# Patient Record
Sex: Female | Born: 1935
Health system: Southern US, Community
[De-identification: ages and names within clinical notes are randomized; demographics above are authoritative.]

## PROBLEM LIST (undated history)

## (undated) DIAGNOSIS — G2581 Restless legs syndrome: Secondary | ICD-10-CM

## (undated) DIAGNOSIS — H9190 Unspecified hearing loss, unspecified ear: Secondary | ICD-10-CM

## (undated) DIAGNOSIS — Z889 Allergy status to unspecified drugs, medicaments and biological substances status: Secondary | ICD-10-CM

## (undated) DIAGNOSIS — R413 Other amnesia: Secondary | ICD-10-CM

## (undated) DIAGNOSIS — K589 Irritable bowel syndrome without diarrhea: Secondary | ICD-10-CM

## (undated) DIAGNOSIS — M1990 Unspecified osteoarthritis, unspecified site: Secondary | ICD-10-CM

## (undated) DIAGNOSIS — R1310 Dysphagia, unspecified: Secondary | ICD-10-CM

## (undated) DIAGNOSIS — H029 Unspecified disorder of eyelid: Secondary | ICD-10-CM

## (undated) DIAGNOSIS — G5600 Carpal tunnel syndrome, unspecified upper limb: Secondary | ICD-10-CM

## (undated) DIAGNOSIS — I10 Essential (primary) hypertension: Secondary | ICD-10-CM

## (undated) DIAGNOSIS — K219 Gastro-esophageal reflux disease without esophagitis: Secondary | ICD-10-CM

## (undated) DIAGNOSIS — G63 Polyneuropathy in diseases classified elsewhere: Secondary | ICD-10-CM

## (undated) DIAGNOSIS — C801 Malignant (primary) neoplasm, unspecified: Secondary | ICD-10-CM

## (undated) DIAGNOSIS — I959 Hypotension, unspecified: Secondary | ICD-10-CM

## (undated) DIAGNOSIS — I4891 Unspecified atrial fibrillation: Secondary | ICD-10-CM

## (undated) DIAGNOSIS — R269 Unspecified abnormalities of gait and mobility: Secondary | ICD-10-CM

## (undated) HISTORY — DX: Irritable bowel syndrome, unspecified: K58.9

## (undated) HISTORY — PX: BLADDER ASPIRATION: SHX472

## (undated) HISTORY — PX: SQUAMOUS CELL CARCINOMA EXCISION: SHX2433

## (undated) HISTORY — PX: EYE SURGERY: SHX253

## (undated) HISTORY — DX: Restless legs syndrome: G25.81

## (undated) HISTORY — DX: Carpal tunnel syndrome, unspecified upper limb: G56.00

## (undated) HISTORY — DX: Polyneuropathy in diseases classified elsewhere: G63

## (undated) HISTORY — DX: Unspecified abnormalities of gait and mobility: R26.9

## (undated) HISTORY — DX: Hypotension, unspecified: I95.9

## (undated) HISTORY — DX: Other amnesia: R41.3

## (undated) HISTORY — PX: CARPAL TUNNEL RELEASE: SHX101

## (undated) HISTORY — DX: Dysphagia, unspecified: R13.10

## (undated) HISTORY — PX: ABDOMINAL HYSTERECTOMY: SHX81

---

## 1989-10-28 HISTORY — PX: OTHER SURGICAL HISTORY: SHX169

## 1998-05-23 ENCOUNTER — Ambulatory Visit (HOSPITAL_COMMUNITY): Admission: RE | Admit: 1998-05-23 | Discharge: 1998-05-23 | Payer: Self-pay | Admitting: Gastroenterology

## 1998-11-22 ENCOUNTER — Encounter: Payer: Self-pay | Admitting: *Deleted

## 1998-11-22 ENCOUNTER — Ambulatory Visit (HOSPITAL_COMMUNITY): Admission: RE | Admit: 1998-11-22 | Discharge: 1998-11-22 | Payer: Self-pay | Admitting: *Deleted

## 1998-11-29 ENCOUNTER — Ambulatory Visit (HOSPITAL_BASED_OUTPATIENT_CLINIC_OR_DEPARTMENT_OTHER): Admission: RE | Admit: 1998-11-29 | Discharge: 1998-11-29 | Payer: Self-pay | Admitting: Orthopedic Surgery

## 1998-12-12 ENCOUNTER — Encounter: Admission: RE | Admit: 1998-12-12 | Discharge: 1999-01-25 | Payer: Self-pay | Admitting: Orthopedic Surgery

## 1999-07-09 ENCOUNTER — Ambulatory Visit (HOSPITAL_COMMUNITY): Admission: RE | Admit: 1999-07-09 | Discharge: 1999-07-09 | Payer: Self-pay

## 2000-06-16 ENCOUNTER — Encounter: Payer: Self-pay | Admitting: Internal Medicine

## 2000-06-16 ENCOUNTER — Encounter: Admission: RE | Admit: 2000-06-16 | Discharge: 2000-06-16 | Payer: Self-pay | Admitting: Internal Medicine

## 2001-01-13 ENCOUNTER — Other Ambulatory Visit: Admission: RE | Admit: 2001-01-13 | Discharge: 2001-01-13 | Payer: Self-pay | Admitting: Internal Medicine

## 2001-06-18 ENCOUNTER — Encounter: Admission: RE | Admit: 2001-06-18 | Discharge: 2001-06-18 | Payer: Self-pay | Admitting: Internal Medicine

## 2001-06-18 ENCOUNTER — Encounter: Payer: Self-pay | Admitting: Internal Medicine

## 2001-09-02 ENCOUNTER — Encounter: Admission: RE | Admit: 2001-09-02 | Discharge: 2001-09-02 | Payer: Self-pay | Admitting: Urology

## 2001-09-02 ENCOUNTER — Encounter: Payer: Self-pay | Admitting: Urology

## 2001-09-03 ENCOUNTER — Ambulatory Visit (HOSPITAL_BASED_OUTPATIENT_CLINIC_OR_DEPARTMENT_OTHER): Admission: RE | Admit: 2001-09-03 | Discharge: 2001-09-03 | Payer: Self-pay | Admitting: Urology

## 2002-06-21 ENCOUNTER — Encounter: Payer: Self-pay | Admitting: Internal Medicine

## 2002-06-21 ENCOUNTER — Encounter: Admission: RE | Admit: 2002-06-21 | Discharge: 2002-06-21 | Payer: Self-pay | Admitting: Internal Medicine

## 2002-12-15 ENCOUNTER — Encounter: Admission: RE | Admit: 2002-12-15 | Discharge: 2002-12-15 | Payer: Self-pay | Admitting: Internal Medicine

## 2002-12-15 ENCOUNTER — Encounter: Payer: Self-pay | Admitting: Internal Medicine

## 2004-01-11 ENCOUNTER — Ambulatory Visit (HOSPITAL_BASED_OUTPATIENT_CLINIC_OR_DEPARTMENT_OTHER): Admission: RE | Admit: 2004-01-11 | Discharge: 2004-01-11 | Payer: Self-pay | Admitting: Internal Medicine

## 2004-08-16 ENCOUNTER — Encounter (INDEPENDENT_AMBULATORY_CARE_PROVIDER_SITE_OTHER): Payer: Self-pay | Admitting: *Deleted

## 2004-08-16 ENCOUNTER — Ambulatory Visit (HOSPITAL_COMMUNITY): Admission: RE | Admit: 2004-08-16 | Discharge: 2004-08-16 | Payer: Self-pay | Admitting: Gastroenterology

## 2004-10-01 ENCOUNTER — Encounter: Admission: RE | Admit: 2004-10-01 | Discharge: 2004-10-01 | Payer: Self-pay | Admitting: Gastroenterology

## 2004-10-09 ENCOUNTER — Ambulatory Visit (HOSPITAL_COMMUNITY): Admission: RE | Admit: 2004-10-09 | Discharge: 2004-10-09 | Payer: Self-pay | Admitting: Gastroenterology

## 2006-07-02 ENCOUNTER — Encounter: Admission: RE | Admit: 2006-07-02 | Discharge: 2006-07-31 | Payer: Self-pay | Admitting: Orthopedic Surgery

## 2006-08-01 ENCOUNTER — Encounter: Admission: RE | Admit: 2006-08-01 | Discharge: 2006-09-02 | Payer: Self-pay | Admitting: Orthopedic Surgery

## 2006-09-03 ENCOUNTER — Encounter: Admission: RE | Admit: 2006-09-03 | Discharge: 2006-12-02 | Payer: Self-pay | Admitting: Orthopedic Surgery

## 2006-11-07 ENCOUNTER — Encounter: Admission: RE | Admit: 2006-11-07 | Discharge: 2006-11-07 | Payer: Self-pay | Admitting: Internal Medicine

## 2007-05-05 ENCOUNTER — Encounter: Admission: RE | Admit: 2007-05-05 | Discharge: 2007-08-03 | Payer: Self-pay | Admitting: Orthopedic Surgery

## 2008-07-25 ENCOUNTER — Encounter: Admission: RE | Admit: 2008-07-25 | Discharge: 2008-07-25 | Payer: Self-pay | Admitting: Orthopedic Surgery

## 2008-07-26 ENCOUNTER — Ambulatory Visit (HOSPITAL_BASED_OUTPATIENT_CLINIC_OR_DEPARTMENT_OTHER): Admission: RE | Admit: 2008-07-26 | Discharge: 2008-07-26 | Payer: Self-pay | Admitting: Orthopedic Surgery

## 2008-09-21 ENCOUNTER — Ambulatory Visit (HOSPITAL_COMMUNITY): Admission: RE | Admit: 2008-09-21 | Discharge: 2008-09-21 | Payer: Self-pay | Admitting: Gastroenterology

## 2009-08-17 ENCOUNTER — Ambulatory Visit (HOSPITAL_COMMUNITY): Admission: RE | Admit: 2009-08-17 | Discharge: 2009-08-17 | Payer: Self-pay | Admitting: Gastroenterology

## 2010-05-14 ENCOUNTER — Encounter: Admission: RE | Admit: 2010-05-14 | Discharge: 2010-05-14 | Payer: Self-pay | Admitting: Gastroenterology

## 2010-05-18 ENCOUNTER — Emergency Department (HOSPITAL_COMMUNITY): Admission: EM | Admit: 2010-05-18 | Discharge: 2010-05-18 | Payer: Self-pay | Admitting: Emergency Medicine

## 2010-06-21 ENCOUNTER — Ambulatory Visit (HOSPITAL_COMMUNITY): Admission: RE | Admit: 2010-06-21 | Discharge: 2010-06-21 | Payer: Self-pay | Admitting: Gastroenterology

## 2011-03-12 NOTE — Op Note (Signed)
NAMESELESTE, TALLMAN                   ACCOUNT NO.:  [id number]   MEDICAL RECORD NO.:  [id number]          PATIENT TYPE:  AMB   LOCATION:  DSC                          FACILITY:  [facility]   PHYSICIAN:  Katy Fitch. Sypher, M.D. DATE OF BIRTH:  09-23-1925   DATE OF PROCEDURE:  07/26/2008  DATE OF DISCHARGE:                               OPERATIVE REPORT   PREOPERATIVE DIAGNOSES:  1. Entrapment neuropathy, median nerve, right carpal tunnel.  2. Entrapment neuropathy, median nerve, left carpal tunnel.   POSTOPERATIVE DIAGNOSES:  1. Entrapment neuropathy, median nerve, right carpal tunnel.  2. Entrapment neuropathy, median nerve, left carpal tunnel.   OPERATION:  1. Release of right transcarpal ligament.  2. Injection of left ulnar bursa for palliation of carpal tunnel      syndrome.   OPERATIONS:  Katy Fitch. Sypher, MD   ASSISTANT:  Marveen Reeks Dasnoit, PA-C   ANESTHESIA:  General by LMA.   SUPERVISING ANESTHESIOLOGIST:  Quita Skye. Krista Blue, MD   INDICATIONS:  Johan Antonacci is an 77 year old woman referred through the  courtesy of Dr. Avie Echevaria for evaluation and management of hand  numbness and confirmed carpal tunnel syndrome.   She had been evaluated at [facility] and had  electrodiagnostic studies confirming severe right carpal tunnel syndrome  and moderately severe left carpal tunnel syndrome.   After informed consent, she is brought to the operating room at this  time.   PROCEDURE:  Vincenzina Jagoda was brought to the operating room and placed in  the supine position upon the operating table.   Preoperatively during interviewed in the holding area, she requested  that we proceed with injection of her left ulnar bursa due to  significant left-sided symptoms.  After informed consent, she was  brought to room #2 and placed in the supine position upon the operating  table and under Dr. Robina Ade direct supervision, general anesthesia by  LMA technique was induced.   The  right arm was prepped with Betadine soap and solution and sterilely  draped.  A pneumatic tourniquet was applied to the proximal right  brachium.  Following exsanguination of right arm with an Esmarch  bandage, the arterial tourniquet was inflated to 250 mmHg.   Procedure commenced with a short incision in the line of the ring finger  in the palm.  The subcutaneous tissues were carefully divided via the  palmar fascia.  This was split longitudinally to reveal a common sensory  branch of the median nerve.  These were followed back to the transcarpal  ligament, which was gently isolated with median nerve using a Child psychotherapist.   Once the transverse carpal ligament was isolated, it was released with  scissors subcutaneously extending into the distal forearm.   This widely opened carpal canal.   No mass or other pigments were noted.   Bleeding points along the margin of the released ligament were  electrocauterized with bipolar current followed by repair of the skin  with intradermal 3-0 Prolene suture.   A compressive dressing was applied.  Attention was then directed to  the  left wrist.   With the fingers in flexion, the ulnar bursa was palpated and a needle  was placed at a 40-degree angle in the line of the ring finger 1.5 cm  above the distal wrist flexion crease.  Needle was aspirated followed by  injection of a mixture of Depo-Medrol 1 mL 40 mg/mL and 2% lidocaine 1  mL.  This was injected without complication in the ulnar bursa.  The  wound was then dressed with a Band-Aid.   The right wrist was immobilized with a volar plaster splint maintaining  the wrist in 5 degrees of dorsiflexion.   There were no apparent complications.   Ms. Carlson was transferred to the recovery room with stable signs.   We looked forward to seeing her back in our office in 1 week for  dressing change and initiation of an excise program.      Katy Fitch. Sypher, M.D.  Electronically  Signed     RVS/MEDQ  D:  07/26/2008  T:  07/27/2008  Job:  [id number]   cc:   Evie Lacks, MD  Georgann Housekeeper, MD

## 2011-03-12 NOTE — Op Note (Signed)
NAMEMERCEDEES, CONVERY NO.:  [id number]   MEDICAL RECORD NO.:  [id number]          PATIENT TYPE:  AMB   LOCATION:  ENDO                         FACILITY:  [facility]   PHYSICIAN:  Danise Edge, M.D.   DATE OF BIRTH:  [birth year]   DATE OF PROCEDURE:  09/21/2008  DATE OF DISCHARGE:                               OPERATIVE REPORT   REFERRING PHYSICIAN:  Georgann Housekeeper, MD   PROCEDURE:  Esophagogastroduodenoscopy with Savary esophageal dilation.   PROCEDURE INDICATIONS:  Stacey Jefferson is an 77 year old female born on  11/11/1924.  Ms. Stacey Jefferson has chronic gastroesophageal reflux  complicated by a benign peptic stricture at the esophagogastric junction  which required esophageal dilation on October 09, 2004.  She has  redeveloped dysphagia and is scheduled to undergo a repeat  esophagogastroduodenoscopy with Savary esophageal dilation.   MEDICATION ALLERGIES:  SULFA, MACRODANTIN, BIAXIN, and CELEBREX.   PAST MEDICAL HISTORY:  1. Hypertension.  2. Anxiety.  3. Chronic insomnia.  4. Restless leg syndrome.  5. Gastroesophageal reflux disease complicated by a benign distal      esophageal stricture.  6. Urinary incontinence.  7. Osteoarthritis.  8. Sensory neuropathy of the legs.  9. Right rotator cuff surgery.  10.Hysterectomy.  11.Foot surgery.  12.Eye surgery.  13.Carpal tunnel syndrome.  14.Irritable bowel syndrome.   HABITS:  Stacey Jefferson does not smoke cigarettes or consume alcohol.   ENDOSCOPIST:  Danise Edge, MD   PREMEDICATION:  Fentanyl 50 mcg and Versed 6 mg.   PROCEDURE IN DETAIL:  After obtaining informed consent, [name] was  placed in the left lateral decubitus position.  I administered  intravenous fentanyl and intravenous Versed to achieve conscious  sedation for the procedure.  The patient's blood pressure, oxygen  saturation, and cardiac rhythm were monitored throughout the procedure  and documented in the medical record.   The Pentax  gastroscope was passed through the posterior hypopharynx and  advanced to the esophagogastric junction without difficulty.  The  hypopharynx, larynx, and vocal cords appeared normal.   Esophagoscopy:  The proximal and mid segments of the esophagus appeared  normal.  There is a tight benign peptic stricture at the esophagogastric  junction preventing passage of the Pentax gastroscope into the stomach.  Under fluoroscopic guidance, the Savary dilator wire was passed through  the gastroscope and the tip of the guidewire advanced to the distal  gastric antrum.  Under fluoroscopic guidance, the 10 mm, 11 mm, and 12.8  mm Savary dilators were passed with minimal resistance.  The guidewire  was removed.  The Pentax gastroscope was passed through the posterior  hypopharynx into the proximal esophagus.  The proximal mid and lower  segments of the esophagus appeared normal.  There was blood at the  dilated distal esophageal stricture.  I was able to traverse the  stricture with the Pentax gastroscope and enter the stomach.  There was  no endoscopic evidence for the presence of erosive esophagitis, Barrett  esophagus, or esophageal cancer.   Gastroscopy:  Retroflex view of the gastric cardia and fundus was  normal.  The diaphragmatic hiatus was somewhat patulous.  The gastric  body, antrum, and pylorus appeared normal.   Duodenoscopy:  The duodenal bulb and descending duodenum appeared  normal.   ASSESSMENT:  Chronic gastroesophageal reflux disease complicated by a  benign peptic stricture at the esophagogastric junction dilated with the  10 mm, 11 mm, and 12.8 mm Savary dilators.           ______________________________  Danise Edge, M.D.     MJ/MEDQ  D:  09/21/2008  T:  09/21/2008  Job:  [id number]   cc:   Georgann Housekeeper, MD

## 2011-03-15 NOTE — Op Note (Signed)
NAMESTEFANNIE, Stacey Jefferson NO.:  [id number]   MEDICAL RECORD NO.:  [id number]          PATIENT TYPE:  AMB   LOCATION:  ENDO                         FACILITY:  [facility]   PHYSICIAN:  Danise Edge, M.D.   DATE OF BIRTH:  [birth year]   DATE OF PROCEDURE:  08/16/2004  DATE OF DISCHARGE:                                 OPERATIVE REPORT   PROCEDURE:  Colonoscopy with polypectomy.   INDICATIONS FOR PROCEDURE:  Ms. Stacey Jefferson is a 77 year old female born  04-28-1925.  She is scheduled to undergo a colonoscopy with  polypectomy.   SURGEON:  Danise Edge, M.D.   PREMEDICATION:  Versed 4.5 mg, Demerol 50 mg.   DESCRIPTION OF PROCEDURE:  After obtaining informed consent, Stacey Jefferson was  placed in the left lateral decubitus position.  I administered intravenous  Demerol and intravenous Versed to achieve conscious sedation for the  procedure.  The patient's blood pressure, oxygen saturation, and cardiac  rhythm were monitored throughout the procedure and documented in the medical  record.   Anal inspection and digital rectal exam were normal.  The Olympus adjustable  pediatric colonoscope was introduced into the rectum and advanced to the  cecum.  The colonic preparation for the exam today was satisfactory.  To get  adequate visualization of the mucosa, vigorous water irrigation of the  mucosa was performed.   Rectum:  Normal.   Sigmoid colon and descending colon:  At 45 cm from the anal verge, a 3-mm  sessile polyp was removed with the electrocautery snare and submitted for  pathological interpretation.   Splenic flexure:  Normal.   Transverse colon:  Normal.   Hepatic flexure:  Normal.   Ascending colon:  Normal.   Cecum and ileocecal valve:  Normal.   ASSESSMENT:  A small polyp was removed from the sigmoid colon at 45 cm from  the anal verge; otherwise, normal proctocolonoscopy to the cecum.      MJ/MEDQ  D:  08/16/2004  T:  08/16/2004  Job:  [id number]   cc:   Georgann Housekeeper, MD  [address]  [place]  [place] [address]  Fax: [phone]   Genene Churn. Love, M.D.  [address]  [place]  [place] [address]  Fax: [phone]

## 2011-03-15 NOTE — Op Note (Signed)
[facility]  Patient:    Stacey Jefferson, Stacey Jefferson Visit Number: [id number] MRN: [id number]          Service Type: NES Location: NESC Attending Physician:  Monica Becton Dictated by:   Claudette Laws, M.D. Proc. Date: 09/03/01 Admit Date:  09/03/2001                             Operative Report  PREOPERATIVE DIAGNOSIS:  Urinary incontinence.  POSTOPERATIVE DIAGNOSIS:  Urinary incontinence.  OPERATION/PROCEDURE:  Cystoscopy and transurethral injection bladder neck with Collagen (2 syringes).  SURGEON:  Claudette Laws, M.D.  DESCRIPTION OF PROCEDURE:  The patient was prepped and draped in the dorsolithotomy position under LMA anesthesia.  Cystoscopy was performed showing a normal appearing bladder.  No tumors, normal ureteral orifices, no suspicious areas.  She had a rather wide-open bladder neck.  Then using both the Olympus transurethral needle and the Bard collagens cannula stabilizing needle through a 25 French Olympus cystoscope, transurethral incision of the bladder neck with collagen was performed in a circumferential manner using 2 syringefuls of the collagen.  This seemed to coapt the bladder neck rather nicely.  We then emptied the bladder with the 14 French Robinson catheter and she was taken back to the recovery room in satisfactory condition. Dictated by:   Claudette Laws, M.D. Attending Physician:  Monica Becton DD:  09/03/01 TD:  09/04/01 Job: [id number]

## 2011-03-15 NOTE — Op Note (Signed)
NAMEFRANCESCA, Jefferson NO.:  [id number]   MEDICAL RECORD NO.:  [id number]          PATIENT TYPE:  AMB   LOCATION:  ENDO                         FACILITY:  [facility]   PHYSICIAN:  Danise Edge, M.D.   DATE OF BIRTH:  12-Nov-1924   DATE OF PROCEDURE:  10/09/2004  DATE OF DISCHARGE:                                 OPERATIVE REPORT   PROCEDURE:  Esophageal dilation.   INDICATIONS FOR PROCEDURE:  Stacey Jefferson is a 77 year old female born  11-10-1924.  Stacey Jefferson denies heartburn.  When she developed solid  food dysphagia, she underwent a barium tablet swallow which revealed a  distal esophageal stricture estimated to be 12 mm in diameter.  She last  underwent esophageal dilation for a benign peptic stricture at the  esophagogastric junction in 1999.   ENDOSCOPIST:  Danise Edge, M.D.   PREMEDICATION:  Versed 5 mg, Demerol 40 mg.   DESCRIPTION OF PROCEDURE:  After obtaining informed consent, Stacey Jefferson was  placed in the left lateral decubitus position.  I administered intravenous  Demerol and intravenous Versed to achieve conscious sedation for the  procedure.  The patient's blood pressure, oxygen saturation, and cardiac  rhythm were monitored throughout the procedure and documented in the medical  record.   The Olympus gastroscope was passed through the posterior hypopharynx into  the proximal esophagus without difficulty.  The hypopharynx, larynx, and  vocal cords appeared normal.   Esophagoscopy:  The proximal and midsegments of the esophagus appeared  normal.  There was a benign peptic stricture at the esophagogastric  junction.  There was no endoscopic evidence for the presence of erosive  esophagitis, Barrett's esophagus, or esophageal cancer.   Gastroscopy:  Stacey Jefferson had a small hiatal hernia.  Retroflex view of the  gastric cardia and fundus was normal.  The gastric body, antrum, and pylorus  appeared normal.   Duodenoscopy:  The duodenal bulb and  descending duodenum appeared normal.   Savary esophageal dilation:  The Savary dilator wire was passed through the  endoscope, and the tip of the guidewire was advanced to the distal gastric  antrum as confirmed endoscopically.  Fluoroscopy was not required for  esophageal dilation.  The 10-mm, 11-mm, and 12-mm Savary dilators were  passed over the guidewire without resistance.  After passage of the 12-mm  Savary dilator, I repeated esophagogastroscopy which confirmed dilation of  the benign peptic stricture at the esophagogastric junction and no gastric  trauma due to the guidewire.  It appeared safe to continue on with two more  Savary dilators.  The guidewire was replaced, and the 14-mm and 15-mm Savary  dilators were passed without resistance.  Repeat esophagogastroscopy  confirmed satisfactory dilation of the benign peptic stricture at the  esophagogastric junction and no gastric trauma due to the guidewire.   ASSESSMENT:  Benign peptic stricture at the esophagogastric junction  associated with a hiatal hernia, dilated with a 10-mm, 11-mm, 12-mm, 14-mm,  and 15-mm Savary dilators.      MJ/MEDQ  D:  10/09/2004  T:  10/09/2004  Job:  [id number]   cc:   Georgann Housekeeper, MD  [address]  [place]  [place] [address]  Fax: [phone]

## 2011-04-05 ENCOUNTER — Ambulatory Visit: Payer: Medicare Other | Attending: Ophthalmology | Admitting: Occupational Therapy

## 2011-04-05 DIAGNOSIS — R5381 Other malaise: Secondary | ICD-10-CM | POA: Insufficient documentation

## 2011-04-05 DIAGNOSIS — R6889 Other general symptoms and signs: Secondary | ICD-10-CM | POA: Insufficient documentation

## 2011-04-05 DIAGNOSIS — H4420 Degenerative myopia, unspecified eye: Secondary | ICD-10-CM | POA: Insufficient documentation

## 2011-04-05 DIAGNOSIS — IMO0001 Reserved for inherently not codable concepts without codable children: Secondary | ICD-10-CM | POA: Insufficient documentation

## 2011-07-25 ENCOUNTER — Ambulatory Visit (HOSPITAL_COMMUNITY)
Admission: RE | Admit: 2011-07-25 | Discharge: 2011-07-25 | Disposition: A | Discharge status: Home / Self Care | Payer: Medicare Other | Source: Ambulatory Visit | Attending: Gastroenterology | Admitting: Gastroenterology

## 2011-07-25 ENCOUNTER — Other Ambulatory Visit: Payer: Self-pay | Admitting: Gastroenterology

## 2011-07-25 DIAGNOSIS — K222 Esophageal obstruction: Secondary | ICD-10-CM | POA: Insufficient documentation

## 2011-07-25 DIAGNOSIS — K219 Gastro-esophageal reflux disease without esophagitis: Secondary | ICD-10-CM | POA: Insufficient documentation

## 2011-07-25 DIAGNOSIS — Z860100 Personal history of colon polyps, unspecified: Secondary | ICD-10-CM | POA: Insufficient documentation

## 2011-07-25 DIAGNOSIS — I10 Essential (primary) hypertension: Secondary | ICD-10-CM | POA: Insufficient documentation

## 2011-07-25 DIAGNOSIS — K589 Irritable bowel syndrome without diarrhea: Secondary | ICD-10-CM | POA: Insufficient documentation

## 2011-07-25 DIAGNOSIS — G589 Mononeuropathy, unspecified: Secondary | ICD-10-CM | POA: Insufficient documentation

## 2011-07-25 DIAGNOSIS — F0390 Unspecified dementia without behavioral disturbance: Secondary | ICD-10-CM | POA: Insufficient documentation

## 2011-07-25 DIAGNOSIS — R1310 Dysphagia, unspecified: Secondary | ICD-10-CM | POA: Insufficient documentation

## 2011-07-25 DIAGNOSIS — K2960 Other gastritis without bleeding: Secondary | ICD-10-CM | POA: Insufficient documentation

## 2011-07-25 DIAGNOSIS — M1990 Unspecified osteoarthritis, unspecified site: Secondary | ICD-10-CM | POA: Insufficient documentation

## 2011-07-25 DIAGNOSIS — J309 Allergic rhinitis, unspecified: Secondary | ICD-10-CM | POA: Insufficient documentation

## 2011-07-25 DIAGNOSIS — G2581 Restless legs syndrome: Secondary | ICD-10-CM | POA: Insufficient documentation

## 2011-07-28 NOTE — Op Note (Signed)
  NAMEMARISELDA, BADALAMENTI NO.:  [id number]  MEDICAL RECORD NO.:  [id number]  LOCATION:  WLEN                         FACILITY:  [facility]  PHYSICIAN:  Danise Edge, M.D.   DATE OF BIRTH:  [birth year]  DATE OF PROCEDURE:  07/25/2011 DATE OF DISCHARGE:                              OPERATIVE REPORT   REFERRING PHYSICIAN:  Georgann Housekeeper, MD  PROCEDURE:  Esophagogastroduodenoscopy with balloon dilation of a distal esophageal stricture and distal esophageal biopsy.  HISTORY:  Ms. Stacey Jefferson is an 77 year old female born on [birth year].  The patient has a benign peptic stricture at the esophagogastric junction which has required multiple esophageal dilations.  ENDOSCOPIST:  Danise Edge, MD  PREMEDICATION:  Versed 3 mg, fentanyl 25 mcg.  PROCEDURE IN DETAIL:  The patient was placed in the left lateral decubitus position.  The Pentax gastroscope was passed through the posterior hypopharynx into the proximal esophagus without difficulty. The hypopharynx, larynx, and vocal cords appeared normal.  Esophagoscopy:  The proximal mid segments of the esophagus appeared normal.  The squamocolumnar junction was noted at 40 cm from the incisor teeth.  The benign peptic stricture extends from approximately 37 cm to 40 cm from the incisor teeth.  Using the CRE balloon dilator, the benign- appearing distal esophageal stricture was dilated from 13 mm to 15 mm. Postesophageal balloon dilation biopsies were taken from the esophageal stricture.  Gastroscopy:  Retroflex view of the gastric cardia and fundus was normal.  The gastric body appeared normal.  There were scattered circular erosions in the gastric antrum.  The pylorus appeared normal.  Duodenoscopy:  The duodenal bulb and descending duodenum appeared normal.  ASSESSMENT:  Benign distal esophageal stricture dilated with the CRE balloon dilator from 13 mm to 15 mm.  Esophageal stricture biopsies  were performed.          ______________________________ Danise Edge, M.D.    MJ/MEDQ  D:  07/25/2011  T:  07/25/2011  Job:  [id number]  Electronically Signed by Danise Edge M.D. on 07/28/2011 01:44:41 PM

## 2011-07-29 LAB — BASIC METABOLIC PANEL
BUN: 15
Chloride: 101
Potassium: 4.6

## 2011-07-29 LAB — POCT HEMOGLOBIN-HEMACUE: Hemoglobin: 13.1

## 2011-11-26 DIAGNOSIS — M1710 Unilateral primary osteoarthritis, unspecified knee: Secondary | ICD-10-CM | POA: Diagnosis not present

## 2011-12-05 DIAGNOSIS — M75120 Complete rotator cuff tear or rupture of unspecified shoulder, not specified as traumatic: Secondary | ICD-10-CM | POA: Diagnosis not present

## 2011-12-05 DIAGNOSIS — M1710 Unilateral primary osteoarthritis, unspecified knee: Secondary | ICD-10-CM | POA: Diagnosis not present

## 2011-12-05 DIAGNOSIS — S43429A Sprain of unspecified rotator cuff capsule, initial encounter: Secondary | ICD-10-CM | POA: Diagnosis not present

## 2011-12-13 DIAGNOSIS — M1710 Unilateral primary osteoarthritis, unspecified knee: Secondary | ICD-10-CM | POA: Diagnosis not present

## 2011-12-17 DIAGNOSIS — M1990 Unspecified osteoarthritis, unspecified site: Secondary | ICD-10-CM | POA: Diagnosis not present

## 2011-12-17 DIAGNOSIS — G609 Hereditary and idiopathic neuropathy, unspecified: Secondary | ICD-10-CM | POA: Diagnosis not present

## 2011-12-17 DIAGNOSIS — G2589 Other specified extrapyramidal and movement disorders: Secondary | ICD-10-CM | POA: Diagnosis not present

## 2011-12-17 DIAGNOSIS — I10 Essential (primary) hypertension: Secondary | ICD-10-CM | POA: Diagnosis not present

## 2011-12-17 DIAGNOSIS — M899 Disorder of bone, unspecified: Secondary | ICD-10-CM | POA: Diagnosis not present

## 2011-12-17 DIAGNOSIS — Z1331 Encounter for screening for depression: Secondary | ICD-10-CM | POA: Diagnosis not present

## 2011-12-17 DIAGNOSIS — K219 Gastro-esophageal reflux disease without esophagitis: Secondary | ICD-10-CM | POA: Diagnosis not present

## 2011-12-17 DIAGNOSIS — F0390 Unspecified dementia without behavioral disturbance: Secondary | ICD-10-CM | POA: Diagnosis not present

## 2011-12-24 DIAGNOSIS — G63 Polyneuropathy in diseases classified elsewhere: Secondary | ICD-10-CM | POA: Diagnosis not present

## 2012-03-11 DIAGNOSIS — K222 Esophageal obstruction: Secondary | ICD-10-CM | POA: Diagnosis not present

## 2012-03-11 DIAGNOSIS — R1310 Dysphagia, unspecified: Secondary | ICD-10-CM | POA: Diagnosis not present

## 2012-03-31 DIAGNOSIS — M1710 Unilateral primary osteoarthritis, unspecified knee: Secondary | ICD-10-CM | POA: Diagnosis not present

## 2012-04-14 DIAGNOSIS — G2589 Other specified extrapyramidal and movement disorders: Secondary | ICD-10-CM | POA: Diagnosis not present

## 2012-04-14 DIAGNOSIS — M899 Disorder of bone, unspecified: Secondary | ICD-10-CM | POA: Diagnosis not present

## 2012-04-14 DIAGNOSIS — F0390 Unspecified dementia without behavioral disturbance: Secondary | ICD-10-CM | POA: Diagnosis not present

## 2012-04-14 DIAGNOSIS — K219 Gastro-esophageal reflux disease without esophagitis: Secondary | ICD-10-CM | POA: Diagnosis not present

## 2012-04-14 DIAGNOSIS — I10 Essential (primary) hypertension: Secondary | ICD-10-CM | POA: Diagnosis not present

## 2012-04-14 DIAGNOSIS — R0602 Shortness of breath: Secondary | ICD-10-CM | POA: Diagnosis not present

## 2012-04-14 DIAGNOSIS — M949 Disorder of cartilage, unspecified: Secondary | ICD-10-CM | POA: Diagnosis not present

## 2012-05-06 DIAGNOSIS — I10 Essential (primary) hypertension: Secondary | ICD-10-CM | POA: Diagnosis not present

## 2012-05-06 DIAGNOSIS — R609 Edema, unspecified: Secondary | ICD-10-CM | POA: Diagnosis not present

## 2012-05-06 DIAGNOSIS — R0989 Other specified symptoms and signs involving the circulatory and respiratory systems: Secondary | ICD-10-CM | POA: Diagnosis not present

## 2012-05-06 DIAGNOSIS — R0609 Other forms of dyspnea: Secondary | ICD-10-CM | POA: Diagnosis not present

## 2012-06-03 DIAGNOSIS — L821 Other seborrheic keratosis: Secondary | ICD-10-CM | POA: Diagnosis not present

## 2012-06-03 DIAGNOSIS — L0889 Other specified local infections of the skin and subcutaneous tissue: Secondary | ICD-10-CM | POA: Diagnosis not present

## 2012-06-03 DIAGNOSIS — D485 Neoplasm of uncertain behavior of skin: Secondary | ICD-10-CM | POA: Diagnosis not present

## 2012-06-03 DIAGNOSIS — C44721 Squamous cell carcinoma of skin of unspecified lower limb, including hip: Secondary | ICD-10-CM | POA: Diagnosis not present

## 2012-06-03 DIAGNOSIS — I872 Venous insufficiency (chronic) (peripheral): Secondary | ICD-10-CM | POA: Diagnosis not present

## 2012-06-16 DIAGNOSIS — M1710 Unilateral primary osteoarthritis, unspecified knee: Secondary | ICD-10-CM | POA: Diagnosis not present

## 2012-06-22 DIAGNOSIS — C44721 Squamous cell carcinoma of skin of unspecified lower limb, including hip: Secondary | ICD-10-CM | POA: Diagnosis not present

## 2012-06-22 DIAGNOSIS — L905 Scar conditions and fibrosis of skin: Secondary | ICD-10-CM | POA: Diagnosis not present

## 2012-06-23 DIAGNOSIS — G2581 Restless legs syndrome: Secondary | ICD-10-CM | POA: Diagnosis not present

## 2012-06-23 DIAGNOSIS — G63 Polyneuropathy in diseases classified elsewhere: Secondary | ICD-10-CM | POA: Diagnosis not present

## 2012-06-23 DIAGNOSIS — R413 Other amnesia: Secondary | ICD-10-CM | POA: Diagnosis not present

## 2012-06-25 DIAGNOSIS — M1710 Unilateral primary osteoarthritis, unspecified knee: Secondary | ICD-10-CM | POA: Diagnosis not present

## 2012-07-02 DIAGNOSIS — M1710 Unilateral primary osteoarthritis, unspecified knee: Secondary | ICD-10-CM | POA: Diagnosis not present

## 2012-07-17 DIAGNOSIS — J189 Pneumonia, unspecified organism: Secondary | ICD-10-CM | POA: Diagnosis not present

## 2012-07-21 DIAGNOSIS — J209 Acute bronchitis, unspecified: Secondary | ICD-10-CM | POA: Diagnosis not present

## 2012-07-28 DIAGNOSIS — L608 Other nail disorders: Secondary | ICD-10-CM | POA: Diagnosis not present

## 2012-07-28 DIAGNOSIS — R05 Cough: Secondary | ICD-10-CM | POA: Diagnosis not present

## 2012-07-28 DIAGNOSIS — G609 Hereditary and idiopathic neuropathy, unspecified: Secondary | ICD-10-CM | POA: Diagnosis not present

## 2012-07-28 DIAGNOSIS — L84 Corns and callosities: Secondary | ICD-10-CM | POA: Diagnosis not present

## 2012-08-11 DIAGNOSIS — Z23 Encounter for immunization: Secondary | ICD-10-CM | POA: Diagnosis not present

## 2012-08-11 DIAGNOSIS — G609 Hereditary and idiopathic neuropathy, unspecified: Secondary | ICD-10-CM | POA: Diagnosis not present

## 2012-08-11 DIAGNOSIS — K219 Gastro-esophageal reflux disease without esophagitis: Secondary | ICD-10-CM | POA: Diagnosis not present

## 2012-08-11 DIAGNOSIS — I10 Essential (primary) hypertension: Secondary | ICD-10-CM | POA: Diagnosis not present

## 2012-08-11 DIAGNOSIS — G2589 Other specified extrapyramidal and movement disorders: Secondary | ICD-10-CM | POA: Diagnosis not present

## 2012-08-11 DIAGNOSIS — F0390 Unspecified dementia without behavioral disturbance: Secondary | ICD-10-CM | POA: Diagnosis not present

## 2012-08-26 DIAGNOSIS — K589 Irritable bowel syndrome without diarrhea: Secondary | ICD-10-CM | POA: Diagnosis not present

## 2012-08-26 DIAGNOSIS — R197 Diarrhea, unspecified: Secondary | ICD-10-CM | POA: Diagnosis not present

## 2012-09-02 DIAGNOSIS — K222 Esophageal obstruction: Secondary | ICD-10-CM | POA: Diagnosis not present

## 2012-09-08 DIAGNOSIS — L820 Inflamed seborrheic keratosis: Secondary | ICD-10-CM | POA: Diagnosis not present

## 2012-09-08 DIAGNOSIS — L570 Actinic keratosis: Secondary | ICD-10-CM | POA: Diagnosis not present

## 2012-09-08 DIAGNOSIS — D485 Neoplasm of uncertain behavior of skin: Secondary | ICD-10-CM | POA: Diagnosis not present

## 2012-09-08 DIAGNOSIS — Z85828 Personal history of other malignant neoplasm of skin: Secondary | ICD-10-CM | POA: Diagnosis not present

## 2012-09-08 DIAGNOSIS — D0470 Carcinoma in situ of skin of unspecified lower limb, including hip: Secondary | ICD-10-CM | POA: Diagnosis not present

## 2012-09-22 DIAGNOSIS — D0470 Carcinoma in situ of skin of unspecified lower limb, including hip: Secondary | ICD-10-CM | POA: Diagnosis not present

## 2012-09-22 DIAGNOSIS — L89309 Pressure ulcer of unspecified buttock, unspecified stage: Secondary | ICD-10-CM | POA: Diagnosis not present

## 2012-09-30 DIAGNOSIS — I10 Essential (primary) hypertension: Secondary | ICD-10-CM | POA: Diagnosis not present

## 2012-09-30 DIAGNOSIS — K219 Gastro-esophageal reflux disease without esophagitis: Secondary | ICD-10-CM | POA: Diagnosis not present

## 2012-09-30 DIAGNOSIS — R32 Unspecified urinary incontinence: Secondary | ICD-10-CM | POA: Diagnosis not present

## 2012-09-30 DIAGNOSIS — M1990 Unspecified osteoarthritis, unspecified site: Secondary | ICD-10-CM | POA: Diagnosis not present

## 2012-09-30 DIAGNOSIS — R1310 Dysphagia, unspecified: Secondary | ICD-10-CM | POA: Diagnosis not present

## 2012-09-30 DIAGNOSIS — R269 Unspecified abnormalities of gait and mobility: Secondary | ICD-10-CM | POA: Diagnosis not present

## 2012-10-01 DIAGNOSIS — R269 Unspecified abnormalities of gait and mobility: Secondary | ICD-10-CM | POA: Diagnosis not present

## 2012-10-01 DIAGNOSIS — R1314 Dysphagia, pharyngoesophageal phase: Secondary | ICD-10-CM | POA: Diagnosis not present

## 2012-10-01 DIAGNOSIS — I10 Essential (primary) hypertension: Secondary | ICD-10-CM | POA: Diagnosis not present

## 2012-10-01 DIAGNOSIS — M1990 Unspecified osteoarthritis, unspecified site: Secondary | ICD-10-CM | POA: Diagnosis not present

## 2012-10-01 DIAGNOSIS — R32 Unspecified urinary incontinence: Secondary | ICD-10-CM | POA: Diagnosis not present

## 2012-10-01 DIAGNOSIS — R1310 Dysphagia, unspecified: Secondary | ICD-10-CM | POA: Diagnosis not present

## 2012-10-01 DIAGNOSIS — K219 Gastro-esophageal reflux disease without esophagitis: Secondary | ICD-10-CM | POA: Diagnosis not present

## 2012-10-02 DIAGNOSIS — R32 Unspecified urinary incontinence: Secondary | ICD-10-CM | POA: Diagnosis not present

## 2012-10-02 DIAGNOSIS — R1310 Dysphagia, unspecified: Secondary | ICD-10-CM | POA: Diagnosis not present

## 2012-10-02 DIAGNOSIS — M1990 Unspecified osteoarthritis, unspecified site: Secondary | ICD-10-CM | POA: Diagnosis not present

## 2012-10-02 DIAGNOSIS — K219 Gastro-esophageal reflux disease without esophagitis: Secondary | ICD-10-CM | POA: Diagnosis not present

## 2012-10-02 DIAGNOSIS — R269 Unspecified abnormalities of gait and mobility: Secondary | ICD-10-CM | POA: Diagnosis not present

## 2012-10-02 DIAGNOSIS — I10 Essential (primary) hypertension: Secondary | ICD-10-CM | POA: Diagnosis not present

## 2012-10-05 DIAGNOSIS — R1310 Dysphagia, unspecified: Secondary | ICD-10-CM | POA: Diagnosis not present

## 2012-10-05 DIAGNOSIS — K219 Gastro-esophageal reflux disease without esophagitis: Secondary | ICD-10-CM | POA: Diagnosis not present

## 2012-10-05 DIAGNOSIS — I10 Essential (primary) hypertension: Secondary | ICD-10-CM | POA: Diagnosis not present

## 2012-10-05 DIAGNOSIS — R269 Unspecified abnormalities of gait and mobility: Secondary | ICD-10-CM | POA: Diagnosis not present

## 2012-10-05 DIAGNOSIS — R32 Unspecified urinary incontinence: Secondary | ICD-10-CM | POA: Diagnosis not present

## 2012-10-05 DIAGNOSIS — M1990 Unspecified osteoarthritis, unspecified site: Secondary | ICD-10-CM | POA: Diagnosis not present

## 2012-10-07 DIAGNOSIS — M1990 Unspecified osteoarthritis, unspecified site: Secondary | ICD-10-CM | POA: Diagnosis not present

## 2012-10-07 DIAGNOSIS — R32 Unspecified urinary incontinence: Secondary | ICD-10-CM | POA: Diagnosis not present

## 2012-10-07 DIAGNOSIS — R1310 Dysphagia, unspecified: Secondary | ICD-10-CM | POA: Diagnosis not present

## 2012-10-07 DIAGNOSIS — R269 Unspecified abnormalities of gait and mobility: Secondary | ICD-10-CM | POA: Diagnosis not present

## 2012-10-07 DIAGNOSIS — I10 Essential (primary) hypertension: Secondary | ICD-10-CM | POA: Diagnosis not present

## 2012-10-07 DIAGNOSIS — K219 Gastro-esophageal reflux disease without esophagitis: Secondary | ICD-10-CM | POA: Diagnosis not present

## 2012-10-08 DIAGNOSIS — M1990 Unspecified osteoarthritis, unspecified site: Secondary | ICD-10-CM | POA: Diagnosis not present

## 2012-10-08 DIAGNOSIS — R32 Unspecified urinary incontinence: Secondary | ICD-10-CM | POA: Diagnosis not present

## 2012-10-08 DIAGNOSIS — R269 Unspecified abnormalities of gait and mobility: Secondary | ICD-10-CM | POA: Diagnosis not present

## 2012-10-08 DIAGNOSIS — R1310 Dysphagia, unspecified: Secondary | ICD-10-CM | POA: Diagnosis not present

## 2012-10-08 DIAGNOSIS — I10 Essential (primary) hypertension: Secondary | ICD-10-CM | POA: Diagnosis not present

## 2012-10-08 DIAGNOSIS — K219 Gastro-esophageal reflux disease without esophagitis: Secondary | ICD-10-CM | POA: Diagnosis not present

## 2012-10-09 DIAGNOSIS — M1990 Unspecified osteoarthritis, unspecified site: Secondary | ICD-10-CM | POA: Diagnosis not present

## 2012-10-09 DIAGNOSIS — R269 Unspecified abnormalities of gait and mobility: Secondary | ICD-10-CM | POA: Diagnosis not present

## 2012-10-09 DIAGNOSIS — K219 Gastro-esophageal reflux disease without esophagitis: Secondary | ICD-10-CM | POA: Diagnosis not present

## 2012-10-09 DIAGNOSIS — R32 Unspecified urinary incontinence: Secondary | ICD-10-CM | POA: Diagnosis not present

## 2012-10-09 DIAGNOSIS — R1310 Dysphagia, unspecified: Secondary | ICD-10-CM | POA: Diagnosis not present

## 2012-10-09 DIAGNOSIS — I10 Essential (primary) hypertension: Secondary | ICD-10-CM | POA: Diagnosis not present

## 2012-10-12 DIAGNOSIS — M1990 Unspecified osteoarthritis, unspecified site: Secondary | ICD-10-CM | POA: Diagnosis not present

## 2012-10-12 DIAGNOSIS — R269 Unspecified abnormalities of gait and mobility: Secondary | ICD-10-CM | POA: Diagnosis not present

## 2012-10-12 DIAGNOSIS — I10 Essential (primary) hypertension: Secondary | ICD-10-CM | POA: Diagnosis not present

## 2012-10-12 DIAGNOSIS — K219 Gastro-esophageal reflux disease without esophagitis: Secondary | ICD-10-CM | POA: Diagnosis not present

## 2012-10-12 DIAGNOSIS — R1310 Dysphagia, unspecified: Secondary | ICD-10-CM | POA: Diagnosis not present

## 2012-10-12 DIAGNOSIS — R32 Unspecified urinary incontinence: Secondary | ICD-10-CM | POA: Diagnosis not present

## 2012-10-14 DIAGNOSIS — K219 Gastro-esophageal reflux disease without esophagitis: Secondary | ICD-10-CM | POA: Diagnosis not present

## 2012-10-14 DIAGNOSIS — R32 Unspecified urinary incontinence: Secondary | ICD-10-CM | POA: Diagnosis not present

## 2012-10-14 DIAGNOSIS — R269 Unspecified abnormalities of gait and mobility: Secondary | ICD-10-CM | POA: Diagnosis not present

## 2012-10-14 DIAGNOSIS — M1990 Unspecified osteoarthritis, unspecified site: Secondary | ICD-10-CM | POA: Diagnosis not present

## 2012-10-14 DIAGNOSIS — I10 Essential (primary) hypertension: Secondary | ICD-10-CM | POA: Diagnosis not present

## 2012-10-14 DIAGNOSIS — R1310 Dysphagia, unspecified: Secondary | ICD-10-CM | POA: Diagnosis not present

## 2012-10-16 DIAGNOSIS — K219 Gastro-esophageal reflux disease without esophagitis: Secondary | ICD-10-CM | POA: Diagnosis not present

## 2012-10-16 DIAGNOSIS — R269 Unspecified abnormalities of gait and mobility: Secondary | ICD-10-CM | POA: Diagnosis not present

## 2012-10-16 DIAGNOSIS — M1990 Unspecified osteoarthritis, unspecified site: Secondary | ICD-10-CM | POA: Diagnosis not present

## 2012-10-16 DIAGNOSIS — I10 Essential (primary) hypertension: Secondary | ICD-10-CM | POA: Diagnosis not present

## 2012-10-16 DIAGNOSIS — R1310 Dysphagia, unspecified: Secondary | ICD-10-CM | POA: Diagnosis not present

## 2012-10-16 DIAGNOSIS — R32 Unspecified urinary incontinence: Secondary | ICD-10-CM | POA: Diagnosis not present

## 2012-10-19 DIAGNOSIS — R32 Unspecified urinary incontinence: Secondary | ICD-10-CM | POA: Diagnosis not present

## 2012-10-19 DIAGNOSIS — M1990 Unspecified osteoarthritis, unspecified site: Secondary | ICD-10-CM | POA: Diagnosis not present

## 2012-10-19 DIAGNOSIS — K219 Gastro-esophageal reflux disease without esophagitis: Secondary | ICD-10-CM | POA: Diagnosis not present

## 2012-10-19 DIAGNOSIS — R1310 Dysphagia, unspecified: Secondary | ICD-10-CM | POA: Diagnosis not present

## 2012-10-19 DIAGNOSIS — R269 Unspecified abnormalities of gait and mobility: Secondary | ICD-10-CM | POA: Diagnosis not present

## 2012-10-19 DIAGNOSIS — I10 Essential (primary) hypertension: Secondary | ICD-10-CM | POA: Diagnosis not present

## 2012-10-22 DIAGNOSIS — G63 Polyneuropathy in diseases classified elsewhere: Secondary | ICD-10-CM | POA: Diagnosis not present

## 2012-10-22 DIAGNOSIS — R1310 Dysphagia, unspecified: Secondary | ICD-10-CM | POA: Diagnosis not present

## 2012-10-22 DIAGNOSIS — R1110 Vomiting, unspecified: Secondary | ICD-10-CM | POA: Diagnosis not present

## 2012-10-22 DIAGNOSIS — R634 Abnormal weight loss: Secondary | ICD-10-CM | POA: Diagnosis not present

## 2012-10-23 DIAGNOSIS — M1990 Unspecified osteoarthritis, unspecified site: Secondary | ICD-10-CM | POA: Diagnosis not present

## 2012-10-23 DIAGNOSIS — K219 Gastro-esophageal reflux disease without esophagitis: Secondary | ICD-10-CM | POA: Diagnosis not present

## 2012-10-23 DIAGNOSIS — R269 Unspecified abnormalities of gait and mobility: Secondary | ICD-10-CM | POA: Diagnosis not present

## 2012-10-23 DIAGNOSIS — I10 Essential (primary) hypertension: Secondary | ICD-10-CM | POA: Diagnosis not present

## 2012-10-23 DIAGNOSIS — R1310 Dysphagia, unspecified: Secondary | ICD-10-CM | POA: Diagnosis not present

## 2012-10-23 DIAGNOSIS — R32 Unspecified urinary incontinence: Secondary | ICD-10-CM | POA: Diagnosis not present

## 2012-10-26 DIAGNOSIS — M1990 Unspecified osteoarthritis, unspecified site: Secondary | ICD-10-CM | POA: Diagnosis not present

## 2012-10-26 DIAGNOSIS — I10 Essential (primary) hypertension: Secondary | ICD-10-CM | POA: Diagnosis not present

## 2012-10-26 DIAGNOSIS — R269 Unspecified abnormalities of gait and mobility: Secondary | ICD-10-CM | POA: Diagnosis not present

## 2012-10-26 DIAGNOSIS — R32 Unspecified urinary incontinence: Secondary | ICD-10-CM | POA: Diagnosis not present

## 2012-10-26 DIAGNOSIS — K219 Gastro-esophageal reflux disease without esophagitis: Secondary | ICD-10-CM | POA: Diagnosis not present

## 2012-10-26 DIAGNOSIS — R1310 Dysphagia, unspecified: Secondary | ICD-10-CM | POA: Diagnosis not present

## 2012-10-27 DIAGNOSIS — I10 Essential (primary) hypertension: Secondary | ICD-10-CM | POA: Diagnosis not present

## 2012-10-27 DIAGNOSIS — R269 Unspecified abnormalities of gait and mobility: Secondary | ICD-10-CM | POA: Diagnosis not present

## 2012-10-27 DIAGNOSIS — M1990 Unspecified osteoarthritis, unspecified site: Secondary | ICD-10-CM | POA: Diagnosis not present

## 2012-10-27 DIAGNOSIS — R1310 Dysphagia, unspecified: Secondary | ICD-10-CM | POA: Diagnosis not present

## 2012-10-27 DIAGNOSIS — R32 Unspecified urinary incontinence: Secondary | ICD-10-CM | POA: Diagnosis not present

## 2012-10-27 DIAGNOSIS — K219 Gastro-esophageal reflux disease without esophagitis: Secondary | ICD-10-CM | POA: Diagnosis not present

## 2012-10-30 DIAGNOSIS — R269 Unspecified abnormalities of gait and mobility: Secondary | ICD-10-CM | POA: Diagnosis not present

## 2012-10-30 DIAGNOSIS — R32 Unspecified urinary incontinence: Secondary | ICD-10-CM | POA: Diagnosis not present

## 2012-10-30 DIAGNOSIS — R1310 Dysphagia, unspecified: Secondary | ICD-10-CM | POA: Diagnosis not present

## 2012-10-30 DIAGNOSIS — K219 Gastro-esophageal reflux disease without esophagitis: Secondary | ICD-10-CM | POA: Diagnosis not present

## 2012-10-30 DIAGNOSIS — M1990 Unspecified osteoarthritis, unspecified site: Secondary | ICD-10-CM | POA: Diagnosis not present

## 2012-10-30 DIAGNOSIS — I10 Essential (primary) hypertension: Secondary | ICD-10-CM | POA: Diagnosis not present

## 2012-11-03 DIAGNOSIS — R269 Unspecified abnormalities of gait and mobility: Secondary | ICD-10-CM | POA: Diagnosis not present

## 2012-11-03 DIAGNOSIS — M1990 Unspecified osteoarthritis, unspecified site: Secondary | ICD-10-CM | POA: Diagnosis not present

## 2012-11-03 DIAGNOSIS — R32 Unspecified urinary incontinence: Secondary | ICD-10-CM | POA: Diagnosis not present

## 2012-11-03 DIAGNOSIS — R1310 Dysphagia, unspecified: Secondary | ICD-10-CM | POA: Diagnosis not present

## 2012-11-03 DIAGNOSIS — I10 Essential (primary) hypertension: Secondary | ICD-10-CM | POA: Diagnosis not present

## 2012-11-03 DIAGNOSIS — K219 Gastro-esophageal reflux disease without esophagitis: Secondary | ICD-10-CM | POA: Diagnosis not present

## 2012-11-04 DIAGNOSIS — K219 Gastro-esophageal reflux disease without esophagitis: Secondary | ICD-10-CM | POA: Diagnosis not present

## 2012-11-04 DIAGNOSIS — R1310 Dysphagia, unspecified: Secondary | ICD-10-CM | POA: Diagnosis not present

## 2012-11-04 DIAGNOSIS — R269 Unspecified abnormalities of gait and mobility: Secondary | ICD-10-CM | POA: Diagnosis not present

## 2012-11-04 DIAGNOSIS — I10 Essential (primary) hypertension: Secondary | ICD-10-CM | POA: Diagnosis not present

## 2012-11-04 DIAGNOSIS — R32 Unspecified urinary incontinence: Secondary | ICD-10-CM | POA: Diagnosis not present

## 2012-11-04 DIAGNOSIS — M1990 Unspecified osteoarthritis, unspecified site: Secondary | ICD-10-CM | POA: Diagnosis not present

## 2012-11-06 DIAGNOSIS — R1310 Dysphagia, unspecified: Secondary | ICD-10-CM | POA: Diagnosis not present

## 2012-11-06 DIAGNOSIS — I10 Essential (primary) hypertension: Secondary | ICD-10-CM | POA: Diagnosis not present

## 2012-11-06 DIAGNOSIS — K219 Gastro-esophageal reflux disease without esophagitis: Secondary | ICD-10-CM | POA: Diagnosis not present

## 2012-11-06 DIAGNOSIS — R269 Unspecified abnormalities of gait and mobility: Secondary | ICD-10-CM | POA: Diagnosis not present

## 2012-11-06 DIAGNOSIS — R32 Unspecified urinary incontinence: Secondary | ICD-10-CM | POA: Diagnosis not present

## 2012-11-06 DIAGNOSIS — M1990 Unspecified osteoarthritis, unspecified site: Secondary | ICD-10-CM | POA: Diagnosis not present

## 2012-11-11 DIAGNOSIS — R32 Unspecified urinary incontinence: Secondary | ICD-10-CM | POA: Diagnosis not present

## 2012-11-11 DIAGNOSIS — K219 Gastro-esophageal reflux disease without esophagitis: Secondary | ICD-10-CM | POA: Diagnosis not present

## 2012-11-11 DIAGNOSIS — R1310 Dysphagia, unspecified: Secondary | ICD-10-CM | POA: Diagnosis not present

## 2012-11-11 DIAGNOSIS — M1990 Unspecified osteoarthritis, unspecified site: Secondary | ICD-10-CM | POA: Diagnosis not present

## 2012-11-11 DIAGNOSIS — R269 Unspecified abnormalities of gait and mobility: Secondary | ICD-10-CM | POA: Diagnosis not present

## 2012-11-11 DIAGNOSIS — I10 Essential (primary) hypertension: Secondary | ICD-10-CM | POA: Diagnosis not present

## 2012-11-12 DIAGNOSIS — R1310 Dysphagia, unspecified: Secondary | ICD-10-CM | POA: Diagnosis not present

## 2012-11-12 DIAGNOSIS — M1990 Unspecified osteoarthritis, unspecified site: Secondary | ICD-10-CM | POA: Diagnosis not present

## 2012-11-12 DIAGNOSIS — K219 Gastro-esophageal reflux disease without esophagitis: Secondary | ICD-10-CM | POA: Diagnosis not present

## 2012-11-12 DIAGNOSIS — I10 Essential (primary) hypertension: Secondary | ICD-10-CM | POA: Diagnosis not present

## 2012-11-12 DIAGNOSIS — R269 Unspecified abnormalities of gait and mobility: Secondary | ICD-10-CM | POA: Diagnosis not present

## 2012-11-12 DIAGNOSIS — R32 Unspecified urinary incontinence: Secondary | ICD-10-CM | POA: Diagnosis not present

## 2012-11-16 DIAGNOSIS — M1990 Unspecified osteoarthritis, unspecified site: Secondary | ICD-10-CM | POA: Diagnosis not present

## 2012-11-16 DIAGNOSIS — R32 Unspecified urinary incontinence: Secondary | ICD-10-CM | POA: Diagnosis not present

## 2012-11-16 DIAGNOSIS — I10 Essential (primary) hypertension: Secondary | ICD-10-CM | POA: Diagnosis not present

## 2012-11-16 DIAGNOSIS — R1310 Dysphagia, unspecified: Secondary | ICD-10-CM | POA: Diagnosis not present

## 2012-11-16 DIAGNOSIS — K219 Gastro-esophageal reflux disease without esophagitis: Secondary | ICD-10-CM | POA: Diagnosis not present

## 2012-11-16 DIAGNOSIS — R269 Unspecified abnormalities of gait and mobility: Secondary | ICD-10-CM | POA: Diagnosis not present

## 2012-11-18 DIAGNOSIS — K219 Gastro-esophageal reflux disease without esophagitis: Secondary | ICD-10-CM | POA: Diagnosis not present

## 2012-11-18 DIAGNOSIS — R32 Unspecified urinary incontinence: Secondary | ICD-10-CM | POA: Diagnosis not present

## 2012-11-18 DIAGNOSIS — R1310 Dysphagia, unspecified: Secondary | ICD-10-CM | POA: Diagnosis not present

## 2012-11-18 DIAGNOSIS — M1990 Unspecified osteoarthritis, unspecified site: Secondary | ICD-10-CM | POA: Diagnosis not present

## 2012-11-18 DIAGNOSIS — I10 Essential (primary) hypertension: Secondary | ICD-10-CM | POA: Diagnosis not present

## 2012-11-18 DIAGNOSIS — R269 Unspecified abnormalities of gait and mobility: Secondary | ICD-10-CM | POA: Diagnosis not present

## 2012-11-19 DIAGNOSIS — I10 Essential (primary) hypertension: Secondary | ICD-10-CM | POA: Diagnosis not present

## 2012-11-19 DIAGNOSIS — R269 Unspecified abnormalities of gait and mobility: Secondary | ICD-10-CM | POA: Diagnosis not present

## 2012-11-19 DIAGNOSIS — M1990 Unspecified osteoarthritis, unspecified site: Secondary | ICD-10-CM | POA: Diagnosis not present

## 2012-11-19 DIAGNOSIS — R32 Unspecified urinary incontinence: Secondary | ICD-10-CM | POA: Diagnosis not present

## 2012-11-19 DIAGNOSIS — R1310 Dysphagia, unspecified: Secondary | ICD-10-CM | POA: Diagnosis not present

## 2012-11-19 DIAGNOSIS — K219 Gastro-esophageal reflux disease without esophagitis: Secondary | ICD-10-CM | POA: Diagnosis not present

## 2012-11-24 DIAGNOSIS — R1310 Dysphagia, unspecified: Secondary | ICD-10-CM | POA: Diagnosis not present

## 2012-11-24 DIAGNOSIS — R269 Unspecified abnormalities of gait and mobility: Secondary | ICD-10-CM | POA: Diagnosis not present

## 2012-11-24 DIAGNOSIS — I10 Essential (primary) hypertension: Secondary | ICD-10-CM | POA: Diagnosis not present

## 2012-11-24 DIAGNOSIS — R32 Unspecified urinary incontinence: Secondary | ICD-10-CM | POA: Diagnosis not present

## 2012-11-24 DIAGNOSIS — M1990 Unspecified osteoarthritis, unspecified site: Secondary | ICD-10-CM | POA: Diagnosis not present

## 2012-11-24 DIAGNOSIS — K219 Gastro-esophageal reflux disease without esophagitis: Secondary | ICD-10-CM | POA: Diagnosis not present

## 2012-11-25 DIAGNOSIS — R1310 Dysphagia, unspecified: Secondary | ICD-10-CM | POA: Diagnosis not present

## 2012-11-25 DIAGNOSIS — M1990 Unspecified osteoarthritis, unspecified site: Secondary | ICD-10-CM | POA: Diagnosis not present

## 2012-11-25 DIAGNOSIS — I10 Essential (primary) hypertension: Secondary | ICD-10-CM | POA: Diagnosis not present

## 2012-11-25 DIAGNOSIS — R32 Unspecified urinary incontinence: Secondary | ICD-10-CM | POA: Diagnosis not present

## 2012-11-25 DIAGNOSIS — R269 Unspecified abnormalities of gait and mobility: Secondary | ICD-10-CM | POA: Diagnosis not present

## 2012-11-25 DIAGNOSIS — K219 Gastro-esophageal reflux disease without esophagitis: Secondary | ICD-10-CM | POA: Diagnosis not present

## 2012-12-15 DIAGNOSIS — G609 Hereditary and idiopathic neuropathy, unspecified: Secondary | ICD-10-CM | POA: Diagnosis not present

## 2012-12-15 DIAGNOSIS — K219 Gastro-esophageal reflux disease without esophagitis: Secondary | ICD-10-CM | POA: Diagnosis not present

## 2012-12-15 DIAGNOSIS — F0390 Unspecified dementia without behavioral disturbance: Secondary | ICD-10-CM | POA: Diagnosis not present

## 2012-12-15 DIAGNOSIS — G2589 Other specified extrapyramidal and movement disorders: Secondary | ICD-10-CM | POA: Diagnosis not present

## 2012-12-15 DIAGNOSIS — Z0000 Encounter for general adult medical examination without abnormal findings: Secondary | ICD-10-CM | POA: Diagnosis not present

## 2012-12-15 DIAGNOSIS — Z1331 Encounter for screening for depression: Secondary | ICD-10-CM | POA: Diagnosis not present

## 2012-12-15 DIAGNOSIS — I10 Essential (primary) hypertension: Secondary | ICD-10-CM | POA: Diagnosis not present

## 2012-12-16 DIAGNOSIS — H903 Sensorineural hearing loss, bilateral: Secondary | ICD-10-CM | POA: Diagnosis not present

## 2012-12-21 DIAGNOSIS — H4900 Third [oculomotor] nerve palsy, unspecified eye: Secondary | ICD-10-CM | POA: Diagnosis not present

## 2012-12-21 DIAGNOSIS — Z961 Presence of intraocular lens: Secondary | ICD-10-CM | POA: Diagnosis not present

## 2012-12-24 DIAGNOSIS — R1310 Dysphagia, unspecified: Secondary | ICD-10-CM | POA: Diagnosis not present

## 2013-01-05 DIAGNOSIS — L89309 Pressure ulcer of unspecified buttock, unspecified stage: Secondary | ICD-10-CM | POA: Diagnosis not present

## 2013-01-05 DIAGNOSIS — Z85828 Personal history of other malignant neoplasm of skin: Secondary | ICD-10-CM | POA: Diagnosis not present

## 2013-01-06 DIAGNOSIS — H18519 Endothelial corneal dystrophy, unspecified eye: Secondary | ICD-10-CM | POA: Diagnosis not present

## 2013-01-06 DIAGNOSIS — H4900 Third [oculomotor] nerve palsy, unspecified eye: Secondary | ICD-10-CM | POA: Diagnosis not present

## 2013-01-06 DIAGNOSIS — D131 Benign neoplasm of stomach: Secondary | ICD-10-CM | POA: Diagnosis not present

## 2013-01-06 DIAGNOSIS — K222 Esophageal obstruction: Secondary | ICD-10-CM | POA: Diagnosis not present

## 2013-01-06 DIAGNOSIS — R1310 Dysphagia, unspecified: Secondary | ICD-10-CM | POA: Diagnosis not present

## 2013-01-07 DIAGNOSIS — R1310 Dysphagia, unspecified: Secondary | ICD-10-CM | POA: Diagnosis not present

## 2013-01-07 DIAGNOSIS — K222 Esophageal obstruction: Secondary | ICD-10-CM | POA: Diagnosis not present

## 2013-01-07 DIAGNOSIS — Z961 Presence of intraocular lens: Secondary | ICD-10-CM | POA: Diagnosis not present

## 2013-01-07 DIAGNOSIS — H18519 Endothelial corneal dystrophy, unspecified eye: Secondary | ICD-10-CM | POA: Diagnosis not present

## 2013-01-07 DIAGNOSIS — H47099 Other disorders of optic nerve, not elsewhere classified, unspecified eye: Secondary | ICD-10-CM | POA: Diagnosis not present

## 2013-01-08 DIAGNOSIS — M1710 Unilateral primary osteoarthritis, unspecified knee: Secondary | ICD-10-CM | POA: Diagnosis not present

## 2013-01-12 ENCOUNTER — Telehealth: Payer: Self-pay

## 2013-01-18 DIAGNOSIS — M1710 Unilateral primary osteoarthritis, unspecified knee: Secondary | ICD-10-CM | POA: Diagnosis not present

## 2013-01-22 DIAGNOSIS — H4900 Third [oculomotor] nerve palsy, unspecified eye: Secondary | ICD-10-CM | POA: Diagnosis not present

## 2013-01-22 DIAGNOSIS — Z961 Presence of intraocular lens: Secondary | ICD-10-CM | POA: Diagnosis not present

## 2013-01-22 DIAGNOSIS — H04129 Dry eye syndrome of unspecified lacrimal gland: Secondary | ICD-10-CM | POA: Diagnosis not present

## 2013-01-25 DIAGNOSIS — M1710 Unilateral primary osteoarthritis, unspecified knee: Secondary | ICD-10-CM | POA: Diagnosis not present

## 2013-01-26 DIAGNOSIS — Z881 Allergy status to other antibiotic agents status: Secondary | ICD-10-CM | POA: Diagnosis not present

## 2013-01-26 DIAGNOSIS — I10 Essential (primary) hypertension: Secondary | ICD-10-CM | POA: Diagnosis not present

## 2013-01-26 DIAGNOSIS — R1310 Dysphagia, unspecified: Secondary | ICD-10-CM | POA: Diagnosis not present

## 2013-01-26 DIAGNOSIS — H18519 Endothelial corneal dystrophy, unspecified eye: Secondary | ICD-10-CM | POA: Diagnosis not present

## 2013-01-26 DIAGNOSIS — Z87891 Personal history of nicotine dependence: Secondary | ICD-10-CM | POA: Diagnosis not present

## 2013-01-26 DIAGNOSIS — K222 Esophageal obstruction: Secondary | ICD-10-CM | POA: Diagnosis not present

## 2013-01-26 DIAGNOSIS — K200 Eosinophilic esophagitis: Secondary | ICD-10-CM | POA: Diagnosis not present

## 2013-02-03 NOTE — Telephone Encounter (Signed)
error 

## 2013-02-05 DIAGNOSIS — R1310 Dysphagia, unspecified: Secondary | ICD-10-CM | POA: Diagnosis not present

## 2013-02-09 ENCOUNTER — Other Ambulatory Visit: Payer: Self-pay

## 2013-02-09 DIAGNOSIS — R1310 Dysphagia, unspecified: Secondary | ICD-10-CM | POA: Diagnosis not present

## 2013-02-09 DIAGNOSIS — K222 Esophageal obstruction: Secondary | ICD-10-CM | POA: Diagnosis not present

## 2013-02-09 MED ORDER — CLONAZEPAM 0.5 MG PO TABS: 0.5000 mg | ORAL_TABLET | Freq: Every evening | ORAL | Status: DC | PRN

## 2013-02-09 MED ORDER — PRAMIPEXOLE DIHYDROCHLORIDE 0.5 MG PO TABS: 0.5000 mg | ORAL_TABLET | Freq: Three times a day (TID) | ORAL | Status: DC

## 2013-02-09 NOTE — Telephone Encounter (Signed)
Former [facility] Patient calling to request refills be sent to [facility].  She has been assigned to Dr Terrace Arabia.

## 2013-02-23 DIAGNOSIS — T7800XA Anaphylactic reaction due to unspecified food, initial encounter: Secondary | ICD-10-CM | POA: Diagnosis not present

## 2013-03-31 DIAGNOSIS — H4900 Third [oculomotor] nerve palsy, unspecified eye: Secondary | ICD-10-CM | POA: Diagnosis not present

## 2013-03-31 DIAGNOSIS — H00019 Hordeolum externum unspecified eye, unspecified eyelid: Secondary | ICD-10-CM | POA: Diagnosis not present

## 2013-03-31 DIAGNOSIS — Z961 Presence of intraocular lens: Secondary | ICD-10-CM | POA: Diagnosis not present

## 2013-03-31 DIAGNOSIS — H04129 Dry eye syndrome of unspecified lacrimal gland: Secondary | ICD-10-CM | POA: Diagnosis not present

## 2013-04-01 DIAGNOSIS — K200 Eosinophilic esophagitis: Secondary | ICD-10-CM | POA: Diagnosis not present

## 2013-04-01 DIAGNOSIS — R634 Abnormal weight loss: Secondary | ICD-10-CM | POA: Diagnosis not present

## 2013-04-13 DIAGNOSIS — F0390 Unspecified dementia without behavioral disturbance: Secondary | ICD-10-CM | POA: Diagnosis not present

## 2013-04-13 DIAGNOSIS — G2589 Other specified extrapyramidal and movement disorders: Secondary | ICD-10-CM | POA: Diagnosis not present

## 2013-04-13 DIAGNOSIS — G609 Hereditary and idiopathic neuropathy, unspecified: Secondary | ICD-10-CM | POA: Diagnosis not present

## 2013-04-13 DIAGNOSIS — I10 Essential (primary) hypertension: Secondary | ICD-10-CM | POA: Diagnosis not present

## 2013-04-19 ENCOUNTER — Telehealth: Payer: Self-pay

## 2013-04-19 NOTE — Telephone Encounter (Signed)
Each pharmacy dispenses different manufacturers.  Unfortunately, because of this, we do not know what manufacturer they use.  I called the patient back.  I recommended she contact the pharmacy so they could ensure she has the correct medication.  Explained they should go over the info with her.  She will follow up with them and call us back if needed.

## 2013-04-19 NOTE — Telephone Encounter (Signed)
Message copied by Malachy Moan on Mon Apr 19, 2013  5:34 PM ------      Message from: Salome Spotted      Created: Mon Apr 19, 2013  4:46 PM      Contact: pt       Please help this lady       ----- Message -----         From: Doreene Eland         Sent: 04/19/2013  11:32 AM           To: [facility]            Pt is calling and has questions regarding her medication she got by mail order.  She states it is a different color and shape.  The dosage is the same but she is concerned because it looks different.  Please call and advise.       ------

## 2013-04-23 ENCOUNTER — Encounter: Payer: Self-pay | Admitting: Neurology

## 2013-04-23 ENCOUNTER — Ambulatory Visit (INDEPENDENT_AMBULATORY_CARE_PROVIDER_SITE_OTHER): Payer: Medicare Other | Admitting: Neurology

## 2013-04-23 VITALS — BP 122/58 | HR 66 | Ht 61.0 in | Wt 161.0 lb

## 2013-04-23 DIAGNOSIS — G5600 Carpal tunnel syndrome, unspecified upper limb: Secondary | ICD-10-CM | POA: Diagnosis not present

## 2013-04-23 DIAGNOSIS — R269 Unspecified abnormalities of gait and mobility: Secondary | ICD-10-CM | POA: Diagnosis not present

## 2013-04-23 DIAGNOSIS — R634 Abnormal weight loss: Secondary | ICD-10-CM

## 2013-04-23 DIAGNOSIS — G3184 Mild cognitive impairment, so stated: Secondary | ICD-10-CM

## 2013-04-23 DIAGNOSIS — M79609 Pain in unspecified limb: Secondary | ICD-10-CM | POA: Insufficient documentation

## 2013-04-23 DIAGNOSIS — R519 Headache, unspecified: Secondary | ICD-10-CM

## 2013-04-23 DIAGNOSIS — R1310 Dysphagia, unspecified: Secondary | ICD-10-CM | POA: Diagnosis not present

## 2013-04-23 DIAGNOSIS — K589 Irritable bowel syndrome without diarrhea: Secondary | ICD-10-CM

## 2013-04-23 DIAGNOSIS — R1110 Vomiting, unspecified: Secondary | ICD-10-CM

## 2013-04-23 NOTE — Patient Instructions (Signed)
She may try to taper down mirapex 0.5mg  to one tab twice a day xone week, then one every night.  Mirapex may cause drowsiness. If her restless leg symptoms come back, she can increase Mirapex dose to 0.5mg  three times a day again

## 2013-04-23 NOTE — Progress Notes (Signed)
History of Present Illness:    79 year old right-handed white widowed female accompanied by her daughter for follow up of memory loss, restless leg syndrome carpal tunnel syndrome.  She was a patient of Dr. Sandria Manly, she has PMhx of gait disorder, peripheral neuropathy, restless leg syndrome, mild cognitive impairment, still lives alone in a condominium, no longer driving.  EMG/NCV was normal in 05/26/00. Workup  including  hemoglobin A1c,Sedimentation rate, T4, B12, TSH, fasting glucose, 2 hour pc glucose, and serum protein electrophoresis have been normal. .   She has pain in her left shoulder followed by Dr. Sedonia Small for rotator cuff disease. She had a shoulder MRI 02/13/2010.She also has discomfort in her legs and is being followed by him for the possibility of lumbar spinal stenosis and had a lumbar MRI.  She complains of left hip pain and bilateral shoulder pain. She has tried Lyrica without benefit.  She has a history of bilateral carpal tunnel syndrome and underwent right carpal tunnel release in 07/26/2008.   She has 80% loss of hearing in her right ear and 70% loss of hearing in her left ear followed by Dr. Suszanne Conners.   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  She was seen by Dr. Sandria Manly since 11/03/1998 for restless leg syndrome and tried her on Requip initially and then began Mirapex 02/13/2000. Her last ferritin level was 69 01/13/2009. She has held off on surgery for bilateral knee pain because of her age. She has urinary incontinence. She complains of daytime sleepiness, now taking mirapex 0.5mg  tid.  She had a repeat study in 03/09/2010 showing severe insomnia, sleep fragmentation with very frequent RLS symptoms and  PLM  with rhythmic movements in non-REM sleep causing her arousals. It was felt that her PLMs were not sufficiently treated.  She is on Mirapex 0.5mg  tid  and  clonazepam 0.5 mg once at night. She falls asleep as many as 5  times during the day. She awakens 5 times at night related to restless leg syndrome and pain in her legs.    She walks with a walker. She has not fallen. She has bowel and bladder incontinence.  She has an esophageal stricture with dysphagia and vomiting without nausea. She is seen by physician at [facility] for esophageal constriction.  She is independent in activities of living but does not cook. At times her daughter's help her get dressed.   She has "bee stings in my legs"extending from the ankles to the knees". These are sporadic.They can occur sitting or standing. They are 3/10 in pain. There is nothing which makes them better or worse. Her knees also hurt when she stands.  She is on Aricept 10 mg q.a.m. by Dr.Husain  because of memory loss and is concerned because of a  family history of memory loss.   Review of Systems  Out of a complete 14 system review, the patient complains of only the following symptoms, and all other reviewed systems are negative.  Hearing loss, ringing ears, trouble swallowing, itching, loss of vision, eye pain, incontinence, diarrhea, constipation, easy bruising, joints pain, joint swelling, cramps, confusion, numbness, weakness, difficulty swallowing, tremor, insomnia, sleepiness, restless leg, anxiety, not enough sleep, decreased energy     Physical Exam  General:  Well-developed obese white female.  Neurologic Exam  Mental Status:  Alert and oriented, Mini-mental status 29/30, she missed one on serial 7s. Cranial Nerves:  Left ptosis. Left exotropia. Decreased vision OS, able to  read large print. Visual fields full OD. Decreased response to light left pupil. left third nerve palsy.  Hearing decreased bilaterally. Mild hard of hearing. Tongue midline, uvula midline and gags present Motor: . 4+/5. Bilateral iliopsoas weakness, slight bilateral ankle dorsiflexion weakness 4+/5 Sensory:  Decreased pinprick, touch, vibration,  and  joint position in the lower  extremities. Coordination:  Normal finger to nose, limitation due to right shoulder pain, normal bilateral heel to shin Gait and Station:  Wide-based gait.   Cautious, unsteady, depend on walker, bilateral valgus. Reflexes: 1/4 reflexes in the upper extremities. Absent bilateral lower extremity reflexes.  Plantar responses were downgoing  Assessment and plan: 79 years old right-handed Caucasian female, with past medical history of peripheral neuropathy, restless leg syndrome, dysphagia due to esophageal constriction, mild cognitive impairment overall she is doing very well  1 continue moderate exercise at home 2. Mirapex 0.5 mg 3 times a day, which has been very helpful for restless leg symptoms, but she complains of excessive daytime sleepiness, I have suggested her to taper it off to one tablet every night, if she has recurrent restless leg symptoms at daytime, she may go back on 0.5mg  3 times a day, 3. Continue Aricept 10 mg every day

## 2013-05-11 DIAGNOSIS — M1710 Unilateral primary osteoarthritis, unspecified knee: Secondary | ICD-10-CM | POA: Diagnosis not present

## 2013-05-11 DIAGNOSIS — IMO0002 Reserved for concepts with insufficient information to code with codable children: Secondary | ICD-10-CM | POA: Diagnosis not present

## 2013-05-12 DIAGNOSIS — R634 Abnormal weight loss: Secondary | ICD-10-CM | POA: Diagnosis not present

## 2013-06-09 DIAGNOSIS — R634 Abnormal weight loss: Secondary | ICD-10-CM | POA: Diagnosis not present

## 2013-06-09 DIAGNOSIS — Z9849 Cataract extraction status, unspecified eye: Secondary | ICD-10-CM | POA: Diagnosis not present

## 2013-06-09 DIAGNOSIS — R1310 Dysphagia, unspecified: Secondary | ICD-10-CM | POA: Diagnosis not present

## 2013-06-09 DIAGNOSIS — Z90710 Acquired absence of both cervix and uterus: Secondary | ICD-10-CM | POA: Diagnosis not present

## 2013-06-09 DIAGNOSIS — B3781 Candidal esophagitis: Secondary | ICD-10-CM | POA: Diagnosis not present

## 2013-06-15 DIAGNOSIS — L89309 Pressure ulcer of unspecified buttock, unspecified stage: Secondary | ICD-10-CM | POA: Diagnosis not present

## 2013-06-17 DIAGNOSIS — Z4800 Encounter for change or removal of nonsurgical wound dressing: Secondary | ICD-10-CM | POA: Diagnosis not present

## 2013-06-17 DIAGNOSIS — I10 Essential (primary) hypertension: Secondary | ICD-10-CM | POA: Diagnosis not present

## 2013-06-17 DIAGNOSIS — R32 Unspecified urinary incontinence: Secondary | ICD-10-CM | POA: Diagnosis not present

## 2013-06-17 DIAGNOSIS — M1990 Unspecified osteoarthritis, unspecified site: Secondary | ICD-10-CM | POA: Diagnosis not present

## 2013-06-17 DIAGNOSIS — G609 Hereditary and idiopathic neuropathy, unspecified: Secondary | ICD-10-CM | POA: Diagnosis not present

## 2013-06-17 DIAGNOSIS — L8995 Pressure ulcer of unspecified site, unstageable: Secondary | ICD-10-CM | POA: Diagnosis not present

## 2013-06-17 DIAGNOSIS — L89109 Pressure ulcer of unspecified part of back, unspecified stage: Secondary | ICD-10-CM | POA: Diagnosis not present

## 2013-06-21 DIAGNOSIS — L89109 Pressure ulcer of unspecified part of back, unspecified stage: Secondary | ICD-10-CM | POA: Diagnosis not present

## 2013-06-21 DIAGNOSIS — G609 Hereditary and idiopathic neuropathy, unspecified: Secondary | ICD-10-CM | POA: Diagnosis not present

## 2013-06-21 DIAGNOSIS — M1990 Unspecified osteoarthritis, unspecified site: Secondary | ICD-10-CM | POA: Diagnosis not present

## 2013-06-21 DIAGNOSIS — L8995 Pressure ulcer of unspecified site, unstageable: Secondary | ICD-10-CM | POA: Diagnosis not present

## 2013-06-21 DIAGNOSIS — I10 Essential (primary) hypertension: Secondary | ICD-10-CM | POA: Diagnosis not present

## 2013-06-23 ENCOUNTER — Telehealth: Payer: Self-pay | Admitting: Neurology

## 2013-06-23 DIAGNOSIS — G609 Hereditary and idiopathic neuropathy, unspecified: Secondary | ICD-10-CM | POA: Diagnosis not present

## 2013-06-23 DIAGNOSIS — L89109 Pressure ulcer of unspecified part of back, unspecified stage: Secondary | ICD-10-CM | POA: Diagnosis not present

## 2013-06-23 DIAGNOSIS — I10 Essential (primary) hypertension: Secondary | ICD-10-CM | POA: Diagnosis not present

## 2013-06-23 DIAGNOSIS — L8995 Pressure ulcer of unspecified site, unstageable: Secondary | ICD-10-CM | POA: Diagnosis not present

## 2013-06-23 DIAGNOSIS — M1990 Unspecified osteoarthritis, unspecified site: Secondary | ICD-10-CM | POA: Diagnosis not present

## 2013-06-24 DIAGNOSIS — L89109 Pressure ulcer of unspecified part of back, unspecified stage: Secondary | ICD-10-CM | POA: Diagnosis not present

## 2013-06-24 DIAGNOSIS — L8995 Pressure ulcer of unspecified site, unstageable: Secondary | ICD-10-CM | POA: Diagnosis not present

## 2013-06-24 DIAGNOSIS — G609 Hereditary and idiopathic neuropathy, unspecified: Secondary | ICD-10-CM | POA: Diagnosis not present

## 2013-06-24 DIAGNOSIS — I10 Essential (primary) hypertension: Secondary | ICD-10-CM | POA: Diagnosis not present

## 2013-06-24 DIAGNOSIS — M1990 Unspecified osteoarthritis, unspecified site: Secondary | ICD-10-CM | POA: Diagnosis not present

## 2013-06-25 DIAGNOSIS — L8995 Pressure ulcer of unspecified site, unstageable: Secondary | ICD-10-CM | POA: Diagnosis not present

## 2013-06-25 DIAGNOSIS — L89109 Pressure ulcer of unspecified part of back, unspecified stage: Secondary | ICD-10-CM | POA: Diagnosis not present

## 2013-06-25 DIAGNOSIS — G609 Hereditary and idiopathic neuropathy, unspecified: Secondary | ICD-10-CM | POA: Diagnosis not present

## 2013-06-25 DIAGNOSIS — M1990 Unspecified osteoarthritis, unspecified site: Secondary | ICD-10-CM | POA: Diagnosis not present

## 2013-06-25 DIAGNOSIS — I10 Essential (primary) hypertension: Secondary | ICD-10-CM | POA: Diagnosis not present

## 2013-06-29 DIAGNOSIS — L89109 Pressure ulcer of unspecified part of back, unspecified stage: Secondary | ICD-10-CM | POA: Diagnosis not present

## 2013-06-29 DIAGNOSIS — M1990 Unspecified osteoarthritis, unspecified site: Secondary | ICD-10-CM | POA: Diagnosis not present

## 2013-06-29 DIAGNOSIS — G609 Hereditary and idiopathic neuropathy, unspecified: Secondary | ICD-10-CM | POA: Diagnosis not present

## 2013-06-29 DIAGNOSIS — I10 Essential (primary) hypertension: Secondary | ICD-10-CM | POA: Diagnosis not present

## 2013-06-29 DIAGNOSIS — L8995 Pressure ulcer of unspecified site, unstageable: Secondary | ICD-10-CM | POA: Diagnosis not present

## 2013-06-29 NOTE — Telephone Encounter (Signed)
I called pt and she is taking one mirapex 0.5mg  tab and still has RLS sx, and the daytime sleepiness.  Do you want to try something else?

## 2013-07-01 DIAGNOSIS — G609 Hereditary and idiopathic neuropathy, unspecified: Secondary | ICD-10-CM | POA: Diagnosis not present

## 2013-07-01 DIAGNOSIS — L8995 Pressure ulcer of unspecified site, unstageable: Secondary | ICD-10-CM | POA: Diagnosis not present

## 2013-07-01 DIAGNOSIS — M1990 Unspecified osteoarthritis, unspecified site: Secondary | ICD-10-CM | POA: Diagnosis not present

## 2013-07-01 DIAGNOSIS — L89109 Pressure ulcer of unspecified part of back, unspecified stage: Secondary | ICD-10-CM | POA: Diagnosis not present

## 2013-07-01 DIAGNOSIS — I10 Essential (primary) hypertension: Secondary | ICD-10-CM | POA: Diagnosis not present

## 2013-07-06 DIAGNOSIS — L89109 Pressure ulcer of unspecified part of back, unspecified stage: Secondary | ICD-10-CM | POA: Diagnosis not present

## 2013-07-06 DIAGNOSIS — I10 Essential (primary) hypertension: Secondary | ICD-10-CM | POA: Diagnosis not present

## 2013-07-06 DIAGNOSIS — M1990 Unspecified osteoarthritis, unspecified site: Secondary | ICD-10-CM | POA: Diagnosis not present

## 2013-07-06 DIAGNOSIS — L8995 Pressure ulcer of unspecified site, unstageable: Secondary | ICD-10-CM | POA: Diagnosis not present

## 2013-07-06 DIAGNOSIS — G609 Hereditary and idiopathic neuropathy, unspecified: Secondary | ICD-10-CM | POA: Diagnosis not present

## 2013-07-07 ENCOUNTER — Telehealth: Payer: Self-pay | Admitting: Neurology

## 2013-07-07 MED ORDER — CARBIDOPA-LEVODOPA 25-100 MG PO TABS: 1.0000 | ORAL_TABLET | Freq: Every day | ORAL | Status: DC

## 2013-07-07 NOTE — Telephone Encounter (Signed)
I have called her, she has tried requip and mirapex without helping her restless leg syndrome. I have suggested her to stop mirapex and requip, start sinemet 25/100 qhs for her symptoms.

## 2013-07-08 DIAGNOSIS — I10 Essential (primary) hypertension: Secondary | ICD-10-CM | POA: Diagnosis not present

## 2013-07-08 DIAGNOSIS — M1990 Unspecified osteoarthritis, unspecified site: Secondary | ICD-10-CM | POA: Diagnosis not present

## 2013-07-08 DIAGNOSIS — G609 Hereditary and idiopathic neuropathy, unspecified: Secondary | ICD-10-CM | POA: Diagnosis not present

## 2013-07-08 DIAGNOSIS — L89109 Pressure ulcer of unspecified part of back, unspecified stage: Secondary | ICD-10-CM | POA: Diagnosis not present

## 2013-07-08 DIAGNOSIS — L8995 Pressure ulcer of unspecified site, unstageable: Secondary | ICD-10-CM | POA: Diagnosis not present

## 2013-07-09 DIAGNOSIS — H18519 Endothelial corneal dystrophy, unspecified eye: Secondary | ICD-10-CM | POA: Diagnosis not present

## 2013-07-09 DIAGNOSIS — Z881 Allergy status to other antibiotic agents status: Secondary | ICD-10-CM | POA: Diagnosis not present

## 2013-07-09 DIAGNOSIS — K219 Gastro-esophageal reflux disease without esophagitis: Secondary | ICD-10-CM | POA: Diagnosis not present

## 2013-07-09 DIAGNOSIS — I10 Essential (primary) hypertension: Secondary | ICD-10-CM | POA: Diagnosis not present

## 2013-07-09 DIAGNOSIS — H4900 Third [oculomotor] nerve palsy, unspecified eye: Secondary | ICD-10-CM | POA: Diagnosis not present

## 2013-07-09 DIAGNOSIS — G2581 Restless legs syndrome: Secondary | ICD-10-CM | POA: Diagnosis not present

## 2013-07-09 DIAGNOSIS — Z87891 Personal history of nicotine dependence: Secondary | ICD-10-CM | POA: Diagnosis not present

## 2013-07-09 DIAGNOSIS — Z961 Presence of intraocular lens: Secondary | ICD-10-CM | POA: Diagnosis not present

## 2013-07-09 DIAGNOSIS — K200 Eosinophilic esophagitis: Secondary | ICD-10-CM | POA: Diagnosis not present

## 2013-07-09 DIAGNOSIS — H04129 Dry eye syndrome of unspecified lacrimal gland: Secondary | ICD-10-CM | POA: Diagnosis not present

## 2013-07-13 DIAGNOSIS — I10 Essential (primary) hypertension: Secondary | ICD-10-CM | POA: Diagnosis not present

## 2013-07-13 DIAGNOSIS — M1990 Unspecified osteoarthritis, unspecified site: Secondary | ICD-10-CM | POA: Diagnosis not present

## 2013-07-13 DIAGNOSIS — L89109 Pressure ulcer of unspecified part of back, unspecified stage: Secondary | ICD-10-CM | POA: Diagnosis not present

## 2013-07-13 DIAGNOSIS — L8995 Pressure ulcer of unspecified site, unstageable: Secondary | ICD-10-CM | POA: Diagnosis not present

## 2013-07-13 DIAGNOSIS — G609 Hereditary and idiopathic neuropathy, unspecified: Secondary | ICD-10-CM | POA: Diagnosis not present

## 2013-07-14 DIAGNOSIS — L8995 Pressure ulcer of unspecified site, unstageable: Secondary | ICD-10-CM | POA: Diagnosis not present

## 2013-07-14 DIAGNOSIS — I10 Essential (primary) hypertension: Secondary | ICD-10-CM | POA: Diagnosis not present

## 2013-07-14 DIAGNOSIS — M1990 Unspecified osteoarthritis, unspecified site: Secondary | ICD-10-CM | POA: Diagnosis not present

## 2013-07-14 DIAGNOSIS — G609 Hereditary and idiopathic neuropathy, unspecified: Secondary | ICD-10-CM | POA: Diagnosis not present

## 2013-07-14 DIAGNOSIS — L89109 Pressure ulcer of unspecified part of back, unspecified stage: Secondary | ICD-10-CM | POA: Diagnosis not present

## 2013-07-15 DIAGNOSIS — L8995 Pressure ulcer of unspecified site, unstageable: Secondary | ICD-10-CM | POA: Diagnosis not present

## 2013-07-15 DIAGNOSIS — I10 Essential (primary) hypertension: Secondary | ICD-10-CM | POA: Diagnosis not present

## 2013-07-15 DIAGNOSIS — M1990 Unspecified osteoarthritis, unspecified site: Secondary | ICD-10-CM | POA: Diagnosis not present

## 2013-07-15 DIAGNOSIS — G609 Hereditary and idiopathic neuropathy, unspecified: Secondary | ICD-10-CM | POA: Diagnosis not present

## 2013-07-15 DIAGNOSIS — L89109 Pressure ulcer of unspecified part of back, unspecified stage: Secondary | ICD-10-CM | POA: Diagnosis not present

## 2013-07-16 DIAGNOSIS — I10 Essential (primary) hypertension: Secondary | ICD-10-CM | POA: Diagnosis not present

## 2013-07-16 DIAGNOSIS — L8995 Pressure ulcer of unspecified site, unstageable: Secondary | ICD-10-CM | POA: Diagnosis not present

## 2013-07-16 DIAGNOSIS — M1990 Unspecified osteoarthritis, unspecified site: Secondary | ICD-10-CM | POA: Diagnosis not present

## 2013-07-16 DIAGNOSIS — L89109 Pressure ulcer of unspecified part of back, unspecified stage: Secondary | ICD-10-CM | POA: Diagnosis not present

## 2013-07-16 DIAGNOSIS — G609 Hereditary and idiopathic neuropathy, unspecified: Secondary | ICD-10-CM | POA: Diagnosis not present

## 2013-07-19 DIAGNOSIS — G609 Hereditary and idiopathic neuropathy, unspecified: Secondary | ICD-10-CM | POA: Diagnosis not present

## 2013-07-19 DIAGNOSIS — M1990 Unspecified osteoarthritis, unspecified site: Secondary | ICD-10-CM | POA: Diagnosis not present

## 2013-07-19 DIAGNOSIS — L89109 Pressure ulcer of unspecified part of back, unspecified stage: Secondary | ICD-10-CM | POA: Diagnosis not present

## 2013-07-19 DIAGNOSIS — L8995 Pressure ulcer of unspecified site, unstageable: Secondary | ICD-10-CM | POA: Diagnosis not present

## 2013-07-19 DIAGNOSIS — I10 Essential (primary) hypertension: Secondary | ICD-10-CM | POA: Diagnosis not present

## 2013-07-20 ENCOUNTER — Ambulatory Visit (INDEPENDENT_AMBULATORY_CARE_PROVIDER_SITE_OTHER): Payer: Medicare Other | Admitting: Neurology

## 2013-07-20 ENCOUNTER — Encounter: Payer: Self-pay | Admitting: Neurology

## 2013-07-20 VITALS — BP 110/72 | HR 64 | Temp 97.6°F | Resp 16 | Wt 156.0 lb

## 2013-07-20 DIAGNOSIS — D239 Other benign neoplasm of skin, unspecified: Secondary | ICD-10-CM | POA: Diagnosis not present

## 2013-07-20 DIAGNOSIS — G609 Hereditary and idiopathic neuropathy, unspecified: Secondary | ICD-10-CM

## 2013-07-20 DIAGNOSIS — Z85828 Personal history of other malignant neoplasm of skin: Secondary | ICD-10-CM | POA: Diagnosis not present

## 2013-07-20 DIAGNOSIS — Z79899 Other long term (current) drug therapy: Secondary | ICD-10-CM

## 2013-07-20 DIAGNOSIS — G2581 Restless legs syndrome: Secondary | ICD-10-CM | POA: Diagnosis not present

## 2013-07-20 DIAGNOSIS — D1801 Hemangioma of skin and subcutaneous tissue: Secondary | ICD-10-CM | POA: Diagnosis not present

## 2013-07-20 DIAGNOSIS — L821 Other seborrheic keratosis: Secondary | ICD-10-CM | POA: Diagnosis not present

## 2013-07-20 DIAGNOSIS — L819 Disorder of pigmentation, unspecified: Secondary | ICD-10-CM | POA: Diagnosis not present

## 2013-07-20 DIAGNOSIS — L89309 Pressure ulcer of unspecified buttock, unspecified stage: Secondary | ICD-10-CM | POA: Diagnosis not present

## 2013-07-20 LAB — CERULOPLASMIN: Ceruloplasmin: 37 mg/dL (ref 20–60)

## 2013-07-20 LAB — HEMOGLOBIN A1C: Hgb A1c MFr Bld: 5.3 % (ref 4.6–6.5)

## 2013-07-20 NOTE — Patient Instructions (Addendum)
Follow up in one month.

## 2013-07-20 NOTE — Progress Notes (Signed)
[facility] Neu[facility] Clinic Note - Initial Visit   Date: 07/20/2013    Stacey Jefferson MRN: [id number] DOB: 1925/03/30   Dear Dr Donette Larry:  Thank you for your kind referral of Stacey Jefferson for consultation of restless leg syndrome. Although her history is well known to you, please allow [place] to reiterate it for the purpose of our medical record. The patient was accompanied to the clinic by daughter.   History of Present Illness: Stacey Jefferson is a 79 y.o. year-old right-handed Caucasian female with history of  peripheral neuropathy, RLS, CN III palsy, and esphageal stricture, and GERD presenting for evaluation of RLS.  She was previously followed by Dr. Sandria Manly who had been managing her RLS for the past 10 years and recently saw Dr. Terrace Arabia at [facility] for the same symptoms.  Per Dr. Zannie Cove clinic note, patient has had a normal EMG in 2001.  She has was on Requip and then started Mirapex in 2001.  Currently, she takes mirapex 0.5mg  TID, sinemet 25/100 qhs, and clonazepam 0.5 mg once at night without significant change in her symptoms. No recent ferritin level.  She complains of involuntary movements of the legs when at rest.  For instance, she is at church her legs will start jumping.  She also complains of numbness and tingling of her hands and feet.  Denies any alleviating or exacerbating factors.  She was on neurontin several years ago, but does not recall if it helped.  She walks with a rollator and has not had any falls.    She has been taking Aricept 10 mg for memory loss for several years.  She currently lives alone and has an aide that comes once per week.  Her daughter also helps to take care of her needs.   Past Medical History  Diagnosis Date  . Weight loss   . Vomiting   . Dysphagia, unspecified(787.20)   . Memory loss   . Hypotension, unspecified   . Irritable bowel syndrome   . Restless legs syndrome (RLS)   . Carpal tunnel syndrome   . Abnormality of gait   . Headache   .  Pain in limb   . Polyneuropathy in other diseases classified elsewhere     Past Surgical History  Procedure Laterality Date  . Bladder aspiration    . Carpal tunnel release Right   . Squamous cell carcinoma excision       Medications:  Current Outpatient Prescriptions on File Prior to Visit  Medication Sig Dispense Refill  . acetaminophen (TYLENOL 8 HOUR) 650 MG CR tablet Take 650 mg by mouth 2 (two) times daily.      . bisoprolol-hydrochlorothiazide (ZIAC) 5-6.25 MG per tablet       . carbidopa-levodopa (SINEMET) 25-100 MG per tablet Take 1 tablet by mouth at bedtime.  30 tablet  12  . clonazePAM (KLONOPIN) 0.5 MG tablet Take 1 tablet (0.5 mg total) by mouth at bedtime as needed.  90 tablet  1  . Cyanocobalamin (VITAMIN B 12 PO) Take by mouth as directed.      . diphenoxylate-atropine (LOMOTIL) 2.5-0.025 MG per tablet       . donepezil (ARICEPT) 10 MG tablet Take 5 mg by mouth daily.       . enalapril (VASOTEC) 10 MG tablet       . FLOVENT HFA 220 MCG/ACT inhaler       . omeprazole (PRILOSEC) 20 MG capsule       . PREMARIN  0.625 MG tablet       . Salicylic Acid (SALACYN) 6 % LOTN Apply topically as directed.      . pramipexole (MIRAPEX) 0.5 MG tablet Take 1 tablet (0.5 mg total) by mouth 3 (three) times daily.  270 tablet  2   No current facility-administered medications on file prior to visit.    Allergies:  Allergies  Allergen Reactions  . Sulfa Antibiotics     Family History: Family History  Problem Relation Age of Onset  . Dementia Mother   . Stroke Father     Social History: History   Social History  . Marital Status: Widowed    Spouse Name: N/A    Number of Children: 6  . Years of Education: 12   Occupational History  .      retired   Social History Main Topics  . Smoking status: Former Smoker    Types: Cigarettes  . Smokeless tobacco: Never Used     Comment: Quit 60 years ago.  . Alcohol Use: Yes     Comment: Rare glass of wine  . Drug Use: No   . Sexual Activity: Not on file   Other Topics Concern  . Not on file   Social History Narrative   Patient is retired widowed and has six children. High school education.  Right handed.   Caffeine- two cups daily.  Retired Futures trader.             Review of Systems:  CONSTITUTIONAL: No fevers, chills, night sweats, or weight loss.   EYES: No visual changes or eye pain ENT: No hearing changes.  No history of nose bleeds.   RESPIRATORY: No cough, wheezing and shortness of breath.   CARDIOVASCULAR: Negative for chest pain, and palpitations.   GI: Negative for abdominal discomfort, blood in stools or black stools.  No recent change in bowel habits.   GU:  +history of incontinence.   MUSCLOSKELETAL: No history of joint pain or swelling.  No myalgias.   SKIN: Negative for lesions, rash, and itching.   HEMATOLOGY/ONCOLOGY: Negative for prolonged bleeding, bruising easily, and swollen nodes.  ENDOCRINE: Negative for cold or heat intolerance, polydipsia or goiter.   PSYCH:  No depression or anxiety symptoms.   NEURO: As Above.   Vital Signs:  BP 110/72  Pulse 64  Temp(Src) 97.6 F (36.4 C)  Resp 16  Wt 156 lb (70.761 kg)  BMI 29.49 kg/m2    Neurological Exam: MENTAL STATUS including orientation to time, place, person, recent and remote memory, attention span and concentration, language, and fund of knowledge is normal.  Speech is not dysarthric.  CRANIAL NERVES: II:  No visual field defects. Unremarkable fundi.   III-IV-VI: Pupils equal round and reactive to light. CN III palsy on left side with limited medial gaze, downward gaze, and upgaze (old).  There is moderatel left eye ptosis.  Extraocular muscles of the right eye is intact. V:  Normal facial sensation.  Jaw jerk is absent.   VII:  Normal facial symmetry and movements. Snout, Myerson's sign, and palmomental reflex present bilaterally.   VIII:  Normal hearing and vestibular function.   IX-X:  Normal palatal movement.    XI:  Normal shoulder shrug and head rotation.   XII:  Normal tongue strength and range of motion, no deviation or fasciculation.  MOTOR:  Mild generalized atrophy.  No fasciculations or abnormal movements.  No pronator drift.  Tone is normal.    Right Upper Extremity:  Left Upper Extremity:    Deltoid  4+   Deltoid  4+/5   Biceps  4+/5   Biceps  4+/5   Triceps  4+/5   Triceps  4+/5   Wrist extensors  5/5   Wrist extensors  5/5   Wrist flexors  5/5   Wrist flexors  5/5   Finger extensors  5/5   Finger extensors  5/5   Finger flexors  5/5   Finger flexors  5/5   Dorsal interossei  5/5   Dorsal interossei  5/5   Abductor pollicis  5/5   Abductor pollicis  5/5   Tone (Ashworth scale)  0  Tone (Ashworth scale)  0   Right Lower Extremity:    Left Lower Extremity:    Hip flexors  4+/5   Hip flexors  4+/5   Hip extensors  4+/5   Hip extensors  4+/5   Knee flexors  4+/5   Knee flexors  4+/5   Knee extensors  4+/5   Knee extensors  4+/5  Dorsiflexors  4+/5   Dorsiflexors  4+/5   Plantarflexors  4+/5   Plantarflexors  4+/5   Toe extensors  4+/5  Toe extensors  4+/5   Toe flexors  4+/5  Toe flexors  4+/5  Tone (Ashworth scale)  0  Tone (Ashworth scale)  0   MSRs:  Right                                                                 Left brachioradialis 1+  brachioradialis 1+  biceps 1+  biceps 1+  triceps 1+  triceps 1+  patellar 0  patellar 0  ankle jerk 0  ankle jerk 0  Hoffman no  Hoffman no  plantar response mute  plantar response mute   SENSORY:  Reduced vibration at the knees with reduced pin prick distal to mid-thighs and hands bilaterally.  Impaired proprioception.    COORDINATION/GAIT:  Mild dysmetria with finger-to- nose-finger.  Movements are slowed throughout. Unable to rise from a chair without using arms.  Gait is wide-based, but stable and tested with walker for stability.     IMPRESSION: Ms. Walsworth is a 79 year-old female with history of peripheral neuropathy,  RLS, and GERD who presents for evaluation of neuropathy and RLS.  Her neurological exam shows length-dependent pattern of neuropathy.  Her prior EMG was normal.  I would like to check for treatable causes of neuropathy and get a ferritin level since her RLS is so bothersome.  I will keep her medications as they are for now, until the labs result.    PLAN/RECOMMENDATIONS:  1.  Check the following labs  . Copper, serum  . Ceruloplasmin  . T3, free  . T4, free  . Methylmalonic acid, serum  . Vitamin B12  . Immunofixation electrophoresis  . Protein electrophoresis, serum  . Hemoglobin A1c  . Ferritin  . TSH  2.  Return to clinic in 5-month    The duration of this appointment visit was 45 minutes of face-to-face time with the patient.  Greater than 50% of this time was spent in counseling, explanation of diagnosis, planning of further management, and coordination of care.   Thank you for allowing me to participate in patient's care.  If I can answer any additional questions, I would be pleased to do so.    Sincerely,    Bernardino Dowell K. Posey Pronto, DO

## 2013-07-22 LAB — PROTEIN ELECTROPHORESIS, SERUM
Alpha-1-Globulin: 5 % — ABNORMAL HIGH (ref 2.9–4.9)
Alpha-2-Globulin: 11 % (ref 7.1–11.8)

## 2013-07-22 LAB — COPPER, SERUM: Copper: 145 ug/dL (ref 70–175)

## 2013-07-22 LAB — IMMUNOFIXATION ELECTROPHORESIS: Total Protein, Serum Electrophoresis: 7.2 g/dL (ref 6.0–8.3)

## 2013-07-23 DIAGNOSIS — I10 Essential (primary) hypertension: Secondary | ICD-10-CM | POA: Diagnosis not present

## 2013-07-23 DIAGNOSIS — L8995 Pressure ulcer of unspecified site, unstageable: Secondary | ICD-10-CM | POA: Diagnosis not present

## 2013-07-23 DIAGNOSIS — L89109 Pressure ulcer of unspecified part of back, unspecified stage: Secondary | ICD-10-CM | POA: Diagnosis not present

## 2013-07-23 DIAGNOSIS — M1990 Unspecified osteoarthritis, unspecified site: Secondary | ICD-10-CM | POA: Diagnosis not present

## 2013-07-23 DIAGNOSIS — G609 Hereditary and idiopathic neuropathy, unspecified: Secondary | ICD-10-CM | POA: Diagnosis not present

## 2013-07-26 DIAGNOSIS — G609 Hereditary and idiopathic neuropathy, unspecified: Secondary | ICD-10-CM | POA: Diagnosis not present

## 2013-07-26 DIAGNOSIS — L8995 Pressure ulcer of unspecified site, unstageable: Secondary | ICD-10-CM | POA: Diagnosis not present

## 2013-07-26 DIAGNOSIS — M1990 Unspecified osteoarthritis, unspecified site: Secondary | ICD-10-CM | POA: Diagnosis not present

## 2013-07-26 DIAGNOSIS — I10 Essential (primary) hypertension: Secondary | ICD-10-CM | POA: Diagnosis not present

## 2013-07-26 DIAGNOSIS — L89109 Pressure ulcer of unspecified part of back, unspecified stage: Secondary | ICD-10-CM | POA: Diagnosis not present

## 2013-07-27 ENCOUNTER — Other Ambulatory Visit: Payer: Self-pay | Admitting: Neurology

## 2013-07-27 ENCOUNTER — Telehealth: Payer: Self-pay | Admitting: Neurology

## 2013-07-27 DIAGNOSIS — M1710 Unilateral primary osteoarthritis, unspecified knee: Secondary | ICD-10-CM | POA: Diagnosis not present

## 2013-07-27 MED ORDER — CARBIDOPA-LEVODOPA 25-100 MG PO TABS: 1.0000 | ORAL_TABLET | Freq: Every day | ORAL | Status: DC

## 2013-07-27 NOTE — Telephone Encounter (Signed)
Pt requesting refill of her sinemet.  Has f/u scheduled 10/28

## 2013-07-29 DIAGNOSIS — L8995 Pressure ulcer of unspecified site, unstageable: Secondary | ICD-10-CM | POA: Diagnosis not present

## 2013-07-29 DIAGNOSIS — G609 Hereditary and idiopathic neuropathy, unspecified: Secondary | ICD-10-CM | POA: Diagnosis not present

## 2013-07-29 DIAGNOSIS — I10 Essential (primary) hypertension: Secondary | ICD-10-CM | POA: Diagnosis not present

## 2013-07-29 DIAGNOSIS — L89109 Pressure ulcer of unspecified part of back, unspecified stage: Secondary | ICD-10-CM | POA: Diagnosis not present

## 2013-07-29 DIAGNOSIS — M1990 Unspecified osteoarthritis, unspecified site: Secondary | ICD-10-CM | POA: Diagnosis not present

## 2013-08-02 DIAGNOSIS — I10 Essential (primary) hypertension: Secondary | ICD-10-CM | POA: Diagnosis not present

## 2013-08-02 DIAGNOSIS — L8995 Pressure ulcer of unspecified site, unstageable: Secondary | ICD-10-CM | POA: Diagnosis not present

## 2013-08-02 DIAGNOSIS — M1990 Unspecified osteoarthritis, unspecified site: Secondary | ICD-10-CM | POA: Diagnosis not present

## 2013-08-02 DIAGNOSIS — G609 Hereditary and idiopathic neuropathy, unspecified: Secondary | ICD-10-CM | POA: Diagnosis not present

## 2013-08-02 DIAGNOSIS — L89109 Pressure ulcer of unspecified part of back, unspecified stage: Secondary | ICD-10-CM | POA: Diagnosis not present

## 2013-08-03 DIAGNOSIS — M1710 Unilateral primary osteoarthritis, unspecified knee: Secondary | ICD-10-CM | POA: Diagnosis not present

## 2013-08-06 DIAGNOSIS — L89109 Pressure ulcer of unspecified part of back, unspecified stage: Secondary | ICD-10-CM | POA: Diagnosis not present

## 2013-08-06 DIAGNOSIS — M1990 Unspecified osteoarthritis, unspecified site: Secondary | ICD-10-CM | POA: Diagnosis not present

## 2013-08-06 DIAGNOSIS — I10 Essential (primary) hypertension: Secondary | ICD-10-CM | POA: Diagnosis not present

## 2013-08-06 DIAGNOSIS — L8995 Pressure ulcer of unspecified site, unstageable: Secondary | ICD-10-CM | POA: Diagnosis not present

## 2013-08-06 DIAGNOSIS — G609 Hereditary and idiopathic neuropathy, unspecified: Secondary | ICD-10-CM | POA: Diagnosis not present

## 2013-08-09 DIAGNOSIS — M1990 Unspecified osteoarthritis, unspecified site: Secondary | ICD-10-CM | POA: Diagnosis not present

## 2013-08-09 DIAGNOSIS — I10 Essential (primary) hypertension: Secondary | ICD-10-CM | POA: Diagnosis not present

## 2013-08-09 DIAGNOSIS — L89109 Pressure ulcer of unspecified part of back, unspecified stage: Secondary | ICD-10-CM | POA: Diagnosis not present

## 2013-08-09 DIAGNOSIS — L8995 Pressure ulcer of unspecified site, unstageable: Secondary | ICD-10-CM | POA: Diagnosis not present

## 2013-08-09 DIAGNOSIS — G609 Hereditary and idiopathic neuropathy, unspecified: Secondary | ICD-10-CM | POA: Diagnosis not present

## 2013-08-10 ENCOUNTER — Encounter: Payer: Self-pay | Admitting: Neurology

## 2013-08-10 DIAGNOSIS — K219 Gastro-esophageal reflux disease without esophagitis: Secondary | ICD-10-CM | POA: Diagnosis not present

## 2013-08-10 DIAGNOSIS — I10 Essential (primary) hypertension: Secondary | ICD-10-CM | POA: Diagnosis not present

## 2013-08-10 DIAGNOSIS — M1710 Unilateral primary osteoarthritis, unspecified knee: Secondary | ICD-10-CM | POA: Diagnosis not present

## 2013-08-10 DIAGNOSIS — R1310 Dysphagia, unspecified: Secondary | ICD-10-CM | POA: Diagnosis not present

## 2013-08-10 DIAGNOSIS — G2589 Other specified extrapyramidal and movement disorders: Secondary | ICD-10-CM | POA: Diagnosis not present

## 2013-08-10 DIAGNOSIS — F0390 Unspecified dementia without behavioral disturbance: Secondary | ICD-10-CM | POA: Diagnosis not present

## 2013-08-11 DIAGNOSIS — I10 Essential (primary) hypertension: Secondary | ICD-10-CM | POA: Diagnosis not present

## 2013-08-11 DIAGNOSIS — L89109 Pressure ulcer of unspecified part of back, unspecified stage: Secondary | ICD-10-CM | POA: Diagnosis not present

## 2013-08-11 DIAGNOSIS — M1990 Unspecified osteoarthritis, unspecified site: Secondary | ICD-10-CM | POA: Diagnosis not present

## 2013-08-11 DIAGNOSIS — L8995 Pressure ulcer of unspecified site, unstageable: Secondary | ICD-10-CM | POA: Diagnosis not present

## 2013-08-11 DIAGNOSIS — G609 Hereditary and idiopathic neuropathy, unspecified: Secondary | ICD-10-CM | POA: Diagnosis not present

## 2013-08-20 ENCOUNTER — Other Ambulatory Visit: Payer: Self-pay | Admitting: Neurology

## 2013-08-23 MED ORDER — CLONAZEPAM 0.5 MG PO TABS: 0.5000 mg | ORAL_TABLET | Freq: Every evening | ORAL | Status: DC | PRN

## 2013-08-24 ENCOUNTER — Encounter: Payer: Self-pay | Admitting: Neurology

## 2013-08-24 ENCOUNTER — Ambulatory Visit (INDEPENDENT_AMBULATORY_CARE_PROVIDER_SITE_OTHER): Payer: Medicare Other | Admitting: Neurology

## 2013-08-24 VITALS — BP 110/60 | HR 58 | Temp 98.1°F | Resp 12 | Ht 62.0 in | Wt 151.2 lb

## 2013-08-24 DIAGNOSIS — G2581 Restless legs syndrome: Secondary | ICD-10-CM

## 2013-08-24 DIAGNOSIS — N390 Urinary tract infection, site not specified: Secondary | ICD-10-CM | POA: Diagnosis not present

## 2013-08-24 MED ORDER — CARBIDOPA-LEVODOPA 25-100 MG PO TABS: 1.0000 | ORAL_TABLET | Freq: Every day | ORAL | Status: DC

## 2013-08-24 MED ORDER — CLONAZEPAM 0.5 MG PO TABS: 0.5000 mg | ORAL_TABLET | Freq: Every evening | ORAL | Status: DC | PRN

## 2013-08-24 NOTE — Patient Instructions (Addendum)
1.  Take Aricept 10mg  in the morning to see if nightmares improve 2.  Continue sinemet 25/100 1 tablet at bedtime and clonazepam 0.5mg  at bedtime 3.  Return to clinic in 44-months

## 2013-08-24 NOTE — Progress Notes (Signed)
[facility] Neurology Division  Follow-up Visit   Date: 08/24/2013    Stacey Jefferson MRN: [id number] DOB: September 16, 1925   Interim History: Stacey Jefferson is a 79 y.o. year-old right-handed Caucasian female with history of peripheral neuropathy, RLS, CN III palsy, and esphageal stricture, and GERD here for follow-up of RLS. She was last seen in the 07/20/2013.  She is accompanied by her two daughters.  She stopped mirapex in August which helped with her daytime sleepiness.  She has sinemet 25/100 at bedtime and clonazepam 0.5mg  which seems to control her RLS symptoms.  No falls or hospitalizations since her last visit.   She continues to have generalized joint pain of her hands and feet due to arthritis.  No new complaints today.  She is requesting refills on her RLS medications.  She currently lives alone and has an aide that comes once per week. Her daughter also helps to take care of her needs.   History of present illness: She was previously followed by Dr. Sandria Manly who had been managing her RLS for the past 10 years and recently saw Dr. Terrace Arabia at [facility] in June 2014 for the same symptoms. Per Dr. Zannie Cove clinic note, patient has had a normal EMG in 2001. She has was on Requip and then started Mirapex in 2001. In June 2014, she was taking mirapex 0.5mg  TID, sinemet 25/100 qhs, and clonazepam 0.5 mg once at night without significant change in her symptoms.  She complains of involuntary movements of the legs when at rest. For instance, she is at church her legs will start jumping. Mirapex was stopped in the summer because she was complaining of excessive day-time sleepiness.  Medications:  Current Outpatient Prescriptions on File Prior to Visit  Medication Sig Dispense Refill  . acetaminophen (TYLENOL 8 HOUR) 650 MG CR tablet Take 650 mg by mouth 2 (two) times daily.      . bisoprolol-hydrochlorothiazide (ZIAC) 5-6.25 MG per tablet       . carbidopa-levodopa (SINEMET) 25-100 MG per tablet Take 1  tablet by mouth at bedtime.  30 tablet  12  . clonazePAM (KLONOPIN) 0.5 MG tablet Take 1 tablet (0.5 mg total) by mouth at bedtime as needed.  90 tablet  1  . Cyanocobalamin (VITAMIN B 12 PO) Take by mouth as directed.      . diphenoxylate-atropine (LOMOTIL) 2.5-0.025 MG per tablet       . donepezil (ARICEPT) 10 MG tablet Take 5 mg by mouth daily.       . enalapril (VASOTEC) 10 MG tablet       . FLOVENT HFA 220 MCG/ACT inhaler       . omeprazole (PRILOSEC) 20 MG capsule       . PREMARIN 0.625 MG tablet       . Salicylic Acid (SALACYN) 6 % LOTN Apply topically as directed.       No current facility-administered medications on file prior to visit.    Allergies:  Allergies  Allergen Reactions  . Sulfa Antibiotics      Review of Systems:  CONSTITUTIONAL: No fevers, chills, night sweats, or weight loss.   EYES: No visual changes or eye pain ENT: No hearing changes.  No history of nose bleeds.   RESPIRATORY: No cough, wheezing and shortness of breath.   CARDIOVASCULAR: Negative for chest pain, and palpitations.   GI: Negative for abdominal discomfort, blood in stools or black stools.  No recent change in bowel habits.   GU:  No history  of incontinence.   MUSCLOSKELETAL:+ joint pain or swelling.  No myalgias.   SKIN: Negative for lesions, rash, and itching.   HEMATOLOGY/ONCOLOGY: Negative for prolonged bleeding, bruising easily, and swollen nodes.   ENDOCRINE: Negative for cold or heat intolerance, polydipsia or goiter.   PSYCH:  No depression or anxiety symptoms.   NEURO: As Above.   Vital Signs:  BP 110/60  Pulse 58  Temp(Src) 98.1 F (36.7 C)  Resp 12  Ht 5\' 2"  (1.575 m)  Wt 151 lb 3.2 oz (68.584 kg)  BMI 27.65 kg/m2  Neurological Exam:  MENTAL STATUS including orientation to time, place, person, recent and remote memory, attention span and concentration, language, and fund of knowledge is fair. Tangential thought content.  Speech is not dysarthric.   CRANIAL NERVES:   II: No visual field defects.  III-IV-VI: Pupils equal round and reactive to light. CN III palsy on left side with limited medial gaze, downward gaze, and upgaze (old). There is moderatel left eye ptosis. Extraocular muscles of the right eye is intact.  V: Normal facial sensation.   VII: Normal facial symmetry and movements. Snout, Myerson's sign, and palmomental reflex present bilaterally.  IX-X: Normal palatal movement.  XI: Normal shoulder shrug and head rotation.  XII: Normal tongue strength and range of motion, no deviation or fasciculation.   MOTOR: Mild generalized atrophy. Motor strength of the proximal upper extremities is 4/5, 5/5 distally in the arms.  Lower extremities are both 4+/5.  No fasciculations or abnormal movements. No pronator drift. Tone is normal.   MSRs:  Right Left  brachioradialis  1+   brachioradialis  1+   biceps  1+   biceps  1+   triceps  1+   triceps  1+   patellar  0   patellar  0   ankle jerk  0   ankle jerk  0   Hoffman  no   Hoffman  no   plantar response  mute   plantar response  mute    SENSORY:  Reduced vibration at the knees with reduced pin prick distal to mid-thighs and hands bilaterally.  COORDINATION/GAIT: Mild dysmetria with finger-to- nose-finger. Movements are slowed throughout. Unable to rise from a chair without using arms. Gait is wide-based and assisted with walker.    DATA: Component     Latest Ref Rng 07/20/2013  Copper     70 - 175 mcg/dL 161  Ceruloplasmin     20 - 60 mg/dL 37  T3, Free     2.3 - 4.2 pg/mL 2.7  Free T4     0.60 - 1.60 ng/dL 0.96  Methylmalonic Acid, Quant     <0.40 umol/L 0.18  Vitamin B-12     211 - 911 pg/mL 1480 (H)  Hemoglobin A1C     4.6 - 6.5 % 5.3  Ferritin     10.0 - 291.0 ng/mL 98.6  TSH     0.35 - 5.50 uIU/mL 1.23    IMPRESSION/PLAN:  1.  Restless leg syndrome; clinically stable  - Previously tried mirapex and requip, but stopped during summer 2014 due to excessive sleepiness  -  Ferritin 91 (06/2013)  - Started on sinemet 25/100 by Dr. Terrace Arabia in 05/2013 which seems to be managing symptoms  - Continue clonazepam 0.5mg  at bedtime  - Refills for 90-day supply of sinemet and clonazepam given 2.  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  Memory loss  - Continue Aricept 10mg , because of nightmares recommend taking it during the day 4.  Return to clinic in 91-months  The duration of this appointment visit was 30 minutes of face-to-face time with the patient.  Greater than 50% of this time was spent in counseling, explanation of diagnosis, planning of further management, and coordination of care.   Thank you for allowing me to participate in patient's care.  If I can answer any additional questions, I would be pleased to do so.    Sincerely,    Kenley Rettinger K. Allena Katz, DO

## 2013-09-02 DIAGNOSIS — Z87891 Personal history of nicotine dependence: Secondary | ICD-10-CM | POA: Diagnosis not present

## 2013-09-02 DIAGNOSIS — B3781 Candidal esophagitis: Secondary | ICD-10-CM | POA: Diagnosis not present

## 2013-09-02 DIAGNOSIS — K219 Gastro-esophageal reflux disease without esophagitis: Secondary | ICD-10-CM | POA: Diagnosis not present

## 2013-09-02 DIAGNOSIS — M129 Arthropathy, unspecified: Secondary | ICD-10-CM | POA: Diagnosis not present

## 2013-09-02 DIAGNOSIS — I10 Essential (primary) hypertension: Secondary | ICD-10-CM | POA: Diagnosis not present

## 2013-09-02 DIAGNOSIS — R634 Abnormal weight loss: Secondary | ICD-10-CM | POA: Diagnosis not present

## 2013-09-02 DIAGNOSIS — H00019 Hordeolum externum unspecified eye, unspecified eyelid: Secondary | ICD-10-CM | POA: Diagnosis not present

## 2013-09-02 DIAGNOSIS — G839 Paralytic syndrome, unspecified: Secondary | ICD-10-CM | POA: Diagnosis not present

## 2013-09-02 DIAGNOSIS — K222 Esophageal obstruction: Secondary | ICD-10-CM | POA: Diagnosis not present

## 2013-09-02 DIAGNOSIS — R1310 Dysphagia, unspecified: Secondary | ICD-10-CM | POA: Diagnosis not present

## 2013-09-02 DIAGNOSIS — K200 Eosinophilic esophagitis: Secondary | ICD-10-CM | POA: Diagnosis not present

## 2013-09-02 DIAGNOSIS — Z961 Presence of intraocular lens: Secondary | ICD-10-CM | POA: Diagnosis not present

## 2013-09-07 DIAGNOSIS — J209 Acute bronchitis, unspecified: Secondary | ICD-10-CM | POA: Diagnosis not present

## 2013-09-16 ENCOUNTER — Emergency Department (HOSPITAL_COMMUNITY): Payer: Medicare Other

## 2013-09-16 ENCOUNTER — Encounter (HOSPITAL_COMMUNITY): Payer: Self-pay | Admitting: Emergency Medicine

## 2013-09-16 ENCOUNTER — Observation Stay (HOSPITAL_COMMUNITY)
Admission: EM | Admit: 2013-09-16 | Discharge: 2013-09-18 | Disposition: A | Discharge status: Home / Self Care | Payer: Medicare Other | Attending: Internal Medicine | Admitting: Internal Medicine

## 2013-09-16 DIAGNOSIS — G5600 Carpal tunnel syndrome, unspecified upper limb: Secondary | ICD-10-CM | POA: Diagnosis not present

## 2013-09-16 DIAGNOSIS — L8992 Pressure ulcer of unspecified site, stage 2: Secondary | ICD-10-CM | POA: Insufficient documentation

## 2013-09-16 DIAGNOSIS — R7989 Other specified abnormal findings of blood chemistry: Secondary | ICD-10-CM

## 2013-09-16 DIAGNOSIS — R634 Abnormal weight loss: Secondary | ICD-10-CM | POA: Diagnosis not present

## 2013-09-16 DIAGNOSIS — G2581 Restless legs syndrome: Secondary | ICD-10-CM | POA: Diagnosis not present

## 2013-09-16 DIAGNOSIS — R5381 Other malaise: Principal | ICD-10-CM | POA: Insufficient documentation

## 2013-09-16 DIAGNOSIS — R269 Unspecified abnormalities of gait and mobility: Secondary | ICD-10-CM

## 2013-09-16 DIAGNOSIS — R262 Difficulty in walking, not elsewhere classified: Secondary | ICD-10-CM

## 2013-09-16 DIAGNOSIS — S51809A Unspecified open wound of unspecified forearm, initial encounter: Secondary | ICD-10-CM | POA: Diagnosis not present

## 2013-09-16 DIAGNOSIS — E860 Dehydration: Secondary | ICD-10-CM | POA: Diagnosis not present

## 2013-09-16 DIAGNOSIS — G609 Hereditary and idiopathic neuropathy, unspecified: Secondary | ICD-10-CM | POA: Insufficient documentation

## 2013-09-16 DIAGNOSIS — L89309 Pressure ulcer of unspecified buttock, unspecified stage: Secondary | ICD-10-CM | POA: Diagnosis not present

## 2013-09-16 DIAGNOSIS — I959 Hypotension, unspecified: Secondary | ICD-10-CM | POA: Diagnosis not present

## 2013-09-16 DIAGNOSIS — K589 Irritable bowel syndrome without diarrhea: Secondary | ICD-10-CM | POA: Diagnosis not present

## 2013-09-16 DIAGNOSIS — R1110 Vomiting, unspecified: Secondary | ICD-10-CM | POA: Diagnosis present

## 2013-09-16 DIAGNOSIS — R1310 Dysphagia, unspecified: Secondary | ICD-10-CM

## 2013-09-16 DIAGNOSIS — E876 Hypokalemia: Secondary | ICD-10-CM | POA: Diagnosis not present

## 2013-09-16 DIAGNOSIS — R55 Syncope and collapse: Secondary | ICD-10-CM

## 2013-09-16 DIAGNOSIS — S3981XA Other specified injuries of abdomen, initial encounter: Secondary | ICD-10-CM | POA: Diagnosis not present

## 2013-09-16 DIAGNOSIS — R1033 Periumbilical pain: Secondary | ICD-10-CM | POA: Diagnosis not present

## 2013-09-16 DIAGNOSIS — R109 Unspecified abdominal pain: Secondary | ICD-10-CM | POA: Insufficient documentation

## 2013-09-16 DIAGNOSIS — E872 Acidosis: Secondary | ICD-10-CM

## 2013-09-16 DIAGNOSIS — T1490XA Injury, unspecified, initial encounter: Secondary | ICD-10-CM | POA: Diagnosis not present

## 2013-09-16 DIAGNOSIS — R197 Diarrhea, unspecified: Secondary | ICD-10-CM | POA: Insufficient documentation

## 2013-09-16 DIAGNOSIS — R413 Other amnesia: Secondary | ICD-10-CM | POA: Diagnosis not present

## 2013-09-16 DIAGNOSIS — K219 Gastro-esophageal reflux disease without esophagitis: Secondary | ICD-10-CM | POA: Insufficient documentation

## 2013-09-16 DIAGNOSIS — Z87891 Personal history of nicotine dependence: Secondary | ICD-10-CM | POA: Diagnosis not present

## 2013-09-16 DIAGNOSIS — N179 Acute kidney failure, unspecified: Secondary | ICD-10-CM | POA: Diagnosis present

## 2013-09-16 LAB — COMPREHENSIVE METABOLIC PANEL
Albumin: 3.8 g/dL (ref 3.5–5.2)
BUN: 12 mg/dL (ref 6–23)
Chloride: 96 mEq/L (ref 96–112)
Creatinine, Ser: 1.19 mg/dL — ABNORMAL HIGH (ref 0.50–1.10)
GFR calc non Af Amer: 40 mL/min — ABNORMAL LOW (ref >90)
Glucose, Bld: 108 mg/dL — ABNORMAL HIGH (ref 70–99)
Potassium: 2.8 mEq/L — ABNORMAL LOW (ref 3.5–5.1)
Total Bilirubin: 0.9 mg/dL (ref 0.3–1.2)

## 2013-09-16 LAB — CBC WITH DIFFERENTIAL/PLATELET
Basophils Absolute: 0 10*3/uL (ref 0.0–0.1)
Basophils Relative: 0 % (ref 0–1)
Lymphs Abs: 1.2 10*3/uL (ref 0.7–4.0)
MCHC: 36.1 g/dL — ABNORMAL HIGH (ref 30.0–36.0)
MCV: 92.9 fL (ref 78.0–100.0)
Neutro Abs: 9.6 10*3/uL — ABNORMAL HIGH (ref 1.7–7.7)
Neutrophils Relative %: 84 % — ABNORMAL HIGH (ref 43–77)
RDW: 12.2 % (ref 11.5–15.5)

## 2013-09-16 LAB — CREATININE, SERUM: GFR calc Af Amer: 49 mL/min — ABNORMAL LOW (ref >90)

## 2013-09-16 LAB — URINALYSIS, ROUTINE W REFLEX MICROSCOPIC
Bilirubin Urine: NEGATIVE
Hgb urine dipstick: NEGATIVE
Ketones, ur: NEGATIVE mg/dL
Leukocytes, UA: NEGATIVE
Nitrite: NEGATIVE
Protein, ur: NEGATIVE mg/dL

## 2013-09-16 LAB — TSH: TSH: 1.755 u[IU]/mL (ref 0.350–4.500)

## 2013-09-16 LAB — VITAMIN B12: Vitamin B-12: >2000 pg/mL — ABNORMAL HIGH (ref 211–911)

## 2013-09-16 LAB — CBC
HCT: 35.9 % — ABNORMAL LOW (ref 36.0–46.0)
MCH: 33.4 pg (ref 26.0–34.0)
MCHC: 35.7 g/dL (ref 30.0–36.0)
MCV: 93.7 fL (ref 78.0–100.0)
Platelets: 156 10*3/uL (ref 150–400)
RBC: 3.83 MIL/uL — ABNORMAL LOW (ref 3.87–5.11)
RDW: 12.4 % (ref 11.5–15.5)
WBC: 8.5 10*3/uL (ref 4.0–10.5)

## 2013-09-16 LAB — LIPASE, BLOOD: Lipase: 20 U/L (ref 11–59)

## 2013-09-16 LAB — POCT I-STAT TROPONIN I: Troponin i, poc: 0.01 ng/mL (ref 0.00–0.08)

## 2013-09-16 LAB — MAGNESIUM: Magnesium: 1.9 mg/dL (ref 1.5–2.5)

## 2013-09-16 MED ORDER — IOHEXOL 300 MG/ML  SOLN
80.0000 mL | Freq: Once | INTRAMUSCULAR | Status: AC | PRN
  Administered 2013-09-16: 80 mL via INTRAVENOUS

## 2013-09-16 MED ORDER — ACETAMINOPHEN 650 MG RE SUPP: 650.0000 mg | Freq: Four times a day (QID) | RECTAL | Status: DC | PRN

## 2013-09-16 MED ORDER — SODIUM CHLORIDE 0.9 % IV SOLN: 1000.0000 mL | Freq: Once | INTRAVENOUS | Status: DC

## 2013-09-16 MED ORDER — POTASSIUM CHLORIDE IN NACL 40-0.9 MEQ/L-% IV SOLN
INTRAVENOUS | Status: DC
  Administered 2013-09-16 – 2013-09-17 (×3): via INTRAVENOUS
  Filled 2013-09-16 (×6): qty 1000

## 2013-09-16 MED ORDER — CLONAZEPAM 0.5 MG PO TABS: 0.5000 mg | ORAL_TABLET | Freq: Every evening | ORAL | Status: DC | PRN

## 2013-09-16 MED ORDER — MORPHINE SULFATE 2 MG/ML IJ SOLN: 1.0000 mg | INTRAMUSCULAR | Status: DC | PRN

## 2013-09-16 MED ORDER — CIPROFLOXACIN 500 MG/5ML (10%) PO SUSR
500.0000 mg | Freq: Two times a day (BID) | ORAL | Status: DC
  Filled 2013-09-16 (×3): qty 5

## 2013-09-16 MED ORDER — SODIUM CHLORIDE 0.9 % IV BOLUS (SEPSIS)
500.0000 mL | Freq: Once | INTRAVENOUS | Status: AC
  Administered 2013-09-16: 500 mL via INTRAVENOUS

## 2013-09-16 MED ORDER — ONDANSETRON HCL 4 MG/2ML IJ SOLN: 4.0000 mg | Freq: Once | INTRAMUSCULAR | Status: DC

## 2013-09-16 MED ORDER — SODIUM CHLORIDE 0.9 % IV SOLN: 1000.0000 mL | INTRAVENOUS | Status: DC

## 2013-09-16 MED ORDER — SODIUM CHLORIDE 0.9 % IV SOLN
1000.0000 mL | Freq: Once | INTRAVENOUS | Status: AC
  Administered 2013-09-16: 1000 mL via INTRAVENOUS

## 2013-09-16 MED ORDER — KCL IN DEXTROSE-NACL 30-5-0.45 MEQ/L-%-% IV SOLN
INTRAVENOUS | Status: DC
  Filled 2013-09-16 (×2): qty 1000

## 2013-09-16 MED ORDER — DONEPEZIL HCL 5 MG PO TABS
5.0000 mg | ORAL_TABLET | Freq: Every day | ORAL | Status: DC
  Administered 2013-09-16 – 2013-09-17 (×2): 5 mg via ORAL
  Filled 2013-09-16 (×3): qty 1

## 2013-09-16 MED ORDER — OMEPRAZOLE 2 MG/ML ORAL SUSPENSION
20.0000 mg | Freq: Every day | ORAL | Status: DC
  Administered 2013-09-16 – 2013-09-18 (×2): 20 mg via ORAL
  Filled 2013-09-16 (×4): qty 10

## 2013-09-16 MED ORDER — CARBIDOPA-LEVODOPA 25-100 MG PO TABS
1.0000 | ORAL_TABLET | Freq: Every day | ORAL | Status: DC
  Administered 2013-09-16 – 2013-09-17 (×2): 1 via ORAL
  Filled 2013-09-16 (×3): qty 1

## 2013-09-16 MED ORDER — ACETAMINOPHEN 325 MG PO TABS
650.0000 mg | ORAL_TABLET | Freq: Two times a day (BID) | ORAL | Status: DC
  Administered 2013-09-16: 650 mg via ORAL
  Filled 2013-09-16 (×4): qty 2

## 2013-09-16 MED ORDER — POTASSIUM CHLORIDE 20 MEQ/15ML (10%) PO LIQD
40.0000 meq | Freq: Once | ORAL | Status: AC
  Administered 2013-09-16: 40 meq via ORAL
  Filled 2013-09-16: qty 30

## 2013-09-16 MED ORDER — ACETAMINOPHEN 325 MG PO TABS: 650.0000 mg | ORAL_TABLET | Freq: Four times a day (QID) | ORAL | Status: DC | PRN

## 2013-09-16 MED ORDER — POTASSIUM CHLORIDE CRYS ER 20 MEQ PO TBCR: 40.0000 meq | EXTENDED_RELEASE_TABLET | Freq: Once | ORAL | Status: DC

## 2013-09-16 MED ORDER — HEPARIN SODIUM (PORCINE) 5000 UNIT/ML IJ SOLN
5000.0000 [IU] | Freq: Three times a day (TID) | INTRAMUSCULAR | Status: DC
  Administered 2013-09-16 – 2013-09-18 (×6): 5000 [IU] via SUBCUTANEOUS
  Filled 2013-09-16 (×9): qty 1

## 2013-09-16 MED ORDER — SODIUM CHLORIDE 0.9 % IJ SOLN: 3.0000 mL | Freq: Two times a day (BID) | INTRAMUSCULAR | Status: DC

## 2013-09-16 MED ORDER — SALICYLIC ACID 6 % EX LOTN: TOPICAL_LOTION | CUTANEOUS | Status: DC

## 2013-09-16 MED ORDER — DIPHENOXYLATE-ATROPINE 2.5-0.025 MG PO TABS: 2.0000 | ORAL_TABLET | Freq: Four times a day (QID) | ORAL | Status: DC | PRN

## 2013-09-16 MED ORDER — FLUCONAZOLE 40 MG/ML PO SUSR
200.0000 mg | Freq: Every day | ORAL | Status: DC
  Administered 2013-09-16 – 2013-09-18 (×3): 200 mg via ORAL
  Filled 2013-09-16 (×4): qty 5

## 2013-09-16 NOTE — ED Notes (Signed)
Patient with fall today at home stating legs " just gave out on her", patient states she has not been able to eat lately due to IBS and esophageal strictures.  Patient also with diarrhea x 7days due to being on Cipro for URI.  Patient a/o x 4 and denies loc today post fall.

## 2013-09-16 NOTE — H&P (Signed)
[facility] History and Physical  Stacey Jefferson RUE:[id number] DOB: 09-03-1925 DOA: 09/16/2013  Referring physician:  PCP: Georgann Housekeeper, MD  Specialists:   Chief Complaint: Fall and weakness  HPI: Stacey Jefferson is a 79 y.o. female  With past medical history of IBS, dysphagia with esophageal spasms, polyneuropathy, that presents to the emergency department after falling at home today. Patient states that this morning she felt the need to have diarrhea, as she walked to the bathroom with a walker she felt weak and fell. The patient denies losing any consciousness or dizziness. Patient states that over the past few weeks she has had increasing diarrhea approximately 2-3 episodes per day. She has had long-standing diarrhea and has been seeing a gastroenterologist, Dr. Lou Miner at [facility]. She was scheduled to have an EGD for esophageal spasms as well as dysphagia on 09/17/2013. Patient states that she does have vomiting or "upchucking" due to her esophageal strictures. She does state that she can't eat food that are thickened and pured. Patient has had some weakness over the last few days due to her increased diarrhea. She denies any sick contacts or recent travel.  Of note, the patient has been on fluconazole for esophageal candidiasis. She is to continue for 21 days, since 09/03/2013. She's also been placed on ciprofloxacin for questionable bronchitis for which she is to continue for another 2 days.   Review of Systems:  Constitutional: Denies fever, chills, diaphoresis, appetite change and fatigue.  HEENT: Denies photophobia, eye pain, redness, hearing loss, ear pain, congestion, sore throat, rhinorrhea, sneezing, mouth sores, trouble swallowing, neck pain, neck stiffness and tinnitus.   Respiratory: Denies SOB, DOE, cough, chest tightness,  and wheezing.   Cardiovascular: Denies chest pain, palpitations and leg swelling.  Gastrointestinal: Denies nausea,  abdominal pain, constipation, blood  in stool and abdominal distention. Patient admits to "upchucking" and diarrhea with IBS. Genitourinary: Denies dysuria, urgency, frequency, hematuria, flank pain and difficulty urinating.  Musculoskeletal: Denies myalgias, back pain, joint swelling, arthralgias and gait problem.  Skin: Denies pallor, rash and wound.  Neurological: Denies dizziness, seizures, syncope, weakness, light-headedness, numbness and headaches.  Hematological: Denies adenopathy. Easy bruising, personal or family bleeding history  Psychiatric/Behavioral: Denies suicidal ideation, mood changes, confusion, nervousness, sleep disturbance and agitation  Past Medical History  Diagnosis Date  . Weight loss   . Vomiting   . Dysphagia, unspecified(787.20)   . Memory loss   . Hypotension, unspecified   . Irritable bowel syndrome   . Restless legs syndrome (RLS)   . Carpal tunnel syndrome   . Abnormality of gait   . Headache   . Pain in limb   . Polyneuropathy in other diseases classified elsewhere    Past Surgical History  Procedure Laterality Date  . Bladder aspiration    . Carpal tunnel release Right   . Squamous cell carcinoma excision     Social History:  reports that she has quit smoking. Her smoking use included Cigarettes. She smoked 0.00 packs per day. She has never used smokeless tobacco. She reports that she drinks alcohol. She reports that she does not use illicit drugs. Lives alone at home.  Daughter has been staying with her for the past few weeks due to her multiple illnesses with bronchitis and urinary tract infection.  Allergies  Allergen Reactions  . Sulfa Antibiotics     Family History  Problem Relation Age of Onset  . Dementia Mother   . Stroke Father     Prior to Admission  medications   Medication Sig Start Date End Date Taking? Authorizing Provider  acetaminophen (TYLENOL 8 HOUR) 650 MG CR tablet Take 650 mg by mouth 2 (two) times daily.   Yes Historical Provider, MD   bisoprolol-hydrochlorothiazide [facility]) 5-6.25 MG per tablet  04/12/13  Yes Historical Provider, MD  carbidopa-levodopa (SINEMET) 25-100 MG per tablet Take 1 tablet by mouth at bedtime. 08/24/13  Yes Glendale Chard, MD  ciprofloxacin (CIPRO) 500 MG/5ML (10%) suspension Take 500 mg by mouth 2 (two) times daily. Started on 09-08-13 for 10 days.   Yes Historical Provider, MD  clonazePAM (KLONOPIN) 0.5 MG tablet Take 1 tablet (0.5 mg total) by mouth at bedtime as needed. 08/24/13  Yes Glendale Chard, MD  Cyanocobalamin (VITAMIN B 12 PO) Take by mouth as directed.   Yes Historical Provider, MD  diphenoxylate-atropine (LOMOTIL) 2.5-0.025 MG per tablet  04/09/13  Yes Historical Provider, MD  donepezil (ARICEPT) 10 MG tablet Take 5 mg by mouth daily.    Yes Historical Provider, MD  enalapril (VASOTEC) 10 MG tablet Take 5 mg by mouth daily.  04/12/13  Yes Historical Provider, MD  fluconazole (DIFLUCAN) 40 MG/ML suspension Take 200 mg by mouth daily. For 21 days. Stared on 09-03-13   Yes Historical Provider, MD  omeprazole (PRILOSEC) 2 mg/mL SUSP Take 20 mg by mouth daily.   Yes Historical Provider, MD  PREMARIN 0.625 MG tablet  04/12/13  Yes Historical Provider, MD  Salicylic Acid (SALACYN) 6 % LOTN Apply topically as directed.   Yes Historical Provider, MD   Physical Exam: Filed Vitals:   09/16/13 1441  BP: 111/62  Pulse:   Temp:   Resp:      General: Well developed, well nourished, NAD, appears stated age  HEENT: NCAT,  Anicteic Sclera, mucous membranes moist. No pharyngeal erythema or exudates  Neck: Supple, no JVD, no masses  Cardiovascular: S1 S2 auscultated, no rubs, murmurs or gallops. Regular rate and rhythm.  Respiratory: Clear to auscultation bilaterally with equal chest rise  Abdomen: Soft, nontender, nondistended, + bowel sounds  Extremities: warm dry without cyanosis clubbing.  Trace edema in the lower chimneys bilaterally.  Laceration noted to the left forearm with ecchymosis and  bruising.  Neuro: AAOx3, cranial nerves grossly intact, left eye third cranial nerve palsy, Strength patient's upper and lower extremities are equal and bilateral   Skin: Without rashes exudates or nodules  Psych: Normal affect and demeanor with intact judgement and insight  Labs on Admission:  Basic Metabolic Panel:  Recent Labs Lab 09/16/13 1051  NA 137  K 2.8*  CL 96  CO2 27  GLUCOSE 108*  BUN 12  CREATININE 1.19*  CALCIUM 9.4   Liver Function Tests:  Recent Labs Lab 09/16/13 1051  AST 23  ALT 10  ALKPHOS 60  BILITOT 0.9  PROT 7.1  ALBUMIN 3.8    Recent Labs Lab 09/16/13 1051  LIPASE 20   No results found for this basename: AMMONIA,  in the last 168 hours CBC:  Recent Labs Lab 09/16/13 1051  WBC 11.4*  NEUTROABS 9.6*  HGB 13.6  HCT 37.7  MCV 92.9  PLT 178   Cardiac Enzymes: No results found for this basename: CKTOTAL, CKMB, CKMBINDEX, TROPONINI,  in the last 168 hours  BNP (last 3 results) No results found for this basename: PROBNP,  in the last 8760 hours CBG: No results found for this basename: GLUCAP,  in the last 168 hours  Radiological Exams on Admission: Ct Abdomen Pelvis  W Contrast  09/16/2013   CLINICAL DATA:  Fall striking left side, periumbilical and right lower quadrant pain, nausea, vomiting, diarrhea, history irritable bowel syndrome  EXAM: CT ABDOMEN AND PELVIS WITH CONTRAST  TECHNIQUE: Multidetector CT imaging of the abdomen and pelvis was performed using the standard protocol following bolus administration of intravenous contrast. Sagittal and coronal MPR images reconstructed from axial data set.  CONTRAST:  80mL OMNIPAQUE IOHEXOL 300 MG/ML SOLN. No oral contrast administered.  COMPARISON:  None  FINDINGS: Dependent atelectasis at both lung bases.  Scattered streak artifacts at upper in mid abdomen from the patient's arms.  Scattered atherosclerotic calcifications without lytic aortic aneurysm.  Calcified subcarinal and left hilar  adenopathy.  Mild fatty infiltration of liver.  Distended gallbladder without gross wall thickening or surrounding edema.  Slight thickening of the left adrenal gland without discrete mass  Liver, spleen, pancreas, kidneys, and adrenal glands otherwise normal appearance.  Normal appendix.  Stomach and bowel loops unremarkable.  Uterus surgically absent with prominent enhancing vascular structures at left ovary/adnexa; no definite discrete ovarian mass identified.  Unremarkable bladder and ureters.  No mass, adenopathy, free fluid, or inflammatory process.  Bones appear demineralized without definite fracture.  IMPRESSION: No acute intra-abdominal or intrapelvic abnormalities.  Mild fatty infiltration of liver.  Old granulomatous disease within chest.   Electronically Signed   By: Ulyses Southward M.D.   On: 09/16/2013 14:27   Dg Abd Acute W/chest  09/16/2013   CLINICAL DATA:  Weakness, abdominal pain, fall  EXAM: ACUTE ABDOMEN SERIES (ABDOMEN 2 VIEW & CHEST 1 VIEW)  COMPARISON:  06/2012  FINDINGS: Mild cardiac enlargement. Vascular pattern normal. Lungs clear. No free air. No abnormally dilated loops of bowel. No abnormal air-fluid levels. Calcific densities in the pelvis likely represent phleboliths. Vascular calcifications left upper quadrant likely involve splenic artery. Moderate sigmoid scoliotic curvature of the thoracolumbar spine.  IMPRESSION: No acute abnormalities.  Nonobstructive bowel gas pattern.   Electronically Signed   By: Esperanza Heir M.D.   On: 09/16/2013 12:44    EKG: Independently reviewed. Sinus rhythm, rate 61, normal axis, prolonged QT   Assessment/Plan Principal Problem:   Ambulatory dysfunction Active Problems:   Vomiting   Dysphagia, unspecified(787.20)   Irritable bowel syndrome   Abnormality of gait   Diarrhea  Ambulatory dysfunction status post fall Patient be admitted to medical floor with telemetry monitoring. Will also order orthostatic vitals. Will obtain folate and  B12 levels, will consult PT and OT for evaluation and treatment. Patient denies any dizziness. This is likely secondary to dehydration due to her multiple episodes of diarrhea.  Irritable bowel syndrome with chronic diarrhea Will continue patient's home medications, Lomotil.  Will also obtain a C. difficile PCR as patient has been on multiple antibiotic recently. Patient does have a slight leukocytosis of 11.4. Will continue to monitor.  Dysphagia with esophageal spasms Patient is currently being followed by Dr. Jacinto Reap at [facility]. Will hold off on consulting GI for now. Patient has been [facility] in the past however was told that nothing else could be done for her.  Acute kidney injury likely secondary to dehydration Patient's creatinine then is usually less than 1. Current creatinine is 1.19. Will place patient on IV fluids at 75 cc per hour and continue to monitor.  Will not obtain urine electrolytes as patient has been on diuretic which was a few these results.  Dehydration secondary to diarrhea Will place patient on IV fluids and continue to monitor.  Hypokalemia secondary to diarrhea Will replace potassium as well as obtain magnesium and phosphate level. Will continue to monitor her BMP.  Prolonged QTC Will place patient on tele and continue to monitor her.   DVT prophylaxis: Heparin  Code Status: Full  Condition: Guarded  Family Communication: Daughter at bedside. Admission, patients condition and plan of care including tests being ordered have been discussed with the patient and daughter who indicate understanding and agree with the plan and Code Status.  Disposition Plan: Admitted for observation  Time spent: 45 minutes  Jeston Junkins D.O. [facility] Pager [phone]  If 7PM-7AM, please contact night-coverage [email or web address] Password [email or web address] 09/16/2013, 3:10 PM

## 2013-09-16 NOTE — ED Provider Notes (Addendum)
CSN: [id number]     Arrival date & time 09/16/13  1012 History   First MD Initiated Contact with Patient 09/16/13 1013     Chief Complaint  Patient presents with  . Weakness  . Abdominal Pain  . Fall    HPI Comments: Pt has been having trouble with diarrhea and decreased po intake.  She has history of IBS.  In the past day or so she has been having intermittent abdominal pain around the umbilicus towards the right.  Today she had two episodes of that as well although she has no pain now.  She has at least three episodes of diarrhea daily.  No vomiting although she does have some reflux with eating.  She has an esophageal stricture.  She has had a recent uri/cough and was started on cipro. This morning she was trying to get to the bathroom when she felt acutely weak and fell to the ground.  She scraped her left arm in the fall but otherwise no other injuries.   Patient is a 79 y.o. female presenting with weakness, abdominal pain, and fall.  Weakness This is a new problem. Episode onset: this morning. Associated symptoms include abdominal pain. Pertinent negatives include no chest pain, no headaches and no shortness of breath. The symptoms are aggravated by walking. Nothing relieves the symptoms.  Abdominal Pain Associated symptoms: no chest pain, no fever and no shortness of breath   Fall Associated symptoms include abdominal pain. Pertinent negatives include no chest pain, no headaches and no shortness of breath.    Past Medical History  Diagnosis Date  . Weight loss   . Vomiting   . Dysphagia, unspecified(787.20)   . Memory loss   . Hypotension, unspecified   . Irritable bowel syndrome   . Restless legs syndrome (RLS)   . Carpal tunnel syndrome   . Abnormality of gait   . Headache   . Pain in limb   . Polyneuropathy in other diseases classified elsewhere    Past Surgical History  Procedure Laterality Date  . Bladder aspiration    . Carpal tunnel release Right   . Squamous  cell carcinoma excision     Family History  Problem Relation Age of Onset  . Dementia Mother   . Stroke Father    History  Substance Use Topics  . Smoking status: Former Smoker    Types: Cigarettes  . Smokeless tobacco: Never Used     Comment: Quit 60 years ago.  . Alcohol Use: Yes     Comment: Rare glass of wine   OB History   Grav Para Term Preterm Abortions TAB SAB Ect Mult Living                 Review of Systems  Constitutional: Negative for fever.  Respiratory: Negative for shortness of breath.   Cardiovascular: Negative for chest pain.  Gastrointestinal: Positive for abdominal pain. Negative for blood in stool.  Musculoskeletal: Negative for neck pain.  Skin: Negative for rash.  Neurological: Positive for weakness. Negative for headaches.  Psychiatric/Behavioral: Negative for confusion.  All other systems reviewed and are negative.    Allergies  Sulfa antibiotics  Home Medications   Current Outpatient Rx  Name  Route  Sig  Dispense  Refill  . acetaminophen (TYLENOL 8 HOUR) 650 MG CR tablet   Oral   Take 650 mg by mouth 2 (two) times daily.         . bisoprolol-hydrochlorothiazide (ZIAC) 5-6.25 MG per  tablet               . carbidopa-levodopa (SINEMET) 25-100 MG per tablet   Oral   Take 1 tablet by mouth at bedtime.   90 tablet   3   . ciprofloxacin (CIPRO) 500 MG/5ML (10%) suspension   Oral   Take 500 mg by mouth 2 (two) times daily. Started on 09-08-13 for 10 days.         . clonazePAM (KLONOPIN) 0.5 MG tablet   Oral   Take 1 tablet (0.5 mg total) by mouth at bedtime as needed.   90 tablet   3     Pharmacy Fax [phone]   . Cyanocobalamin (VITAMIN B 12 PO)   Oral   Take by mouth as directed.         . diphenoxylate-atropine (LOMOTIL) 2.5-0.025 MG per tablet               . donepezil (ARICEPT) 10 MG tablet   Oral   Take 5 mg by mouth daily.          . enalapril (VASOTEC) 10 MG tablet   Oral   Take 5 mg by mouth  daily.          . fluconazole (DIFLUCAN) 40 MG/ML suspension   Oral   Take 200 mg by mouth daily. For 21 days. Stared on 09-03-13         . omeprazole (PRILOSEC) 2 mg/mL SUSP   Oral   Take 20 mg by mouth daily.         Marland Kitchen PREMARIN 0.625 MG tablet               . Salicylic Acid (SALACYN) 6 % LOTN   Apply externally   Apply topically as directed.          BP 106/55  Pulse 68  Temp(Src) 98.1 F (36.7 C) (Oral)  Resp 19  SpO2 100% Physical Exam  Nursing note and vitals reviewed. Constitutional: She is oriented to person, place, and time. She appears well-developed and well-nourished. No distress.  HENT:  Head: Normocephalic and atraumatic.  Right Ear: External ear normal.  Left Ear: External ear normal.  Mouth/Throat: Oropharynx is clear and moist.  Eyes: Conjunctivae are normal. Right eye exhibits no discharge. Left eye exhibits no discharge. No scleral icterus.  Neck: Neck supple. No tracheal deviation present.  Cardiovascular: Normal rate, regular rhythm and intact distal pulses.   Pulmonary/Chest: Effort normal and breath sounds normal. No stridor. No respiratory distress. She has no wheezes. She has no rales.  Abdominal: Soft. Bowel sounds are normal. She exhibits no distension. There is no tenderness. There is no rebound and no guarding.  Musculoskeletal: She exhibits tenderness (over laceration left forearm). She exhibits no edema.       Cervical back: Normal.       Thoracic back: She exhibits no bony tenderness (kyphotic).  Skin tear left forearm  Neurological: She is alert and oriented to person, place, and time. She has normal strength. No cranial nerve deficit (No facial droop, e3rd nerve palsy left eye (old per family), tongue midline  ) or sensory deficit. She exhibits normal muscle tone. She displays no seizure activity. Coordination normal.  No pronator drift bilateral upper extrem, able to hold both legs off bed for 5 seconds, sensation intact in all  extremities,  no left or right sided neglect, normal finger-nose exam bilaterally although somewhat limited by her shoulder immobility bilaterally, no nystagmus noted  Skin: Skin is warm and dry. No rash noted.  Psychiatric: She has a normal mood and affect.    ED Course  LACERATION REPAIR Date/Time: 09/16/2013 11:41 AM Performed by: Linwood Dibbles R Authorized by: Linwood Dibbles R Consent: Verbal consent obtained. Body area: upper extremity Location details: left lower arm Laceration length: 8 cm Tendon involvement: none Nerve involvement: none Vascular damage: no Irrigation solution: wound cleanser. Irrigation method: jet lavage Debridement: none Degree of undermining: none (skin edges, reapproximated, ) Skin closure: glue Approximation difficulty: simple Patient tolerance: Patient tolerated the procedure well with no immediate complications.   (including critical care time) Labs Review Labs Reviewed  COMPREHENSIVE METABOLIC PANEL - Abnormal; Notable for the following:    Potassium 2.8 (*)    Glucose, Bld 108 (*)    Creatinine, Ser 1.19 (*)    GFR calc non Af Amer 40 (*)    GFR calc Af Amer 47 (*)    All other components within normal limits  CBC WITH DIFFERENTIAL - Abnormal; Notable for the following:    WBC 11.4 (*)    MCHC 36.1 (*)    Neutrophils Relative % 84 (*)    Neutro Abs 9.6 (*)    Lymphocytes Relative 10 (*)    All other components within normal limits  CG4 I-STAT (LACTIC ACID) - Abnormal; Notable for the following:    Lactic Acid, Venous 2.45 (*)    All other components within normal limits  LIPASE, BLOOD  URINALYSIS, ROUTINE W REFLEX MICROSCOPIC  POCT I-STAT TROPONIN I   Imaging Review Ct Abdomen Pelvis W Contrast  09/16/2013   CLINICAL DATA:  Fall striking left side, periumbilical and right lower quadrant pain, nausea, vomiting, diarrhea, history irritable bowel syndrome  EXAM: CT ABDOMEN AND PELVIS WITH CONTRAST  TECHNIQUE: Multidetector CT imaging of  the abdomen and pelvis was performed using the standard protocol following bolus administration of intravenous contrast. Sagittal and coronal MPR images reconstructed from axial data set.  CONTRAST:  80mL OMNIPAQUE IOHEXOL 300 MG/ML SOLN. No oral contrast administered.  COMPARISON:  None  FINDINGS: Dependent atelectasis at both lung bases.  Scattered streak artifacts at upper in mid abdomen from the patient's arms.  Scattered atherosclerotic calcifications without lytic aortic aneurysm.  Calcified subcarinal and left hilar adenopathy.  Mild fatty infiltration of liver.  Distended gallbladder without gross wall thickening or surrounding edema.  Slight thickening of the left adrenal gland without discrete mass  Liver, spleen, pancreas, kidneys, and adrenal glands otherwise normal appearance.  Normal appendix.  Stomach and bowel loops unremarkable.  Uterus surgically absent with prominent enhancing vascular structures at left ovary/adnexa; no definite discrete ovarian mass identified.  Unremarkable bladder and ureters.  No mass, adenopathy, free fluid, or inflammatory process.  Bones appear demineralized without definite fracture.  IMPRESSION: No acute intra-abdominal or intrapelvic abnormalities.  Mild fatty infiltration of liver.  Old granulomatous disease within chest.   Electronically Signed   By: Ulyses Southward M.D.   On: 09/16/2013 14:27   Dg Abd Acute W/chest  09/16/2013   CLINICAL DATA:  Weakness, abdominal pain, fall  EXAM: ACUTE ABDOMEN SERIES (ABDOMEN 2 VIEW & CHEST 1 VIEW)  COMPARISON:  06/2012  FINDINGS: Mild cardiac enlargement. Vascular pattern normal. Lungs clear. No free air. No abnormally dilated loops of bowel. No abnormal air-fluid levels. Calcific densities in the pelvis likely represent phleboliths. Vascular calcifications left upper quadrant likely involve splenic artery. Moderate sigmoid scoliotic curvature of the thoracolumbar spine.  IMPRESSION: No acute abnormalities.  Nonobstructive bowel  gas pattern.   Electronically Signed   By: Esperanza Heir M.D.   On: 09/16/2013 12:44    EKG Interpretation    Date/Time:  Thursday September 16 2013 10:14:53 EST Ventricular Rate:  61 PR Interval:  171 QRS Duration: 102 QT Interval:  496 QTC Calculation: 500 R Axis:   38 Text Interpretation:  Sinus rhythm Low voltage, precordial leads Borderline prolonged QT interval , increased since last tracing Confirmed by Chimere Klingensmith  MD-J, Tequita Marrs ([id number]) on 09/16/2013 10:23:55 AM          CRITICAL CARE Performed by: Linwood Dibbles R Total critical care time: 40 Critical care time was exclusive of separately billable procedures and treating other patients. Critical care was necessary to treat or prevent imminent or life-threatening deterioration. Critical care was time spent personally by me on the following activities: development of treatment plan with patient and/or surrogate as well as nursing, discussions with consultants, evaluation of patient's response to treatment, examination of patient, obtaining history from patient or surrogate, ordering and performing treatments and interventions, ordering and review of laboratory studies, ordering and review of radiographic studies, pulse oximetry and re-evaluation of patient's condition.  1432  Pt's BP has improved with IV fluid hydration.  Elevated lactic acid levels however no focal source of infection at this time.  MDM   1. Syncope   2. Hypotension   3. Dehydration   4. Lactic acid blood increased    Patient has been having issues with vomiting and diarrhea. Urine emergency department she was initially hypotensive but has responded well to IV fluid hydration. Patient is hypokalemic and she was given oral potassium supplementation. CT scan does not show any evidence of acute abdominal process. Urinalysis is pending. No focal bacterial infection found. The patient will  be admitted to hospital for further treatment and evaluation.    Celene Kras,  MD 09/16/13 1433  Celene Kras, MD 09/16/13 [id number]

## 2013-09-16 NOTE — Progress Notes (Signed)
09/16/13 Nursing  Upon admission from ED, patient with Stage 2 on buttocks/sacrum, on left and right bottom cheek, with reddened areas around broken skin. Mepilex / Allevyn foam dressing applied per protocol. B heels also soft, elevated on pillows. Will continue to monitor patient. Marithza Malachi, [employer]

## 2013-09-17 DIAGNOSIS — R55 Syncope and collapse: Secondary | ICD-10-CM | POA: Diagnosis not present

## 2013-09-17 LAB — BASIC METABOLIC PANEL
BUN: 11 mg/dL (ref 6–23)
Chloride: 105 mEq/L (ref 96–112)
GFR calc Af Amer: 46 mL/min — ABNORMAL LOW (ref >90)
GFR calc non Af Amer: 40 mL/min — ABNORMAL LOW (ref >90)
Potassium: 3.7 mEq/L (ref 3.5–5.1)
Sodium: 140 mEq/L (ref 135–145)

## 2013-09-17 LAB — URINE CULTURE
Colony Count: NO GROWTH
Culture: NO GROWTH
Special Requests: NORMAL

## 2013-09-17 LAB — FOLATE RBC: RBC Folate: 888 ng/mL — ABNORMAL HIGH (ref >280)

## 2013-09-17 MED ORDER — LOPERAMIDE HCL 2 MG PO CAPS: 2.0000 mg | ORAL_CAPSULE | ORAL | Status: DC | PRN

## 2013-09-17 NOTE — Consult Note (Addendum)
WOC wound consult note Reason for Consult: Consult requested for 2 buttocks wounds.  Pt appears to be well-informed regarding plan of care for these pressure ulcers.  Family members at bedside also available to answer questions.  Pt reports she had pressure ulcer to buttocks which previously healed and then re-occurred.  Pt is constantly incontinent and it is difficult to keep area from soiling.  Home health has been using [facility] and wound has greatly improved, according to pt and daughter.   Wound type: 2 areas of stage 2 wounds to left inner buttock. Pressure Ulcer POA: Yes Measurement: .1X.1X.1cm and 2X.1X.1cm Wound NWG:NFAOZH and moist Drainage (amount, consistency, odor) small amt yellow drainage Periwound: raised edges to wound; appearance consistent with frequent shear Dressing procedure/placement/frequency: Continue present plan of care with [facility].  This topical treatment is not available in the [facility] health formulary but family members brought some from home; it can be applied Q day and covered with foam dressing to attempt to protect wound from frequent incontinence. Pt and family would like to continue follow-up with home health after discharge, please order if desired. Please re-consult if further assistance is needed.  Thank-you,  Cammie Mcgee MSN, RN, CWOCN, [place], CNS [phone]

## 2013-09-17 NOTE — Progress Notes (Addendum)
INITIAL NUTRITION ASSESSMENT  DOCUMENTATION CODES Per approved criteria  -Not Applicable   INTERVENTION: No nutrition intervention at this time --- patient declined RD to follow for nutrition care plan  NUTRITION DIAGNOSIS: Increased nutrient needs related to wound healing as evidenced by estimated nutrition needs  Goal: Pt to meet >/= 90% of their estimated nutrition needs   Monitor:  PO & supplemental intake, weight, labs, I/O's  Reason for Assessment: Malnutrition Screening Tool Report  79 y.o. female  Admitting Dx: Ambulatory dysfunction  ASSESSMENT: Patient with PMH of IBS, dysphagia with esophageal spasms and polyneuropathy; presented to ED after falling at home today; has been having increased diarrhea and vomiting due to her esophageal strictures.  RD spoke with patient at bedside; eating lunch upon RD visit; reports she usually consumes 4 small meals per day; consumes a Dys 1, nectar-thick liquid diet at home (uses her own blender at home); per weight readings, patient has had a 10% weight loss since June 2014 (severe for time frame); declined addition of supplements.  CWOCN note reviewed -- patient with 2 areas of stage II pressure ulcer to L inner buttock.  Height: Ht Readings from Last 1 Encounters:  09/17/13 5\' 1"  (1.549 m)    Weight: Wt Readings from Last 1 Encounters:  09/17/13 145 lb 8.1 oz (66 kg)    Ideal Body Weight: 105 lb  % Ideal Body Weight: 138%  Wt Readings from Last 10 Encounters:  09/17/13 145 lb 8.1 oz (66 kg)  08/24/13 151 lb 3.2 oz (68.584 kg)  07/20/13 156 lb (70.761 kg)  04/23/13 161 lb (73.029 kg)    Usual Body Weight: 161 lb  % Usual Body Weight: 90%  BMI:  Body mass index is 27.51 kg/(m^2).  Estimated Nutritional Needs: Kcal: 1450-1650 Protein: 80-90 gm Fluid: >/= 1.5 L  Skin: 2 areas of Stage II wounds to L inner buttock  Diet Order: Dysphagia 1, nectar-thick liquids  EDUCATION NEEDS: -No education needs  identified at this time   Intake/Output Summary (Last 24 hours) at 09/17/13 1110 Last data filed at 09/17/13 0700  Gross per 24 hour  Intake   1020 ml  Output      0 ml  Net   1020 ml    Labs:   Recent Labs Lab 09/16/13 1051 09/16/13 1730 09/17/13 0833  NA 137  --  140  K 2.8*  --  3.7  CL 96  --  105  CO2 27  --  25  BUN 12  --  11  CREATININE 1.19* 1.15* 1.20*  CALCIUM 9.4  --  8.4  MG  --  1.9  --   PHOS  --  2.8  --   GLUCOSE 108*  --  106*    Scheduled Meds: . carbidopa-levodopa  1 tablet Oral QHS  . donepezil  5 mg Oral Daily  . fluconazole  200 mg Oral Daily  . heparin subcutaneous  5,000 Units Subcutaneous Q8H  . omeprazole  20 mg Oral Daily  . ondansetron  4 mg Intravenous Once  . sodium chloride  3 mL Intravenous Q12H    Continuous Infusions: . 0.9 % NaCl with KCl 40 mEq / L 75 mL/hr at 09/17/13 1610    Past Medical History  Diagnosis Date  . Weight loss   . Vomiting   . Dysphagia, unspecified(787.20)   . Memory loss   . Hypotension, unspecified   . Irritable bowel syndrome   . Restless legs syndrome (RLS)   .  Carpal tunnel syndrome   . Abnormality of gait   . Headache   . Pain in limb   . Polyneuropathy in other diseases classified elsewhere     Past Surgical History  Procedure Laterality Date  . Bladder aspiration    . Carpal tunnel release Right   . Squamous cell carcinoma excision      Stacey Jefferson, RD, LDN Pager #: [phone] After-Hours Pager #: [phone]

## 2013-09-17 NOTE — Evaluation (Signed)
Physical Therapy Evaluation Patient Details Name: Stacey Jefferson MRN: [id number] DOB: October 12, 1925 Today's Date: 09/17/2013 Time: 4098-1191 PT Time Calculation (min): 30 min  PT Assessment / Plan / Recommendation History of Present Illness  Patient is an 79 yo female with past medical history of IBS, dysphagia with esophageal spasms, polyneuropathy, that presents to the emergency department after falling at home today. Patient states that this morning she felt the need to have diarrhea, as she walked to the bathroom with a walker she felt weak and fell.   Clinical Impression  Patient presents with problems listed below.  Will benefit from acute PT to maximize independence prior to discharge home with family.    PT Assessment  Patient needs continued PT services    Follow Up Recommendations  Home health PT;Supervision/Assistance - 24 hour    Does the patient have the potential to tolerate intense rehabilitation      Barriers to Discharge Decreased caregiver support Patient lives alone.  Daughter to provide 24 hour assist at discharge.    Equipment Recommendations  None recommended by PT    Recommendations for Other Services     Frequency Min 3X/week    Precautions / Restrictions Precautions Precautions: Fall Restrictions Weight Bearing Restrictions: No   Pertinent Vitals/Pain       Mobility  Bed Mobility Bed Mobility: Not assessed Transfers Transfers: Sit to Stand;Stand to Sit Sit to Stand: 4: Min assist;With upper extremity assist;From bed;With armrests;From chair/3-in-1 Stand to Sit: 4: Min guard;With upper extremity assist;With armrests;To chair/3-in-1 Details for Transfer Assistance: Verbal cues for hand placement.  Patient demonstrates safe use of rollator for sit <> stand. Ambulation/Gait Ambulation/Gait Assistance: 4: Min assist Ambulation Distance (Feet): 64 Feet Assistive device: 4-wheeled walker Ambulation/Gait Assistance Details: Verbal cues to stand upright  and look up during gait.  Safe use of rollator demonstrated.  Patient required 1 sitting rest break Gait Pattern: Step-through pattern;Decreased stride length;Trunk flexed    Exercises     PT Diagnosis: Difficulty walking;Abnormality of gait;Generalized weakness  PT Problem List: Decreased strength;Decreased activity tolerance;Decreased balance;Decreased mobility;Cardiopulmonary status limiting activity PT Treatment Interventions: DME instruction;Gait training;Functional mobility training;Patient/family education     PT Goals(Current goals can be found in the care plan section) Acute Rehab PT Goals Patient Stated Goal: To get stronger to go home PT Goal Formulation: With patient/family Time For Goal Achievement: 09/24/13 Potential to Achieve Goals: Good  Visit Information  Last PT Received On: 09/17/13 Assistance Needed: +1 History of Present Illness: Patient is an 79 yo female with past medical history of IBS, dysphagia with esophageal spasms, polyneuropathy, that presents to the emergency department after falling at home today. Patient states that this morning she felt the need to have diarrhea, as she walked to the bathroom with a walker she felt weak and fell.        Prior Functioning  Home Living Family/patient expects to be discharged to:: Private residence Living Arrangements: Alone Available Help at Discharge: Family;Available 24 hours/day (Daughter staying with patient) Type of Home: House Home Access: Level entry Home Layout: One level Home Equipment: Walker - 4 wheels;Shower seat Prior Function Level of Independence: Independent with assistive device(s) Communication Communication: HOH    Cognition  Cognition Arousal/Alertness: Awake/alert Behavior During Therapy: WFL for tasks assessed/performed Overall Cognitive Status: Within Functional Limits for tasks assessed    Extremity/Trunk Assessment Upper Extremity Assessment Upper Extremity Assessment: Generalized  weakness;RUE deficits/detail;LUE deficits/detail RUE Deficits / Details: Decreased shoulder ROM LUE Deficits / Details: Decreased shoulder ROM  Lower Extremity Assessment Lower Extremity Assessment: Generalized weakness (Neuropathy)   Balance Balance Balance Assessed: Yes Static Standing Balance Static Standing - Balance Support: Bilateral upper extremity supported Static Standing - Level of Assistance: 4: Min assist Static Standing - Comment/# of Minutes: 3 minutes for pericare and donning mesh undergarments with pads due to incontinence  End of Session PT - End of Session Equipment Utilized During Treatment: Gait belt Activity Tolerance: Patient limited by fatigue Patient left: in chair;with call bell/phone within reach;with family/visitor present Nurse Communication: Mobility status  GP Functional Assessment Tool Used: Clinical judgement Functional Limitation: Mobility: Walking and moving around Mobility: Walking and Moving Around Current Status [phone]): At least 20 percent but less than 40 percent impaired, limited or restricted Mobility: Walking and Moving Around Goal Status [phone]): At least 1 percent but less than 20 percent impaired, limited or restricted   Vena Austria 09/17/2013, 4:43 PM Durenda Hurt. Renaldo Fiddler, [facility] Acute Rehab Services Pager [phone]

## 2013-09-17 NOTE — Progress Notes (Addendum)
[facility]                                                                                Patient Demographics  Stacey Jefferson, is a 79 y.o. female, DOB - [birth year], ZOX:[id number]  Admit date - 09/16/2013   Admitting Physician Stacey Petrin, DO  Outpatient Primary MD for the patient is Stacey Housekeeper, MD  LOS - 1   Chief Complaint  Patient presents with  . Weakness  . Abdominal Pain  . Fall        Assessment & Plan    Ambulatory dysfunction status post fall   This was secondary to dehydration due to her multiple episodes of diarrhea, diarrhea is improved, she is not orthostatic, she is improved with IV fluids, stool is C. difficile negative and will add when necessary Imodium. Her diarrhea was secondary to Cipro which she has completed 4 resolved acute bronchitis.     Dysphagia with esophageal stricture which is chronic and recurrent Patient is currently being followed by Stacey Jefferson at [facility]. Continue dysphagia 1 diet, she also has been on fluconazole for candida esophagitis which will be continued she completes the course, post discharge we'll continue to follow with [facility] for repeat EGD for his esophageal dilation      Acute kidney injury likely secondary to dehydration  Patient's creatinine then is usually less than 1. Current creatinine is 1.19. Hydration with IV fluids she is close to her baseline      Hypokalemia secondary to diarrhea  Replaced and stable.     Mildly Prolonged QTC of 500 ms  Stable magnesium, stable on telemetry monitor.     Stage II right buttock decubitus ulcer. Chronic. Wound care consulted the     D/W Dr Stacey Jefferson he will take over care 09-17-13       Code Status: DO NOT RESUSCITATE  Family Communication: Her daughters that side  Disposition Plan: Home   Procedures    Consults wound care   Medications  Scheduled Meds: . acetaminophen  650 mg Oral BID  . carbidopa-levodopa  1 tablet Oral QHS   . donepezil  5 mg Oral Daily  . fluconazole  200 mg Oral Daily  . heparin subcutaneous  5,000 Units Subcutaneous Q8H  . omeprazole  20 mg Oral Daily  . ondansetron  4 mg Intravenous Once  . sodium chloride  3 mL Intravenous Q12H   Continuous Infusions: . 0.9 % NaCl with KCl 40 mEq / L 75 mL/hr at 09/17/13 0713   PRN Meds:.acetaminophen, acetaminophen, clonazePAM, diphenoxylate-atropine, morphine injection  DVT Prophylaxis  Heparin   Lab Results  Component Value Date   PLT 156 09/16/2013    Antibiotics     Anti-infectives   Start     Dose/Rate Route Frequency Ordered Stop   09/16/13 2200  ciprofloxacin (CIPRO) 500 MG/5ML (10%) suspension 500 mg  Status:  Discontinued     500 mg Oral 2 times daily 09/16/13 1609 09/17/13 0933   09/16/13 1615  fluconazole (DIFLUCAN) 40 MG/ML suspension 200 mg     200 mg Oral Daily 09/16/13 1609            Subjective:  Stacey Jefferson today has, No headache, No chest pain, No abdominal pain - No Nausea, No new weakness tingling or numbness, No Cough - SOB.    Objective:   Filed Vitals:   09/16/13 2141 09/17/13 0535 09/17/13 0544 09/17/13 0829  BP: 108/59 108/65  93/62  Pulse: 68 62  66  Temp: 98.5 F (36.9 C) 97.9 F (36.6 C)    TempSrc: Oral Oral    Resp: 18 18  18   Height:   5\' 1"  (1.549 m)   Weight:   66 kg (145 lb 8.1 oz)   SpO2: 96% 96%  96%    Wt Readings from Last 3 Encounters:  09/17/13 66 kg (145 lb 8.1 oz)  08/24/13 68.584 kg (151 lb 3.2 oz)  07/20/13 70.761 kg (156 lb)     Intake/Output Summary (Last 24 hours) at 09/17/13 1059 Last data filed at 09/17/13 0700  Gross per 24 hour  Intake   1020 ml  Output      0 ml  Net   1020 ml    Exam Awake Alert, Oriented X 3, No new F.N deficits, Normal affect [place].AT,PERRAL Supple Neck,No JVD, No cervical lymphadenopathy appriciated.  Symmetrical Chest wall movement, Good air movement bilaterally, CTAB RRR,No Gallops,Rubs or new Murmurs, No Parasternal Heave +ve B.Sounds,  Abd Soft, Non tender, No organomegaly appriciated, No rebound - guarding or rigidity. No Cyanosis, Clubbing or edema, No new Rash or bruise     Data Review   Micro Results Recent Results (from the past 240 hour(s))  CLOSTRIDIUM DIFFICILE BY PCR     Status: None   Collection Time    09/16/13  2:44 PM      Result Value Range Status   C difficile by pcr NEGATIVE  NEGATIVE Final  CULTURE, BLOOD (ROUTINE X 2)     Status: None   Collection Time    09/16/13  3:08 PM      Result Value Range Status   Specimen Description BLOOD ARM RIGHT   Final   Special Requests BOTTLES DRAWN AEROBIC AND ANAEROBIC 10CC   Final   Culture  Setup Time     Final   Value: 09/16/2013 20:41     Performed at [facility]   Culture     Final   Value:        BLOOD CULTURE RECEIVED NO GROWTH TO DATE CULTURE WILL BE HELD FOR 5 DAYS BEFORE ISSUING A FINAL NEGATIVE REPORT     Performed at [facility]   Report Status PENDING   Incomplete  CULTURE, BLOOD (ROUTINE X 2)     Status: None   Collection Time    09/16/13  3:20 PM      Result Value Range Status   Specimen Description BLOOD HAND RIGHT   Final   Special Requests BOTTLES DRAWN AEROBIC ONLY 10CC   Final   Culture  Setup Time     Final   Value: 09/16/2013 20:39     Performed at [facility]   Culture     Final   Value:        BLOOD CULTURE RECEIVED NO GROWTH TO DATE CULTURE WILL BE HELD FOR 5 DAYS BEFORE ISSUING A FINAL NEGATIVE REPORT     Performed at [facility]   Report Status PENDING   Incomplete    Radiology Reports Ct Abdomen Pelvis W Contrast  09/16/2013   CLINICAL DATA:  Fall striking left side, periumbilical and right lower quadrant  pain, nausea, vomiting, diarrhea, history irritable bowel syndrome  EXAM: CT ABDOMEN AND PELVIS WITH CONTRAST  TECHNIQUE: Multidetector CT imaging of the abdomen and pelvis was performed using the standard protocol following bolus administration of intravenous contrast. Sagittal and  coronal MPR images reconstructed from axial data set.  CONTRAST:  80mL OMNIPAQUE IOHEXOL 300 MG/ML SOLN. No oral contrast administered.  COMPARISON:  None  FINDINGS: Dependent atelectasis at both lung bases.  Scattered streak artifacts at upper in mid abdomen from the patient's arms.  Scattered atherosclerotic calcifications without lytic aortic aneurysm.  Calcified subcarinal and left hilar adenopathy.  Mild fatty infiltration of liver.  Distended gallbladder without gross wall thickening or surrounding edema.  Slight thickening of the left adrenal gland without discrete mass  Liver, spleen, pancreas, kidneys, and adrenal glands otherwise normal appearance.  Normal appendix.  Stomach and bowel loops unremarkable.  Uterus surgically absent with prominent enhancing vascular structures at left ovary/adnexa; no definite discrete ovarian mass identified.  Unremarkable bladder and ureters.  No mass, adenopathy, free fluid, or inflammatory process.  Bones appear demineralized without definite fracture.  IMPRESSION: No acute intra-abdominal or intrapelvic abnormalities.  Mild fatty infiltration of liver.  Old granulomatous disease within chest.   Electronically Signed   By: Ulyses Southward M.D.   On: 09/16/2013 14:27   Dg Abd Acute W/chest  09/16/2013   CLINICAL DATA:  Weakness, abdominal pain, fall  EXAM: ACUTE ABDOMEN SERIES (ABDOMEN 2 VIEW & CHEST 1 VIEW)  COMPARISON:  06/2012  FINDINGS: Mild cardiac enlargement. Vascular pattern normal. Lungs clear. No free air. No abnormally dilated loops of bowel. No abnormal air-fluid levels. Calcific densities in the pelvis likely represent phleboliths. Vascular calcifications left upper quadrant likely involve splenic artery. Moderate sigmoid scoliotic curvature of the thoracolumbar spine.  IMPRESSION: No acute abnormalities.  Nonobstructive bowel gas pattern.   Electronically Signed   By: Esperanza Heir M.D.   On: 09/16/2013 12:44    CBC  Recent Labs Lab 09/16/13 1051  09/16/13 1730  WBC 11.4* 8.5  HGB 13.6 12.8  HCT 37.7 35.9*  PLT 178 156  MCV 92.9 93.7  MCH 33.5 33.4  MCHC 36.1* 35.7  RDW 12.2 12.4  LYMPHSABS 1.2  --   MONOABS 0.6  --   EOSABS 0.0  --   BASOSABS 0.0  --     Chemistries   Recent Labs Lab 09/16/13 1051 09/16/13 1730 09/17/13 0833  NA 137  --  140  K 2.8*  --  3.7  CL 96  --  105  CO2 27  --  25  GLUCOSE 108*  --  106*  BUN 12  --  11  CREATININE 1.19* 1.15* 1.20*  CALCIUM 9.4  --  8.4  MG  --  1.9  --   AST 23  --   --   ALT 10  --   --   ALKPHOS 60  --   --   BILITOT 0.9  --   --    ------------------------------------------------------------------------------------------------------------------ estimated creatinine clearance is 29.8 ml/min (by C-G formula based on Cr of 1.2). ------------------------------------------------------------------------------------------------------------------ No results found for this basename: HGBA1C,  in the last 72 hours ------------------------------------------------------------------------------------------------------------------ No results found for this basename: CHOL, HDL, LDLCALC, TRIG, CHOLHDL, LDLDIRECT,  in the last 72 hours ------------------------------------------------------------------------------------------------------------------  Recent Labs  09/16/13 1730  TSH 1.755   ------------------------------------------------------------------------------------------------------------------  Recent Labs  09/16/13 1730  VITAMINB12 >2000*    Coagulation profile No results found for this basename: INR, PROTIME,  in the last 168 hours  No results found for this basename: DDIMER,  in the last 72 hours  Cardiac Enzymes No results found for this basename: CK, CKMB, TROPONINI, MYOGLOBIN,  in the last 168 hours ------------------------------------------------------------------------------------------------------------------ No components found with this  basename: POCBNP,      Time Spent in minutes  35   Susa Raring K M.D on 09/17/2013 at 10:59 AM  Between 7am to 7pm - Pager - [phone]  After 7pm go to [email or web address] - password [email or web address]  And look for the night coverage person covering for me after hours  [facility] Office  [phone]

## 2013-09-17 NOTE — Care Management Note (Signed)
    Page 1 of 1   09/17/2013     12:34:53 PM   CARE MANAGEMENT NOTE 09/17/2013  Patient:  Stacey Jefferson, Stacey Jefferson   Account Number:  [id number]  Date Initiated:  09/17/2013  Documentation initiated by:  Avie Arenas  Subjective/Objective Assessment:   Admitted with syncope.  Lives at home alone.     Action/Plan:   Anticipated DC Date:  09/17/2013   Anticipated DC Plan:  HOME W HOME HEALTH SERVICES      DC Planning Services  CM consult      Choice offered to / List presented to:          [facility] arranged  HH-1 RN  HH-2 PT  HH-4 NURSE'S AIDE  HH-6 SOCIAL WORKER      Status of service:  Completed, signed off Medicare Important Message given?   (If response is "NO", the following Medicare IM given date fields will be blank) Date Medicare IM given:   Date Additional Medicare IM given:    Discharge Disposition:    Per UR Regulation:  Reviewed for med. necessity/level of care/duration of stay  If discussed at Long Length of Stay Meetings, dates discussed:    Comments:  09-17-13  12:25pm Avie Arenas, RNBSN [phone] Lives at home alone.  Daughter, Harriett Sine states is now staying with her and will be with her for the next couple of days. Have had [facility] in past and would like to continue.   has RW at home - no other equipment identified.  Referral made to [facility].

## 2013-09-18 DIAGNOSIS — E860 Dehydration: Secondary | ICD-10-CM | POA: Diagnosis not present

## 2013-09-18 DIAGNOSIS — R197 Diarrhea, unspecified: Secondary | ICD-10-CM | POA: Diagnosis not present

## 2013-09-18 LAB — BASIC METABOLIC PANEL
CO2: 25 mEq/L (ref 19–32)
Chloride: 110 mEq/L (ref 96–112)
Creatinine, Ser: 1.03 mg/dL (ref 0.50–1.10)
GFR calc Af Amer: 56 mL/min — ABNORMAL LOW (ref >90)
Potassium: 4.1 mEq/L (ref 3.5–5.1)
Sodium: 141 mEq/L (ref 135–145)

## 2013-09-18 MED ORDER — FLUCONAZOLE 40 MG/ML PO SUSR: 200.0000 mg | Freq: Every day | ORAL | Status: DC

## 2013-09-18 NOTE — Progress Notes (Addendum)
09/18/2013 1300 Nursing note Discharge avs form, medications already taken today and those due this evening given and explained to patient and daughter. Follow up appointments, home health arrangements and when to call MD reviewed. Discontinued medications also reviewed. D/c iv line and tele by Lonia Blood RN. Questions and concerns addressed. D/c home with daughter per orders.  Zriyah Kopplin, Blanchard Kelch

## 2013-09-18 NOTE — Progress Notes (Signed)
09/18/2013 1030 Nursing note Pt. And daughter voicing concerns about d/c Diflucan suspension due to pt. Recent dx Candida Esophagitis per pt. Daughter and that pt. Has a few more days left on her course of this medication. Dr. Pete Glatter paged and made aware of pt. Concerns. Verbal orders received ok to administer today's dose of medication. MD to update discharge AVS to reflect recommendations of MD. Pt. And daughter updated on plan. Will continue to monitor.  Stacey Jefferson, Blanchard Kelch

## 2013-09-18 NOTE — Progress Notes (Signed)
   CARE MANAGEMENT NOTE 09/18/2013  Patient:  Stacey Jefferson, Stacey Jefferson   Account Number:  [id number]  Date Initiated:  09/17/2013  Documentation initiated by:  [facility]  Subjective/Objective Assessment:   Admitted with syncope.  Lives at home alone.     Action/Plan:   Anticipated DC Date:  09/17/2013   Anticipated DC Plan:  HOME W HOME HEALTH SERVICES      DC Planning Services  CM consult      Choice offered to / List presented to:          [facility] arranged  HH-1 RN  HH-2 PT  HH-4 NURSE'S AIDE  HH-6 SOCIAL WORKER      Status of service:  Completed, signed off Medicare Important Message given?   (If response is "NO", the following Medicare IM given date fields will be blank) Date Medicare IM given:   Date Additional Medicare IM given:    Discharge Disposition:  HOME W HOME HEALTH SERVICES  Per UR Regulation:  Reviewed for med. necessity/level of care/duration of stay  If discussed at Long Length of Stay Meetings, dates discussed:    Comments:  09/18/13 10:10 Cm notified [facility] of discharge and no other CM needs were communicated.  Freddy Jaksch, BSN, [place] [phone].  11-17-12  12:25pm Avie Arenas, RNBSN [phone] Lives at home alone.  Daughter, Harriett Sine states is now staying with her and will be with her for the next couple of days. Have had [facility] in past and would like to continue.   has RW at home - no other equipment identified.  Referral made to [facility].

## 2013-09-18 NOTE — Discharge Summary (Signed)
Physician Discharge Summary   Patient ID: Stacey Jefferson [id number] 79 y.o. January 18, 1925  Admit date: 09/16/2013  Discharge date and time: No discharge date for patient encounter.   Admitting Physician: Edsel Petrin, DO   Discharge Physician: Merlene Laughter  Admission Diagnoses: Irritable bowel syndrome [564.1] Abnormality of gait [781.2] Dehydration [276.51] Hypokalemia [276.8] Syncope [780.2] Lactic acid blood increased [276.2] Dysphagia, unspecified(787.20) [787.20] Hypotension [458.9] Ambulatory dysfunction [719.7]  Discharge Diagnoses: diarrhea, dehydration  Admission Condition: good  Discharged Condition: good  Indication for Admission: dehydration  Hospital Course: The patient was admitted after having problems with diarrhea and progressive weakness. She did well with IV hydration. Her C. difficile test was negative. Her diarrhea gradually resolved after stopping the ciprofloxacin. She was seen by physical therapy and was noted to have an unsteady gait home health physical therapy will be set up  Consults: None  Significant Diagnostic Studies: labs: C. difficile negative  Treatments: IV hydration  Discharge Exam: Lungs clear heart regular rate and rhythm without murmur abdomen soft nontender  Disposition: 01-Home or Self Care  Patient Instructions:    Medication List    STOP taking these medications       bisoprolol-hydrochlorothiazide 5-6.25 MG per tablet  Commonly known as:  ZIAC     ciprofloxacin 500 MG/5ML (10%) suspension  Commonly known as:  CIPRO     diphenoxylate-atropine 2.5-0.025 MG per tablet  Commonly known as:  LOMOTIL     enalapril 10 MG tablet  Commonly known as:  VASOTEC     fluconazole 40 MG/ML suspension  Commonly known as:  DIFLUCAN      TAKE these medications       carbidopa-levodopa 25-100 MG per tablet  Commonly known as:  SINEMET  Take 1 tablet by mouth at bedtime.     clonazePAM 0.5 MG tablet  Commonly known as:   KLONOPIN  Take 1 tablet (0.5 mg total) by mouth at bedtime as needed.     donepezil 10 MG tablet  Commonly known as:  ARICEPT  Take 5 mg by mouth daily.     omeprazole 2 mg/mL Susp  Commonly known as:  PRILOSEC  Take 20 mg by mouth daily.     PREMARIN 0.625 MG tablet  Generic drug:  estrogens (conjugated)     SALACYN 6 % Lotn  Generic drug:  Salicylic Acid  Apply topically as directed.     TYLENOL 8 HOUR 650 MG CR tablet  Generic drug:  acetaminophen  Take 650 mg by mouth 2 (two) times daily.     VITAMIN B 12 PO  Take by mouth as directed.       Activity: activity as tolerated Diet: regular diet Wound Care: none needed  Follow-up with Dr Donette Larry in 2 weeks.  SignedGinette Otto 09/18/2013 9:22 AM

## 2013-09-19 DIAGNOSIS — K589 Irritable bowel syndrome without diarrhea: Secondary | ICD-10-CM | POA: Diagnosis not present

## 2013-09-19 DIAGNOSIS — R269 Unspecified abnormalities of gait and mobility: Secondary | ICD-10-CM | POA: Diagnosis not present

## 2013-09-19 DIAGNOSIS — L89309 Pressure ulcer of unspecified buttock, unspecified stage: Secondary | ICD-10-CM | POA: Diagnosis not present

## 2013-09-19 DIAGNOSIS — L8992 Pressure ulcer of unspecified site, stage 2: Secondary | ICD-10-CM | POA: Diagnosis not present

## 2013-09-19 DIAGNOSIS — R5381 Other malaise: Secondary | ICD-10-CM | POA: Diagnosis not present

## 2013-09-20 DIAGNOSIS — R5381 Other malaise: Secondary | ICD-10-CM | POA: Diagnosis not present

## 2013-09-20 DIAGNOSIS — L89309 Pressure ulcer of unspecified buttock, unspecified stage: Secondary | ICD-10-CM | POA: Diagnosis not present

## 2013-09-20 DIAGNOSIS — L8992 Pressure ulcer of unspecified site, stage 2: Secondary | ICD-10-CM | POA: Diagnosis not present

## 2013-09-20 DIAGNOSIS — K589 Irritable bowel syndrome without diarrhea: Secondary | ICD-10-CM | POA: Diagnosis not present

## 2013-09-20 DIAGNOSIS — R269 Unspecified abnormalities of gait and mobility: Secondary | ICD-10-CM | POA: Diagnosis not present

## 2013-09-21 DIAGNOSIS — M1710 Unilateral primary osteoarthritis, unspecified knee: Secondary | ICD-10-CM | POA: Diagnosis not present

## 2013-09-22 DIAGNOSIS — R269 Unspecified abnormalities of gait and mobility: Secondary | ICD-10-CM | POA: Diagnosis not present

## 2013-09-22 DIAGNOSIS — L8992 Pressure ulcer of unspecified site, stage 2: Secondary | ICD-10-CM | POA: Diagnosis not present

## 2013-09-22 DIAGNOSIS — L89309 Pressure ulcer of unspecified buttock, unspecified stage: Secondary | ICD-10-CM | POA: Diagnosis not present

## 2013-09-22 DIAGNOSIS — K589 Irritable bowel syndrome without diarrhea: Secondary | ICD-10-CM | POA: Diagnosis not present

## 2013-09-22 DIAGNOSIS — R5381 Other malaise: Secondary | ICD-10-CM | POA: Diagnosis not present

## 2013-09-22 LAB — CULTURE, BLOOD (ROUTINE X 2): Culture: NO GROWTH

## 2013-09-24 DIAGNOSIS — K589 Irritable bowel syndrome without diarrhea: Secondary | ICD-10-CM | POA: Diagnosis not present

## 2013-09-24 DIAGNOSIS — R269 Unspecified abnormalities of gait and mobility: Secondary | ICD-10-CM | POA: Diagnosis not present

## 2013-09-24 DIAGNOSIS — L8992 Pressure ulcer of unspecified site, stage 2: Secondary | ICD-10-CM | POA: Diagnosis not present

## 2013-09-24 DIAGNOSIS — L89309 Pressure ulcer of unspecified buttock, unspecified stage: Secondary | ICD-10-CM | POA: Diagnosis not present

## 2013-09-24 DIAGNOSIS — R5381 Other malaise: Secondary | ICD-10-CM | POA: Diagnosis not present

## 2013-09-27 DIAGNOSIS — B3781 Candidal esophagitis: Secondary | ICD-10-CM | POA: Diagnosis not present

## 2013-09-27 DIAGNOSIS — R634 Abnormal weight loss: Secondary | ICD-10-CM | POA: Diagnosis not present

## 2013-09-27 DIAGNOSIS — K200 Eosinophilic esophagitis: Secondary | ICD-10-CM | POA: Diagnosis not present

## 2013-09-28 DIAGNOSIS — K589 Irritable bowel syndrome without diarrhea: Secondary | ICD-10-CM | POA: Diagnosis not present

## 2013-09-28 DIAGNOSIS — L89309 Pressure ulcer of unspecified buttock, unspecified stage: Secondary | ICD-10-CM | POA: Diagnosis not present

## 2013-09-28 DIAGNOSIS — L8992 Pressure ulcer of unspecified site, stage 2: Secondary | ICD-10-CM | POA: Diagnosis not present

## 2013-09-28 DIAGNOSIS — R269 Unspecified abnormalities of gait and mobility: Secondary | ICD-10-CM | POA: Diagnosis not present

## 2013-09-28 DIAGNOSIS — R5381 Other malaise: Secondary | ICD-10-CM | POA: Diagnosis not present

## 2013-09-30 DIAGNOSIS — R5381 Other malaise: Secondary | ICD-10-CM | POA: Diagnosis not present

## 2013-09-30 DIAGNOSIS — L8992 Pressure ulcer of unspecified site, stage 2: Secondary | ICD-10-CM | POA: Diagnosis not present

## 2013-09-30 DIAGNOSIS — L89309 Pressure ulcer of unspecified buttock, unspecified stage: Secondary | ICD-10-CM | POA: Diagnosis not present

## 2013-09-30 DIAGNOSIS — R269 Unspecified abnormalities of gait and mobility: Secondary | ICD-10-CM | POA: Diagnosis not present

## 2013-09-30 DIAGNOSIS — K589 Irritable bowel syndrome without diarrhea: Secondary | ICD-10-CM | POA: Diagnosis not present

## 2013-10-05 DIAGNOSIS — R5381 Other malaise: Secondary | ICD-10-CM | POA: Diagnosis not present

## 2013-10-05 DIAGNOSIS — R269 Unspecified abnormalities of gait and mobility: Secondary | ICD-10-CM | POA: Diagnosis not present

## 2013-10-05 DIAGNOSIS — K589 Irritable bowel syndrome without diarrhea: Secondary | ICD-10-CM | POA: Diagnosis not present

## 2013-10-05 DIAGNOSIS — L8992 Pressure ulcer of unspecified site, stage 2: Secondary | ICD-10-CM | POA: Diagnosis not present

## 2013-10-05 DIAGNOSIS — L89309 Pressure ulcer of unspecified buttock, unspecified stage: Secondary | ICD-10-CM | POA: Diagnosis not present

## 2013-10-06 DIAGNOSIS — H4900 Third [oculomotor] nerve palsy, unspecified eye: Secondary | ICD-10-CM | POA: Diagnosis not present

## 2013-10-06 DIAGNOSIS — Z961 Presence of intraocular lens: Secondary | ICD-10-CM | POA: Diagnosis not present

## 2013-10-06 DIAGNOSIS — H02409 Unspecified ptosis of unspecified eyelid: Secondary | ICD-10-CM | POA: Diagnosis not present

## 2013-10-06 DIAGNOSIS — H00019 Hordeolum externum unspecified eye, unspecified eyelid: Secondary | ICD-10-CM | POA: Diagnosis not present

## 2013-10-06 DIAGNOSIS — H04129 Dry eye syndrome of unspecified lacrimal gland: Secondary | ICD-10-CM | POA: Diagnosis not present

## 2013-10-07 DIAGNOSIS — K589 Irritable bowel syndrome without diarrhea: Secondary | ICD-10-CM | POA: Diagnosis not present

## 2013-10-07 DIAGNOSIS — R269 Unspecified abnormalities of gait and mobility: Secondary | ICD-10-CM | POA: Diagnosis not present

## 2013-10-07 DIAGNOSIS — L89309 Pressure ulcer of unspecified buttock, unspecified stage: Secondary | ICD-10-CM | POA: Diagnosis not present

## 2013-10-07 DIAGNOSIS — L8992 Pressure ulcer of unspecified site, stage 2: Secondary | ICD-10-CM | POA: Diagnosis not present

## 2013-10-07 DIAGNOSIS — R5381 Other malaise: Secondary | ICD-10-CM | POA: Diagnosis not present

## 2013-10-08 DIAGNOSIS — L89309 Pressure ulcer of unspecified buttock, unspecified stage: Secondary | ICD-10-CM | POA: Diagnosis not present

## 2013-10-08 DIAGNOSIS — K589 Irritable bowel syndrome without diarrhea: Secondary | ICD-10-CM | POA: Diagnosis not present

## 2013-10-08 DIAGNOSIS — L8992 Pressure ulcer of unspecified site, stage 2: Secondary | ICD-10-CM | POA: Diagnosis not present

## 2013-10-08 DIAGNOSIS — R269 Unspecified abnormalities of gait and mobility: Secondary | ICD-10-CM | POA: Diagnosis not present

## 2013-10-08 DIAGNOSIS — R5381 Other malaise: Secondary | ICD-10-CM | POA: Diagnosis not present

## 2013-10-11 DIAGNOSIS — L8992 Pressure ulcer of unspecified site, stage 2: Secondary | ICD-10-CM | POA: Diagnosis not present

## 2013-10-11 DIAGNOSIS — L89309 Pressure ulcer of unspecified buttock, unspecified stage: Secondary | ICD-10-CM | POA: Diagnosis not present

## 2013-10-11 DIAGNOSIS — R5381 Other malaise: Secondary | ICD-10-CM | POA: Diagnosis not present

## 2013-10-11 DIAGNOSIS — K589 Irritable bowel syndrome without diarrhea: Secondary | ICD-10-CM | POA: Diagnosis not present

## 2013-10-11 DIAGNOSIS — R269 Unspecified abnormalities of gait and mobility: Secondary | ICD-10-CM | POA: Diagnosis not present

## 2013-10-13 DIAGNOSIS — L8992 Pressure ulcer of unspecified site, stage 2: Secondary | ICD-10-CM | POA: Diagnosis not present

## 2013-10-13 DIAGNOSIS — R5381 Other malaise: Secondary | ICD-10-CM | POA: Diagnosis not present

## 2013-10-13 DIAGNOSIS — K589 Irritable bowel syndrome without diarrhea: Secondary | ICD-10-CM | POA: Diagnosis not present

## 2013-10-13 DIAGNOSIS — R269 Unspecified abnormalities of gait and mobility: Secondary | ICD-10-CM | POA: Diagnosis not present

## 2013-10-13 DIAGNOSIS — L89309 Pressure ulcer of unspecified buttock, unspecified stage: Secondary | ICD-10-CM | POA: Diagnosis not present

## 2013-10-14 DIAGNOSIS — R5381 Other malaise: Secondary | ICD-10-CM | POA: Diagnosis not present

## 2013-10-14 DIAGNOSIS — L8992 Pressure ulcer of unspecified site, stage 2: Secondary | ICD-10-CM | POA: Diagnosis not present

## 2013-10-14 DIAGNOSIS — R269 Unspecified abnormalities of gait and mobility: Secondary | ICD-10-CM | POA: Diagnosis not present

## 2013-10-14 DIAGNOSIS — L89309 Pressure ulcer of unspecified buttock, unspecified stage: Secondary | ICD-10-CM | POA: Diagnosis not present

## 2013-10-14 DIAGNOSIS — K589 Irritable bowel syndrome without diarrhea: Secondary | ICD-10-CM | POA: Diagnosis not present

## 2013-10-15 DIAGNOSIS — L8992 Pressure ulcer of unspecified site, stage 2: Secondary | ICD-10-CM | POA: Diagnosis not present

## 2013-10-15 DIAGNOSIS — K589 Irritable bowel syndrome without diarrhea: Secondary | ICD-10-CM | POA: Diagnosis not present

## 2013-10-15 DIAGNOSIS — R5381 Other malaise: Secondary | ICD-10-CM | POA: Diagnosis not present

## 2013-10-15 DIAGNOSIS — L89309 Pressure ulcer of unspecified buttock, unspecified stage: Secondary | ICD-10-CM | POA: Diagnosis not present

## 2013-10-15 DIAGNOSIS — R269 Unspecified abnormalities of gait and mobility: Secondary | ICD-10-CM | POA: Diagnosis not present

## 2013-10-17 DIAGNOSIS — I10 Essential (primary) hypertension: Secondary | ICD-10-CM | POA: Diagnosis not present

## 2013-10-17 DIAGNOSIS — F0390 Unspecified dementia without behavioral disturbance: Secondary | ICD-10-CM | POA: Diagnosis not present

## 2013-10-17 DIAGNOSIS — R51 Headache: Secondary | ICD-10-CM | POA: Diagnosis not present

## 2013-10-17 DIAGNOSIS — K219 Gastro-esophageal reflux disease without esophagitis: Secondary | ICD-10-CM | POA: Diagnosis not present

## 2013-10-18 DIAGNOSIS — K589 Irritable bowel syndrome without diarrhea: Secondary | ICD-10-CM | POA: Diagnosis not present

## 2013-10-18 DIAGNOSIS — L8992 Pressure ulcer of unspecified site, stage 2: Secondary | ICD-10-CM | POA: Diagnosis not present

## 2013-10-18 DIAGNOSIS — R269 Unspecified abnormalities of gait and mobility: Secondary | ICD-10-CM | POA: Diagnosis not present

## 2013-10-18 DIAGNOSIS — L89309 Pressure ulcer of unspecified buttock, unspecified stage: Secondary | ICD-10-CM | POA: Diagnosis not present

## 2013-10-18 DIAGNOSIS — R5381 Other malaise: Secondary | ICD-10-CM | POA: Diagnosis not present

## 2013-10-19 DIAGNOSIS — K222 Esophageal obstruction: Secondary | ICD-10-CM | POA: Diagnosis not present

## 2013-10-19 DIAGNOSIS — K219 Gastro-esophageal reflux disease without esophagitis: Secondary | ICD-10-CM | POA: Diagnosis not present

## 2013-10-19 DIAGNOSIS — Z87891 Personal history of nicotine dependence: Secondary | ICD-10-CM | POA: Diagnosis not present

## 2013-10-19 DIAGNOSIS — I10 Essential (primary) hypertension: Secondary | ICD-10-CM | POA: Diagnosis not present

## 2013-10-19 DIAGNOSIS — Z881 Allergy status to other antibiotic agents status: Secondary | ICD-10-CM | POA: Diagnosis not present

## 2013-10-19 DIAGNOSIS — R1310 Dysphagia, unspecified: Secondary | ICD-10-CM | POA: Diagnosis not present

## 2013-10-20 DIAGNOSIS — R269 Unspecified abnormalities of gait and mobility: Secondary | ICD-10-CM | POA: Diagnosis not present

## 2013-10-20 DIAGNOSIS — L89309 Pressure ulcer of unspecified buttock, unspecified stage: Secondary | ICD-10-CM | POA: Diagnosis not present

## 2013-10-20 DIAGNOSIS — R5381 Other malaise: Secondary | ICD-10-CM | POA: Diagnosis not present

## 2013-10-20 DIAGNOSIS — L8992 Pressure ulcer of unspecified site, stage 2: Secondary | ICD-10-CM | POA: Diagnosis not present

## 2013-10-20 DIAGNOSIS — K589 Irritable bowel syndrome without diarrhea: Secondary | ICD-10-CM | POA: Diagnosis not present

## 2013-10-22 DIAGNOSIS — L89309 Pressure ulcer of unspecified buttock, unspecified stage: Secondary | ICD-10-CM | POA: Diagnosis not present

## 2013-10-22 DIAGNOSIS — L8992 Pressure ulcer of unspecified site, stage 2: Secondary | ICD-10-CM | POA: Diagnosis not present

## 2013-10-22 DIAGNOSIS — R5381 Other malaise: Secondary | ICD-10-CM | POA: Diagnosis not present

## 2013-10-22 DIAGNOSIS — R269 Unspecified abnormalities of gait and mobility: Secondary | ICD-10-CM | POA: Diagnosis not present

## 2013-10-22 DIAGNOSIS — K589 Irritable bowel syndrome without diarrhea: Secondary | ICD-10-CM | POA: Diagnosis not present

## 2013-10-25 DIAGNOSIS — L8992 Pressure ulcer of unspecified site, stage 2: Secondary | ICD-10-CM | POA: Diagnosis not present

## 2013-10-25 DIAGNOSIS — K589 Irritable bowel syndrome without diarrhea: Secondary | ICD-10-CM | POA: Diagnosis not present

## 2013-10-25 DIAGNOSIS — R5381 Other malaise: Secondary | ICD-10-CM | POA: Diagnosis not present

## 2013-10-25 DIAGNOSIS — R269 Unspecified abnormalities of gait and mobility: Secondary | ICD-10-CM | POA: Diagnosis not present

## 2013-10-25 DIAGNOSIS — L89309 Pressure ulcer of unspecified buttock, unspecified stage: Secondary | ICD-10-CM | POA: Diagnosis not present

## 2013-10-27 DIAGNOSIS — L89309 Pressure ulcer of unspecified buttock, unspecified stage: Secondary | ICD-10-CM | POA: Diagnosis not present

## 2013-10-27 DIAGNOSIS — L8992 Pressure ulcer of unspecified site, stage 2: Secondary | ICD-10-CM | POA: Diagnosis not present

## 2013-10-27 DIAGNOSIS — R269 Unspecified abnormalities of gait and mobility: Secondary | ICD-10-CM | POA: Diagnosis not present

## 2013-10-27 DIAGNOSIS — R5381 Other malaise: Secondary | ICD-10-CM | POA: Diagnosis not present

## 2013-10-27 DIAGNOSIS — K589 Irritable bowel syndrome without diarrhea: Secondary | ICD-10-CM | POA: Diagnosis not present

## 2013-10-28 DIAGNOSIS — R5383 Other fatigue: Secondary | ICD-10-CM | POA: Diagnosis not present

## 2013-10-28 DIAGNOSIS — R269 Unspecified abnormalities of gait and mobility: Secondary | ICD-10-CM | POA: Diagnosis not present

## 2013-10-28 DIAGNOSIS — L89309 Pressure ulcer of unspecified buttock, unspecified stage: Secondary | ICD-10-CM | POA: Diagnosis not present

## 2013-10-28 DIAGNOSIS — R5381 Other malaise: Secondary | ICD-10-CM | POA: Diagnosis not present

## 2013-10-28 DIAGNOSIS — K589 Irritable bowel syndrome without diarrhea: Secondary | ICD-10-CM | POA: Diagnosis not present

## 2013-10-28 DIAGNOSIS — L8992 Pressure ulcer of unspecified site, stage 2: Secondary | ICD-10-CM | POA: Diagnosis not present

## 2013-11-01 DIAGNOSIS — R269 Unspecified abnormalities of gait and mobility: Secondary | ICD-10-CM | POA: Diagnosis not present

## 2013-11-01 DIAGNOSIS — K589 Irritable bowel syndrome without diarrhea: Secondary | ICD-10-CM | POA: Diagnosis not present

## 2013-11-01 DIAGNOSIS — L89309 Pressure ulcer of unspecified buttock, unspecified stage: Secondary | ICD-10-CM | POA: Diagnosis not present

## 2013-11-01 DIAGNOSIS — R5381 Other malaise: Secondary | ICD-10-CM | POA: Diagnosis not present

## 2013-11-01 DIAGNOSIS — L8992 Pressure ulcer of unspecified site, stage 2: Secondary | ICD-10-CM | POA: Diagnosis not present

## 2013-11-02 DIAGNOSIS — R269 Unspecified abnormalities of gait and mobility: Secondary | ICD-10-CM | POA: Diagnosis not present

## 2013-11-02 DIAGNOSIS — R5381 Other malaise: Secondary | ICD-10-CM | POA: Diagnosis not present

## 2013-11-02 DIAGNOSIS — L8992 Pressure ulcer of unspecified site, stage 2: Secondary | ICD-10-CM | POA: Diagnosis not present

## 2013-11-02 DIAGNOSIS — L89309 Pressure ulcer of unspecified buttock, unspecified stage: Secondary | ICD-10-CM | POA: Diagnosis not present

## 2013-11-02 DIAGNOSIS — R5383 Other fatigue: Secondary | ICD-10-CM | POA: Diagnosis not present

## 2013-11-02 DIAGNOSIS — K589 Irritable bowel syndrome without diarrhea: Secondary | ICD-10-CM | POA: Diagnosis not present

## 2013-11-04 DIAGNOSIS — R5381 Other malaise: Secondary | ICD-10-CM | POA: Diagnosis not present

## 2013-11-04 DIAGNOSIS — L89309 Pressure ulcer of unspecified buttock, unspecified stage: Secondary | ICD-10-CM | POA: Diagnosis not present

## 2013-11-04 DIAGNOSIS — L8992 Pressure ulcer of unspecified site, stage 2: Secondary | ICD-10-CM | POA: Diagnosis not present

## 2013-11-04 DIAGNOSIS — K589 Irritable bowel syndrome without diarrhea: Secondary | ICD-10-CM | POA: Diagnosis not present

## 2013-11-04 DIAGNOSIS — R5383 Other fatigue: Secondary | ICD-10-CM | POA: Diagnosis not present

## 2013-11-04 DIAGNOSIS — R269 Unspecified abnormalities of gait and mobility: Secondary | ICD-10-CM | POA: Diagnosis not present

## 2013-11-05 DIAGNOSIS — R269 Unspecified abnormalities of gait and mobility: Secondary | ICD-10-CM | POA: Diagnosis not present

## 2013-11-05 DIAGNOSIS — R5381 Other malaise: Secondary | ICD-10-CM | POA: Diagnosis not present

## 2013-11-05 DIAGNOSIS — L89309 Pressure ulcer of unspecified buttock, unspecified stage: Secondary | ICD-10-CM | POA: Diagnosis not present

## 2013-11-05 DIAGNOSIS — R5383 Other fatigue: Secondary | ICD-10-CM | POA: Diagnosis not present

## 2013-11-05 DIAGNOSIS — L8992 Pressure ulcer of unspecified site, stage 2: Secondary | ICD-10-CM | POA: Diagnosis not present

## 2013-11-05 DIAGNOSIS — K589 Irritable bowel syndrome without diarrhea: Secondary | ICD-10-CM | POA: Diagnosis not present

## 2013-11-09 DIAGNOSIS — R5383 Other fatigue: Secondary | ICD-10-CM | POA: Diagnosis not present

## 2013-11-09 DIAGNOSIS — R5381 Other malaise: Secondary | ICD-10-CM | POA: Diagnosis not present

## 2013-11-09 DIAGNOSIS — K589 Irritable bowel syndrome without diarrhea: Secondary | ICD-10-CM | POA: Diagnosis not present

## 2013-11-09 DIAGNOSIS — R269 Unspecified abnormalities of gait and mobility: Secondary | ICD-10-CM | POA: Diagnosis not present

## 2013-11-09 DIAGNOSIS — L89309 Pressure ulcer of unspecified buttock, unspecified stage: Secondary | ICD-10-CM | POA: Diagnosis not present

## 2013-11-09 DIAGNOSIS — L8992 Pressure ulcer of unspecified site, stage 2: Secondary | ICD-10-CM | POA: Diagnosis not present

## 2013-11-10 DIAGNOSIS — R269 Unspecified abnormalities of gait and mobility: Secondary | ICD-10-CM | POA: Diagnosis not present

## 2013-11-10 DIAGNOSIS — R5381 Other malaise: Secondary | ICD-10-CM | POA: Diagnosis not present

## 2013-11-10 DIAGNOSIS — K589 Irritable bowel syndrome without diarrhea: Secondary | ICD-10-CM | POA: Diagnosis not present

## 2013-11-10 DIAGNOSIS — L89309 Pressure ulcer of unspecified buttock, unspecified stage: Secondary | ICD-10-CM | POA: Diagnosis not present

## 2013-11-10 DIAGNOSIS — L8992 Pressure ulcer of unspecified site, stage 2: Secondary | ICD-10-CM | POA: Diagnosis not present

## 2013-11-10 DIAGNOSIS — R5383 Other fatigue: Secondary | ICD-10-CM | POA: Diagnosis not present

## 2013-11-12 DIAGNOSIS — K589 Irritable bowel syndrome without diarrhea: Secondary | ICD-10-CM | POA: Diagnosis not present

## 2013-11-12 DIAGNOSIS — R5381 Other malaise: Secondary | ICD-10-CM | POA: Diagnosis not present

## 2013-11-12 DIAGNOSIS — L8992 Pressure ulcer of unspecified site, stage 2: Secondary | ICD-10-CM | POA: Diagnosis not present

## 2013-11-12 DIAGNOSIS — R269 Unspecified abnormalities of gait and mobility: Secondary | ICD-10-CM | POA: Diagnosis not present

## 2013-11-12 DIAGNOSIS — L89309 Pressure ulcer of unspecified buttock, unspecified stage: Secondary | ICD-10-CM | POA: Diagnosis not present

## 2013-11-15 DIAGNOSIS — R5383 Other fatigue: Secondary | ICD-10-CM | POA: Diagnosis not present

## 2013-11-15 DIAGNOSIS — K589 Irritable bowel syndrome without diarrhea: Secondary | ICD-10-CM | POA: Diagnosis not present

## 2013-11-15 DIAGNOSIS — R269 Unspecified abnormalities of gait and mobility: Secondary | ICD-10-CM | POA: Diagnosis not present

## 2013-11-15 DIAGNOSIS — L89309 Pressure ulcer of unspecified buttock, unspecified stage: Secondary | ICD-10-CM | POA: Diagnosis not present

## 2013-11-15 DIAGNOSIS — R5381 Other malaise: Secondary | ICD-10-CM | POA: Diagnosis not present

## 2013-11-15 DIAGNOSIS — L8992 Pressure ulcer of unspecified site, stage 2: Secondary | ICD-10-CM | POA: Diagnosis not present

## 2013-11-16 DIAGNOSIS — K589 Irritable bowel syndrome without diarrhea: Secondary | ICD-10-CM | POA: Diagnosis not present

## 2013-11-16 DIAGNOSIS — R269 Unspecified abnormalities of gait and mobility: Secondary | ICD-10-CM | POA: Diagnosis not present

## 2013-11-16 DIAGNOSIS — R5381 Other malaise: Secondary | ICD-10-CM | POA: Diagnosis not present

## 2013-11-16 DIAGNOSIS — L89309 Pressure ulcer of unspecified buttock, unspecified stage: Secondary | ICD-10-CM | POA: Diagnosis not present

## 2013-11-16 DIAGNOSIS — L8992 Pressure ulcer of unspecified site, stage 2: Secondary | ICD-10-CM | POA: Diagnosis not present

## 2013-11-22 DIAGNOSIS — K200 Eosinophilic esophagitis: Secondary | ICD-10-CM | POA: Diagnosis not present

## 2013-11-22 DIAGNOSIS — R197 Diarrhea, unspecified: Secondary | ICD-10-CM | POA: Diagnosis not present

## 2013-11-23 ENCOUNTER — Ambulatory Visit: Payer: Medicare Other | Admitting: Neurology

## 2013-12-07 DIAGNOSIS — R1310 Dysphagia, unspecified: Secondary | ICD-10-CM | POA: Diagnosis not present

## 2013-12-07 DIAGNOSIS — Z882 Allergy status to sulfonamides status: Secondary | ICD-10-CM | POA: Diagnosis not present

## 2013-12-07 DIAGNOSIS — Z87891 Personal history of nicotine dependence: Secondary | ICD-10-CM | POA: Diagnosis not present

## 2013-12-07 DIAGNOSIS — K222 Esophageal obstruction: Secondary | ICD-10-CM | POA: Diagnosis not present

## 2013-12-07 DIAGNOSIS — Z79899 Other long term (current) drug therapy: Secondary | ICD-10-CM | POA: Diagnosis not present

## 2013-12-07 DIAGNOSIS — H4900 Third [oculomotor] nerve palsy, unspecified eye: Secondary | ICD-10-CM | POA: Diagnosis not present

## 2013-12-29 DIAGNOSIS — Z8719 Personal history of other diseases of the digestive system: Secondary | ICD-10-CM | POA: Diagnosis not present

## 2013-12-29 DIAGNOSIS — I10 Essential (primary) hypertension: Secondary | ICD-10-CM | POA: Diagnosis not present

## 2013-12-29 DIAGNOSIS — Z87891 Personal history of nicotine dependence: Secondary | ICD-10-CM | POA: Diagnosis not present

## 2013-12-29 DIAGNOSIS — K219 Gastro-esophageal reflux disease without esophagitis: Secondary | ICD-10-CM | POA: Diagnosis not present

## 2013-12-29 DIAGNOSIS — Z881 Allergy status to other antibiotic agents status: Secondary | ICD-10-CM | POA: Diagnosis not present

## 2013-12-29 DIAGNOSIS — K222 Esophageal obstruction: Secondary | ICD-10-CM | POA: Diagnosis not present

## 2013-12-29 DIAGNOSIS — Z86010 Personal history of colonic polyps: Secondary | ICD-10-CM | POA: Diagnosis not present

## 2014-01-05 DIAGNOSIS — G609 Hereditary and idiopathic neuropathy, unspecified: Secondary | ICD-10-CM | POA: Diagnosis not present

## 2014-01-05 DIAGNOSIS — Z0000 Encounter for general adult medical examination without abnormal findings: Secondary | ICD-10-CM | POA: Diagnosis not present

## 2014-01-05 DIAGNOSIS — Z1331 Encounter for screening for depression: Secondary | ICD-10-CM | POA: Diagnosis not present

## 2014-01-05 DIAGNOSIS — G2589 Other specified extrapyramidal and movement disorders: Secondary | ICD-10-CM | POA: Diagnosis not present

## 2014-01-05 DIAGNOSIS — I10 Essential (primary) hypertension: Secondary | ICD-10-CM | POA: Diagnosis not present

## 2014-01-05 DIAGNOSIS — N183 Chronic kidney disease, stage 3 unspecified: Secondary | ICD-10-CM | POA: Diagnosis not present

## 2014-01-05 DIAGNOSIS — K222 Esophageal obstruction: Secondary | ICD-10-CM | POA: Diagnosis not present

## 2014-01-05 DIAGNOSIS — K219 Gastro-esophageal reflux disease without esophagitis: Secondary | ICD-10-CM | POA: Diagnosis not present

## 2014-01-05 DIAGNOSIS — F0390 Unspecified dementia without behavioral disturbance: Secondary | ICD-10-CM | POA: Diagnosis not present

## 2014-01-12 DIAGNOSIS — K222 Esophageal obstruction: Secondary | ICD-10-CM | POA: Diagnosis not present

## 2014-01-12 DIAGNOSIS — K2100 Gastro-esophageal reflux disease with esophagitis, without bleeding: Secondary | ICD-10-CM | POA: Diagnosis not present

## 2014-01-12 DIAGNOSIS — K200 Eosinophilic esophagitis: Secondary | ICD-10-CM | POA: Diagnosis not present

## 2014-01-12 DIAGNOSIS — B3781 Candidal esophagitis: Secondary | ICD-10-CM | POA: Diagnosis not present

## 2014-01-12 DIAGNOSIS — K219 Gastro-esophageal reflux disease without esophagitis: Secondary | ICD-10-CM | POA: Diagnosis not present

## 2014-01-12 DIAGNOSIS — I10 Essential (primary) hypertension: Secondary | ICD-10-CM | POA: Diagnosis not present

## 2014-01-13 ENCOUNTER — Encounter: Payer: Self-pay | Admitting: Neurology

## 2014-01-13 ENCOUNTER — Ambulatory Visit (INDEPENDENT_AMBULATORY_CARE_PROVIDER_SITE_OTHER): Payer: Medicare Other | Admitting: Neurology

## 2014-01-13 VITALS — BP 134/76 | HR 56 | Wt 139.3 lb

## 2014-01-13 DIAGNOSIS — G2581 Restless legs syndrome: Secondary | ICD-10-CM

## 2014-01-13 DIAGNOSIS — R269 Unspecified abnormalities of gait and mobility: Secondary | ICD-10-CM | POA: Diagnosis not present

## 2014-01-13 DIAGNOSIS — G609 Hereditary and idiopathic neuropathy, unspecified: Secondary | ICD-10-CM | POA: Diagnosis not present

## 2014-01-13 MED ORDER — DONEPEZIL HCL 10 MG PO TABS: 10.0000 mg | ORAL_TABLET | Freq: Every day | ORAL | Status: DC

## 2014-01-13 NOTE — Progress Notes (Signed)
[facility]  Follow-up Visit   Date: 01/13/2014    Stacey Jefferson MRN: [id number] DOB: 12-23-1924   Interim History: Stacey Jefferson is a 80 y.o. right-handed Caucasian female with history of peripheral neuropathy, RLS, CN III palsy, and esphageal stricture, and GERD here for follow-up of RLS. She was last seen in the 08/24/2013.  She is accompanied by her daughter, Stacey Jefferson ([place]).  History of present illness:   She was previously followed by Dr. Erling Cruz who had been managing her RLS for the past 10 years and saw Dr. Krista Blue at [facility] in June 2014 for the same symptoms. Per Dr. Rhea Belton clinic note, patient has had a normal EMG in 2001. She has was on Requip and then started Mirapex in 2001. In June 2014, she was taking mirapex 0.5mg  TID, sinemet 25/100 qhs, and clonazepam 0.5 mg once at night without significant change in her symptoms.  She complains of involuntary movements of the legs when at rest. For instance, she is at church her legs will start jumping. Mirapex was stopped in the summer because she was complaining of excessive day-time sleepiness.  - Follow-up 08/24/2013:  Since stopping Mirapex, daytime sleepiness improved. She has sinemet 25/100 at bedtime and clonazepam 0.5mg  which seems to control her RLS symptoms.  She currently lives alone and has an aide that comes once per week. Her daughter also helps to take care of her needs.  - Follow-up 01/13/2014:  She reports having improvement of nightmares after taking Aricept in the morning. RLS symptoms continue to fluctuate. She had one interval hospitalization in November for diarrhea and dehydration. Since then, her daughter, Stacey Jefferson, has been living with patient.  She is planning on getting home care again.  She is able to dress and cook small, simple meals for herself.  Her biggest issues are with swallowing related to esophageal stricture and reports losing 10lb since her last visit.   Medications:  Current Outpatient  Prescriptions on File Prior to Visit  Medication Sig Dispense Refill  . carbidopa-levodopa (SINEMET) 25-100 MG per tablet Take 1 tablet by mouth at bedtime.  90 tablet  3  . clonazePAM (KLONOPIN) 0.5 MG tablet Take 1 tablet (0.5 mg total) by mouth at bedtime as needed.  90 tablet  3  . Cyanocobalamin (VITAMIN B 12 PO) Take by mouth as directed.      . donepezil (ARICEPT) 10 MG tablet Take 5 mg by mouth daily.       . fluconazole (DIFLUCAN) 40 MG/ML suspension Take 5 mLs (200 mg total) by mouth daily.  35 mL  0  . omeprazole (PRILOSEC) 2 mg/mL SUSP Take 20 mg by mouth daily.      . Salicylic Acid (SALACYN) 6 % LOTN Apply topically as directed.       No current facility-administered medications on file prior to visit.    Allergies:  Allergies  Allergen Reactions  . Sulfa Antibiotics      Review of Systems:  CONSTITUTIONAL: No fevers, chills, night sweats, +weight loss.   EYES: No visual changes or eye pain ENT: No hearing changes.  No history of nose bleeds.   RESPIRATORY: No cough, wheezing and shortness of breath.   CARDIOVASCULAR: Negative for chest pain, and palpitations.   GI: Negative for abdominal discomfort, blood in stools or black stools.  No recent change in bowel habits.   GU:  No history of incontinence.   MUSCLOSKELETAL:+ joint pain or swelling.  No myalgias.   SKIN: Negative  for lesions, rash, and itching.   HEMATOLOGY/ONCOLOGY: Negative for prolonged bleeding, bruising easily, and swollen nodes.   ENDOCRINE: Negative for cold or heat intolerance, polydipsia or goiter.   PSYCH:  No depression or anxiety symptoms.   NEURO: As Above.   Vital Signs:  BP 134/76  Pulse 56  Wt 139 lb 5 oz (63.192 kg)  SpO2 99%  Neurological Exam:  MENTAL STATUS including orientation to time, place, person, recent and remote memory, attention span and concentration, language, and fund of knowledge is fair. Tangential thought content.  Speech is not dysarthric.   CRANIAL NERVES:  Pupils are round and reactive to light, left cranial nerve III palsy on left side with limited medial gaze, downward gaze, and upgaze (old). There is moderatel left eye ptosis. Extraocular muscles of the right eye is intact. Face is symmetric. Pathological facial reflexes were present. Palate elevates symmetrically. Tongue is midline.   MOTOR: Mild generalized atrophy. Motor strength of the proximal upper extremities is 4/5, 5/5 distally in the arms.  Lower extremities are both 4+/5.  No fasciculations or abnormal movements. No pronator drift. Tone is normal.   MSRs:  Right      Left  brachioradialis  1+   brachioradialis  1+   biceps  1+   biceps  1+   triceps  1+   triceps  1+   patellar  0   patellar  0   ankle jerk  0   ankle jerk  0   Hoffman  no   Hoffman  no   plantar response  mute   plantar response  mute    SENSORY:  Reduced vibration at the knees and hands bilaterally.  COORDINATION/GAIT: Mild dysmetria with finger-to- nose-finger. Movements are slowed throughout.   DATA: Labs 07/20/2013:  Copper 145, ceruloplasmin 37, fT3 2.7, fT4 0.83, MMA 0.18, vitamin B12 1480, HbA1c 5.3, ferritin 98.6, TSH 1.23   IMPRESSION/PLAN:  1.  Restless leg syndrome; clinically stable  - Previously tried mirapex and requip, but stopped during summer 2014 due to excessive sleepiness  - Ferritin 98 (06/2013)  - Increase sinemet 25/100mg  twice daily  - Continue clonazepam 0.5mg  at bedtime 2.  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  Memory loss  - Refills for 90-day supply of Aricept 10mg  given 4.  Return to clinic in 15-months  The duration of this appointment visit was 30 minutes of face-to-face time with the patient.  Greater  than 50% of this time was spent in counseling, explanation of diagnosis, planning of further management, and coordination of care.   Thank you for allowing me to participate in patient's care.  If I can answer any additional questions, I would be pleased to do so.    Sincerely,    Julio Zappia K. Posey Pronto, DO

## 2014-01-13 NOTE — Patient Instructions (Addendum)
1.  Increase sinemet to 25/100mg  one tablet twice daily.  Take one hour prior to meals.  If it makes you nauseas, take with crackers. 2.  Continue clonazepam 0.5mg  at bedtime 3.  Continue Aricept 10mg  daily 4.  Return to clinic 66-month, or sooner as needed  [facility]  Preventing Falls in the [place] are common, often dreaded events in the lives of older people. Aside from the obvious injuries and even death that may result, falls can cause wide-ranging consequences including loss of independence, mental decline, decreased activity, and mobility. Younger people are also at risk of falling, especially those with chronic illnesses and fatigue.  Ways to reduce the risk for falling:  * Examine diet and medications. Warm foods and alcohol dilate blood vessels, which can lead to dizziness when standing. Sleep aids, antidepressants, and pain medications can also increase the likelihood of a fall.  * Get a vison exam. Poor vision, cataracts, and glaucoma increase the chances of falling.  * Check foot gear. Shoes should fit snugly and have a sturdy, nonskid sole and broad, low heel.  * Participate in a physician-approved exercise program to build and maintain muscle strength and improve balance and coordination.  * Increase vitamin D intake. Vitamin D improves muscle strength and increases the amount of calcium the body is able to absorb and deposit in bones.  How to prevent falls from common hazards:  * Floors - Remove all loose wires, cords, and throw rugs. Minimize clutter. Make sure rugs are anchored and smooth. Keep furniture in its usual place.  * Chairs - Use chairs with straight backs, armrests, and firm seats. Add firm cushions to existing pieces to add height.  * Bathroom - Install grab bars and non-skid tape in the tub or shower. Use a bathtub transfer bench or a shower chair with a back support. Use an elevated toilet seat and/or safety rails to assist standing from a low surface. Do  not use towel racks or bathroom tissue holders to help you stand.  * Lighting - Make sure halls, stairways, and entrances are well-lit. Install a night light in your bathroom or hallway. Make sure there is a light switch at the top and bottom of the staircase. Turn lights on if you get up in the middle of the night. Make sure lamps or light switches are within reach of the bed if you have to get up during the night.  * Kitchen - Install non-skid rubber mats near the sink and stove. Clean spills immediately. Store frequently used utensils, pots, and pans between waist and eye level. This helps prevent reaching and bending. Sit when getting things out of the lower cupboards.  * Living room / [place] furniture with wide spaces in between, giving enough room to move around. Establish a route through the living room that gives you something to hold onto as you walk.  * Stairs - Make sure treads, rails, and rugs are secure. Install a rail on both sides of the stairs. If stairs are a threat, it might be helpful to arrange most of your activities on the lower level to reduce the number of times you must climb the stairs.  * Entrances and doorways - Install metal handles on the walls adjacent to the doorknobs of all doors to make it more secure as you travel through the doorway.  Tips for maintaining balance:  * Keep at least one hand free at all times Try using a  backpack or fanny pack to hold things rather than carrying them in your hands. Never carry objects in both hands when walking as this interferes with keeping your balance.  * Attempt to swing both arms from front to back while walking. This might require a conscious effort if Parkinson's disease has diminished your movement. It will, however, help you to maintain balance and posture, and reduce fatigue.  * Consciously lift your feet off the ground when walking. Shuffling and dragging of the feet is a common culprit in losing your balance.  * When  trying to navigate turns, use a "U" technique of facing forward and making a wide turn, rather than pivoting sharply.  * Try to stand with your feet shoulder-length apart. When your feet are close together for any length of time, you increase your risk of losing your balance and falling.  * Do one thing at a time. Do not try to walk and accomplish another task, such as reading or looking around. The decrease in your automatic reflexes complicates motor function, so the less distraction, the better.  * Do not wear rubber or gripping soled shoes, they might "catch" on the floor and cause tripping.  * Move slowly when changing positions. Use deliberate, concentrated movements and, if needed, use a grab bar or walking aid. Count fifteen (15) seconds after standing to begin walking.  * If balance is a continuous problem, you might want to consider a walking aid such as a cane, walking stick, or walker. Once you have mastered walking with help, you may be ready to try it again on your own.  This information is provided by [facility] and is not intended to replace the medical advice of your physician or other health care providers. Please consult your physician or other health care providers for advice regarding your specific medical condition.

## 2014-01-27 DIAGNOSIS — K222 Esophageal obstruction: Secondary | ICD-10-CM | POA: Diagnosis not present

## 2014-01-27 DIAGNOSIS — Z87891 Personal history of nicotine dependence: Secondary | ICD-10-CM | POA: Diagnosis not present

## 2014-01-27 DIAGNOSIS — K200 Eosinophilic esophagitis: Secondary | ICD-10-CM | POA: Diagnosis not present

## 2014-02-02 DIAGNOSIS — M545 Low back pain, unspecified: Secondary | ICD-10-CM | POA: Diagnosis not present

## 2014-02-02 DIAGNOSIS — M1710 Unilateral primary osteoarthritis, unspecified knee: Secondary | ICD-10-CM | POA: Diagnosis not present

## 2014-02-02 DIAGNOSIS — IMO0002 Reserved for concepts with insufficient information to code with codable children: Secondary | ICD-10-CM | POA: Diagnosis not present

## 2014-02-02 DIAGNOSIS — M75120 Complete rotator cuff tear or rupture of unspecified shoulder, not specified as traumatic: Secondary | ICD-10-CM | POA: Diagnosis not present

## 2014-02-10 DIAGNOSIS — R1310 Dysphagia, unspecified: Secondary | ICD-10-CM | POA: Diagnosis not present

## 2014-02-10 DIAGNOSIS — K222 Esophageal obstruction: Secondary | ICD-10-CM | POA: Diagnosis not present

## 2014-02-10 DIAGNOSIS — Z87891 Personal history of nicotine dependence: Secondary | ICD-10-CM | POA: Diagnosis not present

## 2014-03-02 DIAGNOSIS — M1710 Unilateral primary osteoarthritis, unspecified knee: Secondary | ICD-10-CM | POA: Diagnosis not present

## 2014-03-03 DIAGNOSIS — Z881 Allergy status to other antibiotic agents status: Secondary | ICD-10-CM | POA: Diagnosis not present

## 2014-03-03 DIAGNOSIS — R1310 Dysphagia, unspecified: Secondary | ICD-10-CM | POA: Diagnosis not present

## 2014-03-03 DIAGNOSIS — F0390 Unspecified dementia without behavioral disturbance: Secondary | ICD-10-CM | POA: Diagnosis not present

## 2014-03-03 DIAGNOSIS — I10 Essential (primary) hypertension: Secondary | ICD-10-CM | POA: Diagnosis not present

## 2014-03-03 DIAGNOSIS — G2581 Restless legs syndrome: Secondary | ICD-10-CM | POA: Diagnosis not present

## 2014-03-03 DIAGNOSIS — K222 Esophageal obstruction: Secondary | ICD-10-CM | POA: Diagnosis not present

## 2014-03-03 DIAGNOSIS — K229 Disease of esophagus, unspecified: Secondary | ICD-10-CM | POA: Diagnosis not present

## 2014-03-03 DIAGNOSIS — K200 Eosinophilic esophagitis: Secondary | ICD-10-CM | POA: Diagnosis not present

## 2014-03-03 DIAGNOSIS — Z87891 Personal history of nicotine dependence: Secondary | ICD-10-CM | POA: Diagnosis not present

## 2014-03-10 ENCOUNTER — Telehealth: Payer: Self-pay | Admitting: Neurology

## 2014-03-10 DIAGNOSIS — M1710 Unilateral primary osteoarthritis, unspecified knee: Secondary | ICD-10-CM | POA: Diagnosis not present

## 2014-03-10 NOTE — Telephone Encounter (Signed)
Called patient's daughter and she said that mom needs Sinemet.  Ok to give 90 day per Dr. Posey Pronto.

## 2014-03-10 NOTE — Telephone Encounter (Signed)
Tried calling number, but it was busy.  Caryl Pina, can you find out what medications need refills?  Cydni Reddoch K. Posey Pronto, DO

## 2014-03-10 NOTE — Telephone Encounter (Signed)
Please call patient regarding refill / Stacey S.

## 2014-03-11 ENCOUNTER — Other Ambulatory Visit: Payer: Self-pay | Admitting: *Deleted

## 2014-03-11 MED ORDER — CARBIDOPA-LEVODOPA 25-100 MG PO TABS: 1.0000 | ORAL_TABLET | Freq: Every day | ORAL | Status: DC

## 2014-03-11 NOTE — Telephone Encounter (Signed)
Order sent.

## 2014-03-17 DIAGNOSIS — M1710 Unilateral primary osteoarthritis, unspecified knee: Secondary | ICD-10-CM | POA: Diagnosis not present

## 2014-03-28 ENCOUNTER — Other Ambulatory Visit: Payer: Self-pay | Admitting: *Deleted

## 2014-03-28 ENCOUNTER — Telehealth: Payer: Self-pay | Admitting: Neurology

## 2014-03-28 MED ORDER — CARBIDOPA-LEVODOPA 25-100 MG PO TABS: 1.0000 | ORAL_TABLET | Freq: Two times a day (BID) | ORAL | Status: DC

## 2014-03-28 NOTE — Telephone Encounter (Signed)
Daughter needs to talk to someone please call her back at [phone]

## 2014-03-28 NOTE — Telephone Encounter (Signed)
I spoke with patient's daughter and she said that the mail order pharmacy said that they did not get Rx for Sinemet.  Informed her that I sent it in but that I would send it in again.  Also sent in another Rx to [facility] [address] for #28 pills to get her through until the Rx comes in.

## 2014-04-07 DIAGNOSIS — L608 Other nail disorders: Secondary | ICD-10-CM | POA: Diagnosis not present

## 2014-04-07 DIAGNOSIS — M2040 Other hammer toe(s) (acquired), unspecified foot: Secondary | ICD-10-CM | POA: Diagnosis not present

## 2014-04-07 DIAGNOSIS — L84 Corns and callosities: Secondary | ICD-10-CM | POA: Diagnosis not present

## 2014-04-08 DIAGNOSIS — Z961 Presence of intraocular lens: Secondary | ICD-10-CM | POA: Diagnosis not present

## 2014-04-08 DIAGNOSIS — H02409 Unspecified ptosis of unspecified eyelid: Secondary | ICD-10-CM | POA: Diagnosis not present

## 2014-04-08 DIAGNOSIS — H18519 Endothelial corneal dystrophy, unspecified eye: Secondary | ICD-10-CM | POA: Diagnosis not present

## 2014-04-08 DIAGNOSIS — H3530 Unspecified macular degeneration: Secondary | ICD-10-CM | POA: Diagnosis not present

## 2014-04-08 DIAGNOSIS — H00019 Hordeolum externum unspecified eye, unspecified eyelid: Secondary | ICD-10-CM | POA: Diagnosis not present

## 2014-04-08 DIAGNOSIS — H4900 Third [oculomotor] nerve palsy, unspecified eye: Secondary | ICD-10-CM | POA: Diagnosis not present

## 2014-04-08 DIAGNOSIS — H04129 Dry eye syndrome of unspecified lacrimal gland: Secondary | ICD-10-CM | POA: Diagnosis not present

## 2014-04-19 ENCOUNTER — Ambulatory Visit: Payer: Medicare Other | Admitting: Neurology

## 2014-05-02 DIAGNOSIS — M4120 Other idiopathic scoliosis, site unspecified: Secondary | ICD-10-CM | POA: Diagnosis not present

## 2014-05-05 DIAGNOSIS — K222 Esophageal obstruction: Secondary | ICD-10-CM | POA: Diagnosis not present

## 2014-05-05 DIAGNOSIS — K200 Eosinophilic esophagitis: Secondary | ICD-10-CM | POA: Diagnosis not present

## 2014-05-06 ENCOUNTER — Other Ambulatory Visit: Payer: Self-pay | Admitting: *Deleted

## 2014-05-06 ENCOUNTER — Telehealth: Payer: Self-pay | Admitting: Neurology

## 2014-05-06 MED ORDER — CARBIDOPA-LEVODOPA 25-100 MG PO TABS: 1.0000 | ORAL_TABLET | Freq: Three times a day (TID) | ORAL | Status: DC

## 2014-05-06 MED ORDER — CLONAZEPAM 0.5 MG PO TABS: 0.5000 mg | ORAL_TABLET | Freq: Every evening | ORAL | Status: AC | PRN

## 2014-05-06 NOTE — Telephone Encounter (Signed)
Attempted to contact patient.  No answer and no voicemail. 

## 2014-05-06 NOTE — Telephone Encounter (Signed)
Pt's daughter called returning your call at 3:46PM [phone]

## 2014-05-06 NOTE — Telephone Encounter (Signed)
Needs refill on Klonopin but wants to considering increasing dose due to RLS. Uses [facility] # [phone]. Rx# [id number]. Requests 90 day supply. Call pt at [phone] / Sherri S.

## 2014-05-06 NOTE — Telephone Encounter (Signed)
Called patient's daughter and left message for her to call me or have her mom call me if she can get in touch with her.

## 2014-05-06 NOTE — Telephone Encounter (Signed)
Spoke with patient's daughter and explained about the medicines.  Faxed new prescriptions to [facility].

## 2014-05-06 NOTE — Telephone Encounter (Signed)
She can increase sinemet to three times daily.  Want her to stay on clonazepam 0.5mg  at bedtime, ok to refill, #90, 3 refills.  Isabelle Matt K. Posey Pronto, DO

## 2014-05-06 NOTE — Telephone Encounter (Signed)
I will need to fax Rx.

## 2014-05-10 DIAGNOSIS — F0390 Unspecified dementia without behavioral disturbance: Secondary | ICD-10-CM | POA: Diagnosis not present

## 2014-05-10 DIAGNOSIS — N183 Chronic kidney disease, stage 3 unspecified: Secondary | ICD-10-CM | POA: Diagnosis not present

## 2014-05-10 DIAGNOSIS — G2589 Other specified extrapyramidal and movement disorders: Secondary | ICD-10-CM | POA: Diagnosis not present

## 2014-05-10 DIAGNOSIS — G609 Hereditary and idiopathic neuropathy, unspecified: Secondary | ICD-10-CM | POA: Diagnosis not present

## 2014-05-10 DIAGNOSIS — K219 Gastro-esophageal reflux disease without esophagitis: Secondary | ICD-10-CM | POA: Diagnosis not present

## 2014-05-10 DIAGNOSIS — I10 Essential (primary) hypertension: Secondary | ICD-10-CM | POA: Diagnosis not present

## 2014-05-26 DIAGNOSIS — I10 Essential (primary) hypertension: Secondary | ICD-10-CM | POA: Diagnosis not present

## 2014-05-26 DIAGNOSIS — K222 Esophageal obstruction: Secondary | ICD-10-CM | POA: Diagnosis not present

## 2014-05-26 DIAGNOSIS — Z87891 Personal history of nicotine dependence: Secondary | ICD-10-CM | POA: Diagnosis not present

## 2014-05-26 DIAGNOSIS — B3781 Candidal esophagitis: Secondary | ICD-10-CM | POA: Diagnosis not present

## 2014-05-26 DIAGNOSIS — R1310 Dysphagia, unspecified: Secondary | ICD-10-CM | POA: Diagnosis not present

## 2014-06-07 DIAGNOSIS — M2040 Other hammer toe(s) (acquired), unspecified foot: Secondary | ICD-10-CM | POA: Diagnosis not present

## 2014-06-07 DIAGNOSIS — M79609 Pain in unspecified limb: Secondary | ICD-10-CM | POA: Diagnosis not present

## 2014-06-07 DIAGNOSIS — L608 Other nail disorders: Secondary | ICD-10-CM | POA: Diagnosis not present

## 2014-07-19 ENCOUNTER — Ambulatory Visit (INDEPENDENT_AMBULATORY_CARE_PROVIDER_SITE_OTHER): Payer: Medicare Other | Admitting: Neurology

## 2014-07-19 ENCOUNTER — Encounter: Payer: Self-pay | Admitting: Neurology

## 2014-07-19 VITALS — BP 110/60 | HR 66 | Ht 62.0 in | Wt 150.3 lb

## 2014-07-19 DIAGNOSIS — G2581 Restless legs syndrome: Secondary | ICD-10-CM

## 2014-07-19 DIAGNOSIS — G609 Hereditary and idiopathic neuropathy, unspecified: Secondary | ICD-10-CM | POA: Diagnosis not present

## 2014-07-19 DIAGNOSIS — F0390 Unspecified dementia without behavioral disturbance: Secondary | ICD-10-CM | POA: Diagnosis not present

## 2014-07-19 NOTE — Progress Notes (Signed)
[facility]  Follow-up Visit   Date: 07/19/2014    Stacey Jefferson MRN: [id number] DOB: 10/26/1925   Interim History: Stacey Jefferson is a 80 y.o. right-handed Caucasian female with history of peripheral neuropathy, RLS, CN III palsy, and esphageal stricture, and GERD here for follow-up of RLS. She was last seen in the 08/24/2013.  She is accompanied by her daughters.   History of present illness:   She was previously followed by Dr. Erling Cruz who had been managing her RLS for the past 10 years and saw Dr. Krista Blue at [facility] in June 2014 for the same symptoms. Per Dr. Rhea Belton clinic note, patient has had a normal EMG in 2001. She has was on Requip and then started Mirapex in 2001. In June 2014, she was taking mirapex 0.5mg  TID, sinemet 25/100 qhs, and clonazepam 0.5 mg once at night without significant change in her symptoms.  She complains of involuntary movements of the legs when at rest. For instance, she is at church her legs will start jumping. Mirapex was stopped in the summer because she was complaining of excessive day-time sleepiness.  - Follow-up 08/24/2013:  Since stopping Mirapex, daytime sleepiness improved. She has sinemet 25/100 at bedtime and clonazepam 0.5mg  which seems to control her RLS symptoms.  She currently lives alone and has an aide that comes.  - Follow-up 01/13/2014:  She reports having improvement of nightmares after taking Aricept in the morning. RLS symptoms continue to fluctuate. She had one interval hospitalization in November for diarrhea and dehydration. Since then, her daughter, [name], has been living with patient.  She is able to dress and cook small, simple meals for herself.    - Follow-up 07/19/2014: She had no interval hospitalizations, falls, or illnesses.  She reports having gradual decline is overall health. She is now giving herself sponge bath in the bed because she is too unsteady to be in the bathroom and does not want her daughter to help.  She  is complaining of swollen legs, knees pain, and numbness of her hands.  She is getting home health 3 days per week. No significant worsening of RLS or memory.   Medications:  Current Outpatient Prescriptions on File Prior to Visit  Medication Sig Dispense Refill  . acetaminophen (TYLENOL) 160 MG/5ML liquid Take by mouth every 4 (four) hours as needed for fever.      . bisoprolol (ZEBETA) 5 MG tablet Take 5 mg by mouth at bedtime.      . carbidopa-levodopa (SINEMET) 25-100 MG per tablet Take 1 tablet by mouth 3 (three) times daily.  90 tablet  3  . clonazePAM (KLONOPIN) 0.5 MG tablet Take 1 tablet (0.5 mg total) by mouth at bedtime as needed.  90 tablet  3  . Cyanocobalamin (VITAMIN B 12 PO) Take by mouth as directed.      . diphenoxylate-atropine (LOMOTIL) 2.5-0.025 MG per tablet Take by mouth 3 (three) times daily as needed for diarrhea or loose stools.      . donepezil (ARICEPT) 10 MG tablet Take 1 tablet (10 mg total) by mouth daily.  90 tablet  3  . fluconazole (DIFLUCAN) 40 MG/ML suspension Take 5 mLs (200 mg total) by mouth daily.  35 mL  0  . omeprazole (PRILOSEC) 2 mg/mL SUSP Take 20 mg by mouth daily.      . Salicylic Acid (SALACYN) 6 % LOTN Apply topically as directed.       No current facility-administered medications on file prior to  visit.    Allergies:  Allergies  Allergen Reactions  . Sulfa Antibiotics      Review of Systems:  CONSTITUTIONAL: No fevers, chills, night sweats, +weight loss.   EYES: No visual changes or eye pain ENT: No hearing changes.  No history of nose bleeds.   RESPIRATORY: No cough, wheezing and shortness of breath.   CARDIOVASCULAR: Negative for chest pain, and palpitations.   GI: Negative for abdominal discomfort, blood in stools or black stools.  No recent change in bowel habits.   GU:  No history of incontinence.   MUSCLOSKELETAL:+ joint pain or swelling.  No myalgias.   SKIN: Negative for lesions, rash, and itching.   HEMATOLOGY/ONCOLOGY:  Negative for prolonged bleeding, bruising easily, and swollen nodes.   ENDOCRINE: Negative for cold or heat intolerance, polydipsia or goiter.   PSYCH:  No depression or anxiety symptoms.   NEURO: As Above.   Vital Signs:  BP 110/60  Pulse 66  Ht 5\' 2"  (1.575 m)  Wt 150 lb 5 oz (68.181 kg)  BMI 27.49 kg/m2  SpO2 93%  Neurological Exam:  MENTAL STATUS including orientation to time, place, person, recent and remote memory, attention span and concentration, language, and fund of knowledge is fair. Tangential thought content.  Speech is not dysarthric.   CRANIAL NERVES: Pupils are round and reactive to light, left cranial nerve III palsy on left side with limited medial gaze, downward gaze, and upgaze (old). There is moderatel left eye ptosis. Extraocular muscles of the right eye is intact.    MOTOR: Mild generalized atrophy. Motor strength is antigravity throughout.  Tone is normal.   MSRs:  Right      Left  brachioradialis  1+   brachioradialis  1+   biceps  1+   biceps  1+   triceps  1+   triceps  1+   patellar  0   patellar  0   ankle jerk  0   ankle jerk  0    SENSORY:  Reduced vibration at the knees and hands bilaterally.  COORDINATION/GAIT: Movements are slowed throughout.   DATA: Labs 07/20/2013:  Copper 145, ceruloplasmin 37, fT3 2.7, fT4 0.83, MMA 0.18, vitamin B12 1480, HbA1c 5.3, ferritin 98.6, TSH 1.23   IMPRESSION/PLAN:  1.  Restless leg syndrome; clinically stable  - Previously tried mirapex and requip, but stopped during summer 2014 due to excessive sleepiness  - Ferritin 98 (06/2013)  - Increase sinemet 25/100mg  three times daily  - Continue clonazepam 0.5mg  at bedtime 2.  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  Dementia   -  Continue Aricept 10mg  given, given hallucinations, will not increase the medication further  - She has developed skin rash to exelon patch 4.  Generalized deconditioning 5.  Return to clinic in 90-months  The duration of this appointment visit was 30 minutes of face-to-face time with the patient.  Greater than 50% of this time was spent in counseling, explanation of diagnosis, planning of further management, and coordination of care.   Thank you for allowing me to participate in patient's care.  If I can answer any additional questions, I would be pleased to do so.    Sincerely,    Donika K. Posey Pronto, DO

## 2014-07-19 NOTE — Patient Instructions (Signed)
Continue your medications as you are taking them I'll see you back in 62-month

## 2014-07-25 ENCOUNTER — Telehealth: Payer: Self-pay | Admitting: Neurology

## 2014-07-25 NOTE — Telephone Encounter (Signed)
Pt needs to talk to someone about medication please call [phone] please speak slowly and loud

## 2014-07-25 NOTE — Telephone Encounter (Signed)
Attempted to call patient again and phone was busy.

## 2014-07-25 NOTE — Telephone Encounter (Signed)
Attempted to call patient back.  Phone was busy.

## 2014-07-26 NOTE — Telephone Encounter (Signed)
Patient got refill on carbidopa-levodopa and it says to take one at bedtime.  The other Rx said for her to take it tid.  I will check with Dr. Posey Pronto and let her know.

## 2014-07-26 NOTE — Telephone Encounter (Signed)
I checked in patient med list and in Dr. Serita Grit last note and it is supposed to be take tid.  Patient notified.

## 2014-09-13 DIAGNOSIS — B351 Tinea unguium: Secondary | ICD-10-CM | POA: Diagnosis not present

## 2014-09-19 DIAGNOSIS — M1711 Unilateral primary osteoarthritis, right knee: Secondary | ICD-10-CM | POA: Diagnosis not present

## 2014-09-19 DIAGNOSIS — M1712 Unilateral primary osteoarthritis, left knee: Secondary | ICD-10-CM | POA: Diagnosis not present

## 2014-09-26 DIAGNOSIS — M1711 Unilateral primary osteoarthritis, right knee: Secondary | ICD-10-CM | POA: Diagnosis not present

## 2014-09-26 DIAGNOSIS — M1712 Unilateral primary osteoarthritis, left knee: Secondary | ICD-10-CM | POA: Diagnosis not present

## 2014-09-27 DIAGNOSIS — N183 Chronic kidney disease, stage 3 (moderate): Secondary | ICD-10-CM | POA: Diagnosis not present

## 2014-09-27 DIAGNOSIS — I10 Essential (primary) hypertension: Secondary | ICD-10-CM | POA: Diagnosis not present

## 2014-09-27 DIAGNOSIS — G301 Alzheimer's disease with late onset: Secondary | ICD-10-CM | POA: Diagnosis not present

## 2014-09-27 DIAGNOSIS — F322 Major depressive disorder, single episode, severe without psychotic features: Secondary | ICD-10-CM | POA: Diagnosis not present

## 2014-09-27 DIAGNOSIS — G2581 Restless legs syndrome: Secondary | ICD-10-CM | POA: Diagnosis not present

## 2014-09-27 DIAGNOSIS — L989 Disorder of the skin and subcutaneous tissue, unspecified: Secondary | ICD-10-CM | POA: Diagnosis not present

## 2014-09-27 DIAGNOSIS — K219 Gastro-esophageal reflux disease without esophagitis: Secondary | ICD-10-CM | POA: Diagnosis not present

## 2014-09-27 DIAGNOSIS — K589 Irritable bowel syndrome without diarrhea: Secondary | ICD-10-CM | POA: Diagnosis not present

## 2014-10-03 DIAGNOSIS — M1711 Unilateral primary osteoarthritis, right knee: Secondary | ICD-10-CM | POA: Diagnosis not present

## 2014-10-03 DIAGNOSIS — M1712 Unilateral primary osteoarthritis, left knee: Secondary | ICD-10-CM | POA: Diagnosis not present

## 2014-11-03 DIAGNOSIS — H903 Sensorineural hearing loss, bilateral: Secondary | ICD-10-CM | POA: Diagnosis not present

## 2014-11-28 DIAGNOSIS — M1712 Unilateral primary osteoarthritis, left knee: Secondary | ICD-10-CM | POA: Diagnosis not present

## 2014-11-28 DIAGNOSIS — M1711 Unilateral primary osteoarthritis, right knee: Secondary | ICD-10-CM | POA: Diagnosis not present

## 2014-12-13 DIAGNOSIS — L853 Xerosis cutis: Secondary | ICD-10-CM | POA: Diagnosis not present

## 2014-12-13 DIAGNOSIS — L821 Other seborrheic keratosis: Secondary | ICD-10-CM | POA: Diagnosis not present

## 2014-12-13 DIAGNOSIS — L219 Seborrheic dermatitis, unspecified: Secondary | ICD-10-CM | POA: Diagnosis not present

## 2014-12-13 DIAGNOSIS — D485 Neoplasm of uncertain behavior of skin: Secondary | ICD-10-CM | POA: Diagnosis not present

## 2014-12-13 DIAGNOSIS — L820 Inflamed seborrheic keratosis: Secondary | ICD-10-CM | POA: Diagnosis not present

## 2014-12-13 DIAGNOSIS — L814 Other melanin hyperpigmentation: Secondary | ICD-10-CM | POA: Diagnosis not present

## 2014-12-13 DIAGNOSIS — Z85828 Personal history of other malignant neoplasm of skin: Secondary | ICD-10-CM | POA: Diagnosis not present

## 2014-12-13 DIAGNOSIS — D1800 Hemangioma unspecified site: Secondary | ICD-10-CM | POA: Diagnosis not present

## 2014-12-13 DIAGNOSIS — L570 Actinic keratosis: Secondary | ICD-10-CM | POA: Diagnosis not present

## 2014-12-14 DIAGNOSIS — C44329 Squamous cell carcinoma of skin of other parts of face: Secondary | ICD-10-CM | POA: Diagnosis not present

## 2015-01-09 ENCOUNTER — Telehealth: Payer: Self-pay | Admitting: *Deleted

## 2015-01-09 NOTE — Telephone Encounter (Signed)
Patient has been taking "a yellow tablet" that was given to her by Dr. Posey Pronto.  Her legs are still jumping.  Please advise.

## 2015-01-09 NOTE — Telephone Encounter (Signed)
She scheduled to see me for follow-up visit on March 22. Please ask her to bring her prescriptions to that visit so we can decide the next step. She can move her appointment to be seen sooner, if she would like.  Bellamy Rubey K. Posey Pronto, DO

## 2015-01-09 NOTE — Telephone Encounter (Signed)
Called patient back and spoke with [name] (daughter).  She agreed with plan and may call back to get sooner appointment.

## 2015-01-17 ENCOUNTER — Encounter: Payer: Self-pay | Admitting: Neurology

## 2015-01-17 ENCOUNTER — Ambulatory Visit (INDEPENDENT_AMBULATORY_CARE_PROVIDER_SITE_OTHER): Payer: Medicare Other | Admitting: Neurology

## 2015-01-17 VITALS — BP 140/70 | HR 66 | Wt 149.5 lb

## 2015-01-17 DIAGNOSIS — G2581 Restless legs syndrome: Secondary | ICD-10-CM

## 2015-01-17 DIAGNOSIS — G609 Hereditary and idiopathic neuropathy, unspecified: Secondary | ICD-10-CM

## 2015-01-17 DIAGNOSIS — F0390 Unspecified dementia without behavioral disturbance: Secondary | ICD-10-CM | POA: Diagnosis not present

## 2015-01-17 MED ORDER — AMITRIPTYLINE HCL 10 MG PO TABS: 10.0000 mg | ORAL_TABLET | Freq: Every day | ORAL | Status: DC

## 2015-01-17 NOTE — Patient Instructions (Addendum)
Start amitriptyline 10mg  at bedtime Call me with an update in one month Return to clinic in 103-month

## 2015-01-17 NOTE — Progress Notes (Signed)
[facility]  Follow-up Visit   Date: 01/17/2015    Stacey Jefferson MRN: [id number] DOB: 31-Jan-1925   Interim History: Stacey Jefferson is a 81 y.o. right-handed Caucasian female with history of peripheral neuropathy, RLS, CN III palsy, and esphageal stricture, and GERD here for follow-up of RLS.  She is accompanied by her daughter.   History of present illness:   She was previously followed by Dr. Erling Jefferson who had been managing her RLS for the past 10 years and saw Dr. Krista Jefferson at [facility] in June 2014 for the same symptoms. Per Dr. Rhea Jefferson clinic note, patient has had a normal EMG in 2001. She has was on Requip and then started Mirapex in 2001. In June 2014, she was taking mirapex 0.5mg  TID, sinemet 25/100 qhs, and clonazepam 0.5 mg once at night without significant change in her symptoms.  She complains of involuntary movements of the legs when at rest. For instance, she is at church her legs will start jumping. Mirapex was stopped in the summer because she was complaining of excessive day-time sleepiness.  - Follow-up 08/24/2013:  Since stopping Mirapex, daytime sleepiness improved. She has sinemet 25/100 at bedtime and clonazepam 0.5mg  which seems to control her RLS symptoms.  She currently lives alone and has an aide that comes.  - Follow-up 01/13/2014:  She reports having improvement of nightmares after taking Aricept in the morning. RLS symptoms continue to fluctuate. She had one interval hospitalization in November for diarrhea and dehydration. Since then, her daughter, Stacey Jefferson, has been living with patient.  She is able to dress and cook small, simple meals for herself.    - Follow-up 07/19/2014: She reports having gradual decline is overall health. She is now giving herself sponge bath in the bed because she is too unsteady to be in the bathroom and does not want her daughter to help.  She is complaining of swollen legs, knees pain, and numbness of her hands.  She is getting home  health 3 days per week. No significant worsening of RLS or memory.  - UPDATE 01/17/2015:  She talks a about non-neurological symptoms (knee pain, hip pain, "not feeling well").  Complains of right leg movement, described as jerking involuntarily which has been ongoing issue for many years. It is more noticeable at rest, such as when at church.  She did not tolerance sinemet during the day because of increased sedation.  She gets help from her daughter to bath and is able to dress herself, but may take a long time.     Medications:  Current Outpatient Prescriptions on File Prior to Visit  Medication Sig Dispense Refill  . acetaminophen (TYLENOL) 160 MG/5ML liquid Take by mouth every 4 (four) hours as needed for fever.    . carbidopa-levodopa (SINEMET) 25-100 MG per tablet Take 1 tablet by mouth 3 (three) times daily. 90 tablet 3  . clonazePAM (KLONOPIN) 0.5 MG tablet Take 1 tablet (0.5 mg total) by mouth at bedtime as needed. 90 tablet 3  . Cyanocobalamin (VITAMIN B 12 PO) Take by mouth as directed.    . diphenoxylate-atropine (LOMOTIL) 2.5-0.025 MG per tablet Take by mouth 3 (three) times daily as needed for diarrhea or loose stools.    . donepezil (ARICEPT) 10 MG tablet Take 1 tablet (10 mg total) by mouth daily. 90 tablet 3  . fluconazole (DIFLUCAN) 40 MG/ML suspension Take 5 mLs (200 mg total) by mouth daily. 35 mL 0  . omeprazole (PRILOSEC) 2 mg/mL SUSP Take  20 mg by mouth daily.    . Salicylic Acid (SALACYN) 6 % LOTN Apply topically as directed.     No current facility-administered medications on file prior to visit.    Allergies:  Allergies  Allergen Reactions  . Sulfa Antibiotics      Review of Systems:  CONSTITUTIONAL: No fevers, chills, night sweats, +weight loss.   EYES: No visual changes or eye pain ENT: No hearing changes.  No history of nose bleeds.   RESPIRATORY: No cough, wheezing and shortness of breath.   CARDIOVASCULAR: Negative for chest pain, and palpitations.    GI: Negative for abdominal discomfort, blood in stools or black stools.  No recent change in bowel habits.   GU:  No history of incontinence.   MUSCLOSKELETAL:+ joint pain or swelling.  No myalgias.   SKIN: Negative for lesions, rash, and itching.   HEMATOLOGY/ONCOLOGY: Negative for prolonged bleeding, bruising easily, and swollen nodes.   ENDOCRINE: Negative for cold or heat intolerance, polydipsia or goiter.   PSYCH:  No depression or anxiety symptoms.   NEURO: As Above.   Vital Signs:  BP 140/70 mmHg  Pulse 66  Wt 149 lb 8 oz (67.813 kg)  SpO2 97%  Neurological Exam:  MENTAL STATUS including orientation to time, place, person, recent and remote memory, attention span and concentration, language, and fund of knowledge is fair. Tangential thought content and difficult to direct.  Speech is not dysarthric.   CRANIAL NERVES: Pupils are round and reactive to light, left cranial nerve III palsy on left side with limited medial gaze, downward gaze, and upgaze (old). There is moderatel left eye ptosis. Extraocular muscles of the right eye is intact.    MOTOR: Mild generalized atrophy. Motor strength is antigravity throughout.  Tone is normal.  She sits with her right leg crossed over the left, but reports this is involuntary.    MSRs:  Right      Left  brachioradialis  1+   brachioradialis  1+   biceps  1+   biceps  1+   triceps  1+   triceps  1+   patellar  0   patellar  0   ankle jerk  0   ankle jerk  0    SENSORY:  Reduced vibration at the knees and hands bilaterally.  COORDINATION/GAIT: Movements are slowed throughout.   DATA: Labs 07/20/2013:  Copper 145, ceruloplasmin 37, fT3 2.7, fT4 0.83, MMA 0.18, vitamin B12 1480, HbA1c 5.3, ferritin 98.6, TSH 1.23   IMPRESSION/PLAN:  1.  Restless leg syndrome; clinically unchanged   - Previously tried mirapex, requip, neurontin, Lyrica, but stopped during summer 2014 due to excessive sleepiness  - Reduce sinemet 25/100mg  one tablet at  bedtime  - Continue clonazepam 0.5mg  at bedtime  - Trial of amitriptyline 10mg  at bedtime, uptitrate slowly if tolerating  2.  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  Dementia   - Continue Aricept 10mg  given, given hallucinations, will not increase the medication further  - She has developed skin rash to exelon patch  4.  Generalized deconditioning  5.  Return to clinic in 32-months  The duration of this appointment visit was 30 minutes of face-to-face time with the patient.  Greater than 50% of this time was spent in counseling, explanation of diagnosis, planning of further management, and coordination of  care.   Thank you for allowing me to participate in patient's care.  If I can answer any additional questions, I would be pleased to do so.    Sincerely,    Syre Knerr K. Posey Pronto, DO

## 2015-01-18 ENCOUNTER — Telehealth: Payer: Self-pay | Admitting: Neurology

## 2015-01-18 NOTE — Telephone Encounter (Signed)
Patient should continue her other medications and add the amitriptyline as instructed.  Stacey Jefferson K. Posey Pronto, DO

## 2015-01-18 NOTE — Telephone Encounter (Signed)
Can encounter be closed?

## 2015-01-18 NOTE — Telephone Encounter (Signed)
Per patient instructions looks like she only started amitriptyline, please advise

## 2015-01-18 NOTE — Telephone Encounter (Signed)
Pt was started on new medication yesterday. Pt's daughter, [name], has called to ask if pt should stop any of her other meds. Please call with clarification. CB# [phone] / Sherri S.

## 2015-01-18 NOTE — Telephone Encounter (Signed)
Patient notified

## 2015-01-23 DIAGNOSIS — C44329 Squamous cell carcinoma of skin of other parts of face: Secondary | ICD-10-CM | POA: Diagnosis not present

## 2015-02-07 DIAGNOSIS — Z1389 Encounter for screening for other disorder: Secondary | ICD-10-CM | POA: Diagnosis not present

## 2015-02-07 DIAGNOSIS — K222 Esophageal obstruction: Secondary | ICD-10-CM | POA: Diagnosis not present

## 2015-02-07 DIAGNOSIS — Z0000 Encounter for general adult medical examination without abnormal findings: Secondary | ICD-10-CM | POA: Diagnosis not present

## 2015-02-07 DIAGNOSIS — Z23 Encounter for immunization: Secondary | ICD-10-CM | POA: Diagnosis not present

## 2015-02-07 DIAGNOSIS — F324 Major depressive disorder, single episode, in partial remission: Secondary | ICD-10-CM | POA: Diagnosis not present

## 2015-02-07 DIAGNOSIS — G301 Alzheimer's disease with late onset: Secondary | ICD-10-CM | POA: Diagnosis not present

## 2015-02-07 DIAGNOSIS — G629 Polyneuropathy, unspecified: Secondary | ICD-10-CM | POA: Diagnosis not present

## 2015-02-07 DIAGNOSIS — I10 Essential (primary) hypertension: Secondary | ICD-10-CM | POA: Diagnosis not present

## 2015-02-07 DIAGNOSIS — G2581 Restless legs syndrome: Secondary | ICD-10-CM | POA: Diagnosis not present

## 2015-02-07 DIAGNOSIS — N183 Chronic kidney disease, stage 3 (moderate): Secondary | ICD-10-CM | POA: Diagnosis not present

## 2015-02-08 DIAGNOSIS — H4903 Third [oculomotor] nerve palsy, bilateral: Secondary | ICD-10-CM | POA: Diagnosis not present

## 2015-02-08 DIAGNOSIS — H04123 Dry eye syndrome of bilateral lacrimal glands: Secondary | ICD-10-CM | POA: Diagnosis not present

## 2015-02-08 DIAGNOSIS — H1851 Endothelial corneal dystrophy: Secondary | ICD-10-CM | POA: Diagnosis not present

## 2015-02-08 DIAGNOSIS — Z961 Presence of intraocular lens: Secondary | ICD-10-CM | POA: Diagnosis not present

## 2015-02-09 ENCOUNTER — Telehealth: Payer: Self-pay | Admitting: Neurology

## 2015-02-09 NOTE — Telephone Encounter (Signed)
Pt needs refill on donepezil 90 pills called into [facility] [phone] pt phone number is [phone]

## 2015-02-09 NOTE — Telephone Encounter (Signed)
Rx faxed

## 2015-02-10 DIAGNOSIS — H4900 Third [oculomotor] nerve palsy, unspecified eye: Secondary | ICD-10-CM | POA: Diagnosis not present

## 2015-02-10 DIAGNOSIS — Z79899 Other long term (current) drug therapy: Secondary | ICD-10-CM | POA: Diagnosis not present

## 2015-02-10 DIAGNOSIS — K219 Gastro-esophageal reflux disease without esophagitis: Secondary | ICD-10-CM | POA: Diagnosis not present

## 2015-02-10 DIAGNOSIS — R1310 Dysphagia, unspecified: Secondary | ICD-10-CM | POA: Diagnosis not present

## 2015-02-10 DIAGNOSIS — Z882 Allergy status to sulfonamides status: Secondary | ICD-10-CM | POA: Diagnosis not present

## 2015-02-10 DIAGNOSIS — Z87891 Personal history of nicotine dependence: Secondary | ICD-10-CM | POA: Diagnosis not present

## 2015-02-10 DIAGNOSIS — H1851 Endothelial corneal dystrophy: Secondary | ICD-10-CM | POA: Diagnosis not present

## 2015-02-10 DIAGNOSIS — B3781 Candidal esophagitis: Secondary | ICD-10-CM | POA: Diagnosis not present

## 2015-02-10 DIAGNOSIS — K222 Esophageal obstruction: Secondary | ICD-10-CM | POA: Diagnosis not present

## 2015-02-10 DIAGNOSIS — G2581 Restless legs syndrome: Secondary | ICD-10-CM | POA: Diagnosis not present

## 2015-02-10 DIAGNOSIS — F0390 Unspecified dementia without behavioral disturbance: Secondary | ICD-10-CM | POA: Diagnosis not present

## 2015-02-10 DIAGNOSIS — I10 Essential (primary) hypertension: Secondary | ICD-10-CM | POA: Diagnosis not present

## 2015-02-10 DIAGNOSIS — Z961 Presence of intraocular lens: Secondary | ICD-10-CM | POA: Diagnosis not present

## 2015-02-14 DIAGNOSIS — L570 Actinic keratosis: Secondary | ICD-10-CM | POA: Diagnosis not present

## 2015-02-14 DIAGNOSIS — L89321 Pressure ulcer of left buttock, stage 1: Secondary | ICD-10-CM | POA: Diagnosis not present

## 2015-02-27 DIAGNOSIS — M25512 Pain in left shoulder: Secondary | ICD-10-CM | POA: Diagnosis not present

## 2015-02-27 DIAGNOSIS — M170 Bilateral primary osteoarthritis of knee: Secondary | ICD-10-CM | POA: Diagnosis not present

## 2015-02-27 DIAGNOSIS — M25511 Pain in right shoulder: Secondary | ICD-10-CM | POA: Diagnosis not present

## 2015-04-06 DIAGNOSIS — G2581 Restless legs syndrome: Secondary | ICD-10-CM | POA: Diagnosis not present

## 2015-04-06 DIAGNOSIS — Z79899 Other long term (current) drug therapy: Secondary | ICD-10-CM | POA: Diagnosis not present

## 2015-04-06 DIAGNOSIS — N183 Chronic kidney disease, stage 3 (moderate): Secondary | ICD-10-CM | POA: Diagnosis not present

## 2015-04-06 DIAGNOSIS — M25561 Pain in right knee: Secondary | ICD-10-CM | POA: Diagnosis not present

## 2015-04-06 DIAGNOSIS — I10 Essential (primary) hypertension: Secondary | ICD-10-CM | POA: Diagnosis not present

## 2015-04-07 ENCOUNTER — Telehealth: Payer: Self-pay | Admitting: Neurology

## 2015-04-07 ENCOUNTER — Other Ambulatory Visit: Payer: Self-pay | Admitting: *Deleted

## 2015-04-07 MED ORDER — CARBIDOPA-LEVODOPA 25-100 MG PO TABS: 1.0000 | ORAL_TABLET | Freq: Three times a day (TID) | ORAL | Status: DC

## 2015-04-07 NOTE — Telephone Encounter (Signed)
Rx sent 

## 2015-04-07 NOTE — Telephone Encounter (Signed)
Daughter, nancy, called. Pt needs refill on Sinemet. Use [facility] mail order pharmacy. Please send 90 day supply. Call [name] with any questions, CB# [phone] / Gayleen Orem,

## 2015-04-13 DIAGNOSIS — K222 Esophageal obstruction: Secondary | ICD-10-CM | POA: Diagnosis not present

## 2015-04-13 DIAGNOSIS — R159 Full incontinence of feces: Secondary | ICD-10-CM | POA: Diagnosis not present

## 2015-04-13 DIAGNOSIS — R197 Diarrhea, unspecified: Secondary | ICD-10-CM | POA: Diagnosis not present

## 2015-04-14 ENCOUNTER — Other Ambulatory Visit: Payer: Self-pay | Admitting: Surgical

## 2015-04-17 DIAGNOSIS — R197 Diarrhea, unspecified: Secondary | ICD-10-CM | POA: Diagnosis not present

## 2015-04-17 DIAGNOSIS — K222 Esophageal obstruction: Secondary | ICD-10-CM | POA: Diagnosis not present

## 2015-04-20 DIAGNOSIS — M1711 Unilateral primary osteoarthritis, right knee: Secondary | ICD-10-CM | POA: Diagnosis not present

## 2015-04-27 NOTE — Patient Instructions (Addendum)
Stacey Jefferson  04/27/2015   Your procedure is scheduled on:    Wednesday  05/10/2015    Report to [facility] Main  Entrance take [facility]  elevators to 3rd floor to  [place] at        [place] AM.  Call this number if you have problems the morning of surgery [phone]   Remember: ONLY 1 PERSON MAY GO WITH YOU TO SHORT STAY TO GET  READY MORNING OF [place].  Do not eat food or drink liquids :After Midnight.     Take these medicines the morning of surgery with A SIP OF WATER: Tylenol liquid if needed                               You may not have any metal on your body including hair pins and              piercings  Do not wear jewelry, make-up, lotions, powders or perfumes, deodorant             Do not wear nail polish.  Do not shave  48 hours prior to surgery.                 Do not bring valuables to the hospital. [place].  Contacts, dentures or bridgework may not be worn into surgery.  Leave suitcase in the car. After surgery it may be brought to your room.        Special Instructions: coughing and deep breathing exercises, leg exercises              Please read over the following fact sheets you were given: _____________________________________________________________________             [facility] - Preparing for Surgery Before surgery, you can play an important role.  Because skin is not sterile, your skin needs to be as free of germs as possible.  You can reduce the number of germs on your skin by washing with CHG (chlorahexidine gluconate) soap before surgery.  CHG is an antiseptic cleaner which kills germs and bonds with the skin to continue killing germs even after washing. Please DO NOT use if you have an allergy to CHG or antibacterial soaps.  If your skin becomes reddened/irritated stop using the CHG and inform your nurse when you arrive at Short Stay. Do not shave (including legs and  underarms) for at least 48 hours prior to the first CHG shower.  You may shave your face/neck. Please follow these instructions carefully:  1.  Shower with CHG Soap the night before surgery and the  morning of Surgery.  2.  If you choose to wash your hair, wash your hair first as usual with your  normal  shampoo.  3.  After you shampoo, rinse your hair and body thoroughly to remove the  shampoo.                           4.  Use CHG as you would any other liquid soap.  You can apply chg directly  to the skin and wash  Gently with a scrungie or clean washcloth.  5.  Apply the CHG Soap to your body ONLY FROM THE NECK DOWN.   Do not use on face/ open                           Wound or open sores. Avoid contact with eyes, ears mouth and genitals (private parts).                       Wash face,  Genitals (private parts) with your normal soap.             6.  Wash thoroughly, paying special attention to the area where your surgery  will be performed.  7.  Thoroughly rinse your body with warm water from the neck down.  8.  DO NOT shower/wash with your normal soap after using and rinsing off  the CHG Soap.                9.  Pat yourself dry with a clean towel.            10.  Wear clean pajamas.            11.  Place clean sheets on your bed the night of your first shower and do not  sleep with pets. Day of Surgery : Do not apply any lotions/deodorants the morning of surgery.  Please wear clean clothes to the hospital/surgery center.  FAILURE TO FOLLOW THESE INSTRUCTIONS MAY RESULT IN THE CANCELLATION OF YOUR SURGERY PATIENT SIGNATURE_________________________________  NURSE SIGNATURE__________________________________  ________________________________________________________________________  WHAT IS A BLOOD TRANSFUSION? Blood Transfusion Information  A transfusion is the replacement of blood or some of its parts. Blood is made up of multiple cells which provide different  functions.  Red blood cells carry oxygen and are used for blood loss replacement.  White blood cells fight against infection.  Platelets control bleeding.  Plasma helps clot blood.  Other blood products are available for specialized needs, such as hemophilia or other clotting disorders. BEFORE THE TRANSFUSION  Who gives blood for transfusions?   Healthy volunteers who are fully evaluated to make sure their blood is safe. This is blood bank blood. Transfusion therapy is the safest it has ever been in the practice of medicine. Before blood is taken from a donor, a complete history is taken to make sure that person has no history of diseases nor engages in risky social behavior (examples are intravenous drug use or sexual activity with multiple partners). The donor's travel history is screened to minimize risk of transmitting infections, such as malaria. The donated blood is tested for signs of infectious diseases, such as HIV and hepatitis. The blood is then tested to be sure it is compatible with you in order to minimize the chance of a transfusion reaction. If you or a relative donates blood, this is often done in anticipation of surgery and is not appropriate for emergency situations. It takes many days to process the donated blood. RISKS AND COMPLICATIONS Although transfusion therapy is very safe and saves many lives, the main dangers of transfusion include:  1. Getting an infectious disease. 2. Developing a transfusion reaction. This is an allergic reaction to something in the blood you were given. Every precaution is taken to prevent this. The decision to have a blood transfusion has been considered carefully by your caregiver before blood is given. Blood is not given unless the benefits outweigh  the risks. AFTER THE TRANSFUSION  Right after receiving a blood transfusion, you will usually feel much better and more energetic. This is especially true if your red blood cells have gotten low  (anemic). The transfusion raises the level of the red blood cells which carry oxygen, and this usually causes an energy increase.  The nurse administering the transfusion will monitor you carefully for complications. HOME CARE INSTRUCTIONS  No special instructions are needed after a transfusion. You may find your energy is better. Speak with your caregiver about any limitations on activity for underlying diseases you may have. SEEK MEDICAL CARE IF:   Your condition is not improving after your transfusion.  You develop redness or irritation at the intravenous (IV) site. SEEK IMMEDIATE MEDICAL CARE IF:  Any of the following symptoms occur over the next 12 hours:  Shaking chills.  You have a temperature by mouth above 102 F (38.9 C), not controlled by medicine.  Chest, back, or muscle pain.  People around you feel you are not acting correctly or are confused.  Shortness of breath or difficulty breathing.  Dizziness and fainting.  You get a rash or develop hives.  You have a decrease in urine output.  Your urine turns a dark color or changes to pink, red, or brown. Any of the following symptoms occur over the next 10 days:  You have a temperature by mouth above 102 F (38.9 C), not controlled by medicine.  Shortness of breath.  Weakness after normal activity.  The white part of the eye turns yellow (jaundice).  You have a decrease in the amount of urine or are urinating less often.  Your urine turns a dark color or changes to pink, red, or brown. Document Released: 10/11/2000 Document Revised: 01/06/2012 Document Reviewed: 05/30/2008 [employer] Patient Information 2014 [place].  _______________________________________________________________________  Incentive Spirometer  An incentive spirometer is a tool that can help keep your lungs clear and active. This tool measures how well you are filling your lungs with each breath. Taking long deep breaths may help  reverse or decrease the chance of developing breathing (pulmonary) problems (especially infection) following:  A long period of time when you are unable to move or be active. BEFORE THE PROCEDURE   If the spirometer includes an indicator to show your best effort, your nurse or respiratory therapist will set it to a desired goal.  If possible, sit up straight or lean slightly forward. Try not to slouch.  Hold the incentive spirometer in an upright position. INSTRUCTIONS FOR USE  3. Sit on the edge of your bed if possible, or sit up as far as you can in bed or on a chair. 4. Hold the incentive spirometer in an upright position. 5. Breathe out normally. 6. Place the mouthpiece in your mouth and seal your lips tightly around it. 7. Breathe in slowly and as deeply as possible, raising the piston or the ball toward the top of the column. 8. Hold your breath for 3-5 seconds or for as long as possible. Allow the piston or ball to fall to the bottom of the column. 9. Remove the mouthpiece from your mouth and breathe out normally. 10. Rest for a few seconds and repeat Steps 1 through 7 at least 10 times every 1-2 hours when you are awake. Take your time and take a few normal breaths between deep breaths. 11. The spirometer may include an indicator to show your best effort. Use the indicator as a goal to work  toward during each repetition. 12. After each set of 10 deep breaths, practice coughing to be sure your lungs are clear. If you have an incision (the cut made at the time of surgery), support your incision when coughing by placing a pillow or rolled up towels firmly against it. Once you are able to get out of bed, walk around indoors and cough well. You may stop using the incentive spirometer when instructed by your caregiver.  RISKS AND COMPLICATIONS  Take your time so you do not get dizzy or light-headed.  If you are in pain, you may need to take or ask for pain medication before doing  incentive spirometry. It is harder to take a deep breath if you are having pain. AFTER USE  Rest and breathe slowly and easily.  It can be helpful to keep track of a log of your progress. Your caregiver can provide you with a simple table to help with this. If you are using the spirometer at home, follow these instructions: [place] IF:   You are having difficultly using the spirometer.  You have trouble using the spirometer as often as instructed.  Your pain medication is not giving enough relief while using the spirometer.  You develop fever of 100.5 F (38.1 C) or higher. SEEK IMMEDIATE MEDICAL CARE IF:   You cough up bloody sputum that had not been present before.  You develop fever of 102 F (38.9 C) or greater.  You develop worsening pain at or near the incision site. MAKE SURE YOU:   Understand these instructions.  Will watch your condition.  Will get help right away if you are not doing well or get worse. Document Released: 02/24/2007 Document Revised: 01/06/2012 Document Reviewed: 04/27/2007 [facility] Patient Information 2014 [place], [place].   ________________________________________________________________________

## 2015-05-02 ENCOUNTER — Encounter (HOSPITAL_COMMUNITY): Payer: Self-pay

## 2015-05-02 ENCOUNTER — Ambulatory Visit (HOSPITAL_COMMUNITY)
Admission: RE | Admit: 2015-05-02 | Discharge: 2015-05-02 | Disposition: A | Discharge status: Home / Self Care | Payer: Medicare Other | Source: Ambulatory Visit | Attending: Surgical | Admitting: Surgical

## 2015-05-02 ENCOUNTER — Encounter (HOSPITAL_COMMUNITY)
Admission: RE | Admit: 2015-05-02 | Discharge: 2015-05-02 | Disposition: A | Discharge status: Home / Self Care | Payer: Medicare Other | Source: Ambulatory Visit | Attending: Orthopedic Surgery | Admitting: Orthopedic Surgery

## 2015-05-02 DIAGNOSIS — Z01818 Encounter for other preprocedural examination: Secondary | ICD-10-CM | POA: Diagnosis not present

## 2015-05-02 DIAGNOSIS — M1711 Unilateral primary osteoarthritis, right knee: Secondary | ICD-10-CM | POA: Diagnosis not present

## 2015-05-02 DIAGNOSIS — Z87891 Personal history of nicotine dependence: Secondary | ICD-10-CM | POA: Diagnosis not present

## 2015-05-02 DIAGNOSIS — J449 Chronic obstructive pulmonary disease, unspecified: Secondary | ICD-10-CM | POA: Diagnosis not present

## 2015-05-02 HISTORY — DX: Malignant (primary) neoplasm, unspecified: C80.1

## 2015-05-02 HISTORY — DX: Gastro-esophageal reflux disease without esophagitis: K21.9

## 2015-05-02 HISTORY — DX: Essential (primary) hypertension: I10

## 2015-05-02 HISTORY — DX: Unspecified osteoarthritis, unspecified site: M19.90

## 2015-05-02 LAB — COMPREHENSIVE METABOLIC PANEL
ALT: 7 U/L — ABNORMAL LOW (ref 14–54)
AST: 14 U/L — ABNORMAL LOW (ref 15–41)
Albumin: 3.9 g/dL (ref 3.5–5.0)
Alkaline Phosphatase: 52 U/L (ref 38–126)
Anion gap: 6 (ref 5–15)
BUN: 12 mg/dL (ref 6–20)
CO2: 30 mmol/L (ref 22–32)
Calcium: 9.1 mg/dL (ref 8.9–10.3)
Chloride: 105 mmol/L (ref 101–111)
Creatinine, Ser: 0.76 mg/dL (ref 0.44–1.00)
GFR calc Af Amer: >60 mL/min (ref >60)
GFR calc non Af Amer: >60 mL/min (ref >60)
Glucose, Bld: 128 mg/dL — ABNORMAL HIGH (ref 65–99)
Potassium: 3.6 mmol/L (ref 3.5–5.1)
Sodium: 141 mmol/L (ref 135–145)
Total Bilirubin: 0.7 mg/dL (ref 0.3–1.2)
Total Protein: 6.9 g/dL (ref 6.5–8.1)

## 2015-05-02 LAB — PROTIME-INR
INR: 1.1 (ref 0.00–1.49)
Prothrombin Time: 14.4 seconds (ref 11.6–15.2)

## 2015-05-02 LAB — CBC WITH DIFFERENTIAL/PLATELET
Basophils Absolute: 0 10*3/uL (ref 0.0–0.1)
Basophils Relative: 1 % (ref 0–1)
Eosinophils Absolute: 0.1 10*3/uL (ref 0.0–0.7)
Eosinophils Relative: 2 % (ref 0–5)
HCT: 38.8 % (ref 36.0–46.0)
Hemoglobin: 12.9 g/dL (ref 12.0–15.0)
Lymphocytes Relative: 45 % (ref 12–46)
Lymphs Abs: 2.6 10*3/uL (ref 0.7–4.0)
MCH: 31.4 pg (ref 26.0–34.0)
MCHC: 33.2 g/dL (ref 30.0–36.0)
MCV: 94.4 fL (ref 78.0–100.0)
Monocytes Absolute: 0.3 10*3/uL (ref 0.1–1.0)
Monocytes Relative: 4 % (ref 3–12)
Neutro Abs: 2.8 10*3/uL (ref 1.7–7.7)
Neutrophils Relative %: 48 % (ref 43–77)
Platelets: 145 10*3/uL — ABNORMAL LOW (ref 150–400)
RBC: 4.11 MIL/uL (ref 3.87–5.11)
RDW: 12.9 % (ref 11.5–15.5)
WBC: 5.8 10*3/uL (ref 4.0–10.5)

## 2015-05-02 LAB — SURGICAL PCR SCREEN
MRSA, PCR: NEGATIVE
Staphylococcus aureus: NEGATIVE

## 2015-05-02 LAB — APTT: aPTT: 32 seconds (ref 24–37)

## 2015-05-02 LAB — ABO/RH: ABO/RH(D): B POS

## 2015-05-02 NOTE — Progress Notes (Signed)
Patient is unable to do get urine specimen as she is incontinent of urine all the time and wears a pad and diapers and does not know when she voids. She will need a urine specimen via cath am of surgery and needs order put in EPIC please.

## 2015-05-03 NOTE — H&P (Signed)
TOTAL KNEE ADMISSION H&P  Patient is being admitted for right total knee arthroplasty.  Subjective:  Chief Complaint:right knee pain.  HPI: Stacey Jefferson, 81 y.o. female, has a history of pain and functional disability in the right knee due to arthritis and has failed non-surgical conservative treatments for greater than 12 weeks to includeNSAID's and/or analgesics, corticosteriod injections, viscosupplementation injections, use of assistive devices and activity modification.  Onset of symptoms was gradual, starting >10 years ago with gradually worsening course since that time. The patient noted prior procedures on the knee to include  arthroscopy and menisectomy on the right knee(s).  Patient currently rates pain in the right knee(s) at 8 out of 10 with activity. Patient has night pain, worsening of pain with activity and weight bearing, pain that interferes with activities of daily living, pain with passive range of motion, crepitus and joint swelling.  Patient has evidence of periarticular osteophytes and joint space narrowing by imaging studies.  There is no active infection.  Patient Active Problem List   Diagnosis Date Noted  . Ambulatory dysfunction 09/16/2013  . Diarrhea 09/16/2013  . Hypokalemia 09/16/2013  . AKI (acute kidney injury) 09/16/2013  . Mild cognitive impairment 04/23/2013  . Weight loss   . Vomiting   . Dysphagia, unspecified(787.20)   . Irritable bowel syndrome   . Carpal tunnel syndrome   . Abnormality of gait   . Headache   . Pain in limb    Past Medical History  Diagnosis Date  . Weight loss   . Vomiting   . Dysphagia, unspecified(787.20)   . Memory loss   . Hypotension, unspecified   . Irritable bowel syndrome   . Restless legs syndrome (RLS)   . Carpal tunnel syndrome   . Abnormality of gait   . Headache   . Pain in limb   . Polyneuropathy in other diseases classified elsewhere   . Hypertension   . GERD (gastroesophageal reflux disease)   .  Arthritis   . Cancer     skin cancer left cheek with Mohrs    Past Surgical History  Procedure Laterality Date  . Bladder aspiration    . Carpal tunnel release Right   . Squamous cell carcinoma excision    . Abdominal hysterectomy    . Eye surgery      bilateral cataract surgery with lens implants  . Mouth implants  1991      Current outpatient prescriptions:  .  acetaminophen (TYLENOL) 160 MG/5ML liquid, Take by mouth every 4 (four) hours as needed for fever., Disp: , Rfl:  .  bisoprolol (ZEBETA) 5 MG tablet, Take 5 mg by mouth at bedtime. , Disp: , Rfl:  .  carbidopa-levodopa (SINEMET) 25-100 MG per tablet, Take 1 tablet by mouth 3 (three) times daily. (Patient taking differently: Take 1 tablet by mouth at bedtime. ), Disp: 90 tablet, Rfl: 3 .  clonazePAM (KLONOPIN) 0.5 MG tablet, Take 1 tablet (0.5 mg total) by mouth at bedtime as needed. (Patient taking differently: Take 0.5 mg by mouth at bedtime. ), Disp: 90 tablet, Rfl: 3 .  Cyanocobalamin (VITAMIN B 12 PO), Take 1 tablet by mouth daily. , Disp: , Rfl:  .  donepezil (ARICEPT) 10 MG tablet, Take 1 tablet (10 mg total) by mouth daily. (Patient taking differently: Take 10 mg by mouth at bedtime. ), Disp: 90 tablet, Rfl: 3 .  loperamide (IMODIUM) 1 MG/5ML solution, Take 2 mg by mouth as needed for diarrhea or loose stools., Disp: ,  Rfl:  .  loratadine (CLARITIN) 10 MG tablet, Take 10 mg by mouth daily., Disp: , Rfl:  .  omeprazole (PRILOSEC) 20 MG capsule, Take 20 mg by mouth 2 (two) times daily. Opens capsule and mixes in with food or water, Disp: , Rfl:  .  Salicylic Acid (SALACYN) 6 % LOTN, Apply 1 application topically as needed (for legs). , Disp: , Rfl:    Allergies  Allergen Reactions  . Sulfa Antibiotics     History  Substance Use Topics  . Smoking status: Former Smoker    Types: Cigarettes  . Smokeless tobacco: Never Used     Comment: Quit 60 years ago.  . Alcohol Use: Yes     Comment: Rare glass of wine     Family History  Problem Relation Age of Onset  . Dementia Mother   . Stroke Father      Review of Systems  Constitutional: Negative.   HENT: Positive for hearing loss. Negative for congestion, ear discharge, ear pain, nosebleeds, sore throat and tinnitus.        Positive for dysphagia  Eyes: Negative.   Respiratory: Negative.  Negative for stridor.   Cardiovascular: Negative.   Gastrointestinal: Positive for heartburn, diarrhea and constipation. Negative for nausea, vomiting, abdominal pain, blood in stool and melena.  Genitourinary: Positive for frequency. Negative for dysuria, urgency, hematuria and flank pain.       Positive for incontinence  Musculoskeletal: Positive for myalgias, back pain and joint pain. Negative for falls and neck pain.       Right knee pain  Skin: Negative.   Neurological: Positive for tremors. Negative for dizziness, tingling, sensory change, speech change, focal weakness, seizures, loss of consciousness and headaches.  Endo/Heme/Allergies: Negative.   Psychiatric/Behavioral: Positive for memory loss. Negative for depression, suicidal ideas, hallucinations and substance abuse. The patient is nervous/anxious. The patient does not have insomnia.     Objective:  Physical Exam  Constitutional: She is oriented to person, place, and time. She appears well-developed and well-nourished. No distress.  HENT:  Head: Normocephalic and atraumatic.  Right Ear: External ear normal.  Left Ear: External ear normal.  Nose: Nose normal.  Mouth/Throat: Oropharynx is clear and moist.  Eyes: Conjunctivae are normal. Right eye exhibits normal extraocular motion. Left eye exhibits abnormal extraocular motion.  Neck: Normal range of motion. Neck supple.  Cardiovascular: Normal rate, regular rhythm, normal heart sounds and intact distal pulses.   No murmur heard. Respiratory: Effort normal and breath sounds normal. No respiratory distress. She has no wheezes.  GI: Soft. Bowel  sounds are normal. She exhibits no distension. There is no tenderness.  Musculoskeletal:       Right hip: Normal.       Left hip: Normal.       Right knee: She exhibits decreased range of motion and swelling. She exhibits no effusion and no erythema. Tenderness found. Medial joint line and lateral joint line tenderness noted.       Left knee: She exhibits decreased range of motion and swelling. She exhibits no effusion and no erythema. Tenderness found. Medial joint line and lateral joint line tenderness noted.  Neurological: She is alert and oriented to person, place, and time. She has normal strength and normal reflexes. She displays tremor. A cranial nerve deficit is present. No sensory deficit.  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 She is not diaphoretic. No erythema.  Psychiatric: She  has a normal mood and affect. Her behavior is normal.     Vitals  Weight: 143 lb Height: 61in Body Surface Area: 1.64 m Body Mass Index: 27.02 kg/m  Pulse: 72 (Regular)  BP: 122/82 (Sitting, Left Arm, Standard)  Imaging Review Plain radiographs demonstrate severe degenerative joint disease of the right knee(s). The overall alignment ismild varus. The bone quality appears to be fair for age and reported activity level.  Assessment/Plan:  End stage primary osteoarthritis, right knee   The patient history, physical examination, clinical judgment of the provider and imaging studies are consistent with end stage degenerative joint disease of the right knee(s) and total knee arthroplasty is deemed medically necessary. The treatment options including medical management, injection therapy arthroscopy and arthroplasty were discussed at length. The risks and benefits of total knee arthroplasty were presented and reviewed. The risks due to aseptic loosening, infection, stiffness, patella tracking problems, thromboembolic complications  and other imponderables were discussed. The patient acknowledged the explanation, agreed to proceed with the plan and consent was signed. Patient is being admitted for inpatient treatment for surgery, pain control, PT, OT, prophylactic antibiotics, VTE prophylaxis, progressive ambulation and ADL's and discharge planning. The patient is planning to be discharged home with home health services    Needs cortisone injection in left knee TXA IV PCP: Dr. Bufford Spikes, PA-C

## 2015-05-10 ENCOUNTER — Encounter (HOSPITAL_COMMUNITY): Payer: Self-pay | Admitting: Certified Registered Nurse Anesthetist

## 2015-05-10 ENCOUNTER — Inpatient Hospital Stay (HOSPITAL_COMMUNITY): Payer: Medicare Other | Admitting: Certified Registered Nurse Anesthetist

## 2015-05-10 ENCOUNTER — Inpatient Hospital Stay (HOSPITAL_COMMUNITY)
Admission: RE | Admit: 2015-05-10 | Discharge: 2015-05-12 | DRG: 470 | Disposition: A | Discharge status: Home Health | Payer: Medicare Other | Source: Ambulatory Visit | Attending: Orthopedic Surgery | Admitting: Orthopedic Surgery

## 2015-05-10 ENCOUNTER — Encounter (HOSPITAL_COMMUNITY)
Admission: RE | Disposition: A | Discharge status: Home Health | Payer: Self-pay | Source: Ambulatory Visit | Attending: Orthopedic Surgery

## 2015-05-10 DIAGNOSIS — K219 Gastro-esophageal reflux disease without esophagitis: Secondary | ICD-10-CM | POA: Diagnosis present

## 2015-05-10 DIAGNOSIS — R413 Other amnesia: Secondary | ICD-10-CM | POA: Diagnosis present

## 2015-05-10 DIAGNOSIS — M1711 Unilateral primary osteoarthritis, right knee: Secondary | ICD-10-CM | POA: Diagnosis present

## 2015-05-10 DIAGNOSIS — G2581 Restless legs syndrome: Secondary | ICD-10-CM | POA: Diagnosis present

## 2015-05-10 DIAGNOSIS — K589 Irritable bowel syndrome without diarrhea: Secondary | ICD-10-CM | POA: Diagnosis present

## 2015-05-10 DIAGNOSIS — I10 Essential (primary) hypertension: Secondary | ICD-10-CM | POA: Diagnosis present

## 2015-05-10 DIAGNOSIS — Z96659 Presence of unspecified artificial knee joint: Secondary | ICD-10-CM

## 2015-05-10 DIAGNOSIS — M170 Bilateral primary osteoarthritis of knee: Principal | ICD-10-CM | POA: Diagnosis present

## 2015-05-10 DIAGNOSIS — Z87891 Personal history of nicotine dependence: Secondary | ICD-10-CM | POA: Diagnosis not present

## 2015-05-10 DIAGNOSIS — Z85828 Personal history of other malignant neoplasm of skin: Secondary | ICD-10-CM

## 2015-05-10 DIAGNOSIS — G629 Polyneuropathy, unspecified: Secondary | ICD-10-CM | POA: Diagnosis present

## 2015-05-10 DIAGNOSIS — M179 Osteoarthritis of knee, unspecified: Secondary | ICD-10-CM | POA: Diagnosis not present

## 2015-05-10 DIAGNOSIS — Z882 Allergy status to sulfonamides status: Secondary | ICD-10-CM

## 2015-05-10 DIAGNOSIS — Z79899 Other long term (current) drug therapy: Secondary | ICD-10-CM | POA: Diagnosis not present

## 2015-05-10 DIAGNOSIS — Z90710 Acquired absence of both cervix and uterus: Secondary | ICD-10-CM

## 2015-05-10 HISTORY — PX: TOTAL KNEE ARTHROPLASTY: SHX125

## 2015-05-10 LAB — TYPE AND SCREEN
ABO/RH(D): B POS
Antibody Screen: NEGATIVE

## 2015-05-10 SURGERY — ARTHROPLASTY, KNEE, TOTAL
Anesthesia: General | Site: Knee | Laterality: Bilateral

## 2015-05-10 MED ORDER — BUPIVACAINE HCL (PF) 0.25 % IJ SOLN
INTRAMUSCULAR | Status: DC | PRN
  Administered 2015-05-10: 20 mL

## 2015-05-10 MED ORDER — ACETAMINOPHEN 325 MG PO TABS: 650.0000 mg | ORAL_TABLET | Freq: Four times a day (QID) | ORAL | Status: DC | PRN

## 2015-05-10 MED ORDER — METHYLPREDNISOLONE ACETATE 40 MG/ML IJ SUSP
INTRAMUSCULAR | Status: AC
  Filled 2015-05-10: qty 1

## 2015-05-10 MED ORDER — LACTATED RINGERS IV SOLN: INTRAVENOUS | Status: DC

## 2015-05-10 MED ORDER — ACETAMINOPHEN 10 MG/ML IV SOLN
INTRAVENOUS | Status: AC
  Filled 2015-05-10: qty 100

## 2015-05-10 MED ORDER — METHYLPREDNISOLONE ACETATE 40 MG/ML IJ SUSP
INTRAMUSCULAR | Status: DC | PRN
  Administered 2015-05-10: 09:00:00 via INTRAMUSCULAR

## 2015-05-10 MED ORDER — ETOMIDATE 2 MG/ML IV SOLN
INTRAVENOUS | Status: AC
  Filled 2015-05-10: qty 10

## 2015-05-10 MED ORDER — ACETAMINOPHEN 160 MG/5ML PO SOLN
650.0000 mg | Freq: Four times a day (QID) | ORAL | Status: DC | PRN
  Administered 2015-05-10 – 2015-05-12 (×6): 650 mg via ORAL
  Filled 2015-05-10 (×6): qty 20.3

## 2015-05-10 MED ORDER — PROPOFOL 10 MG/ML IV BOLUS
INTRAVENOUS | Status: AC
  Filled 2015-05-10: qty 20

## 2015-05-10 MED ORDER — METHOCARBAMOL 500 MG PO TABS
500.0000 mg | ORAL_TABLET | Freq: Four times a day (QID) | ORAL | Status: DC | PRN
  Administered 2015-05-10 – 2015-05-12 (×2): 500 mg via ORAL
  Filled 2015-05-10: qty 1

## 2015-05-10 MED ORDER — METHOCARBAMOL 1000 MG/10ML IJ SOLN
500.0000 mg | Freq: Four times a day (QID) | INTRAVENOUS | Status: DC | PRN
  Filled 2015-05-10: qty 5

## 2015-05-10 MED ORDER — BISACODYL 5 MG PO TBEC: 5.0000 mg | DELAYED_RELEASE_TABLET | Freq: Every day | ORAL | Status: DC | PRN

## 2015-05-10 MED ORDER — THROMBIN 5000 UNITS EX SOLR
OROMUCOSAL | Status: DC | PRN
  Administered 2015-05-10: 10:00:00 via TOPICAL

## 2015-05-10 MED ORDER — PHENOL 1.4 % MT LIQD: 1.0000 | OROMUCOSAL | Status: DC | PRN

## 2015-05-10 MED ORDER — LACTATED RINGERS IR SOLN
Status: DC | PRN
  Administered 2015-05-10: 1

## 2015-05-10 MED ORDER — OMEPRAZOLE 20 MG PO CPDR
20.0000 mg | DELAYED_RELEASE_CAPSULE | Freq: Two times a day (BID) | ORAL | Status: DC
  Administered 2015-05-10 – 2015-05-12 (×4): 20 mg via ORAL
  Filled 2015-05-10 (×6): qty 1

## 2015-05-10 MED ORDER — BUPIVACAINE HCL (PF) 0.25 % IJ SOLN
INTRAMUSCULAR | Status: AC
  Filled 2015-05-10: qty 30

## 2015-05-10 MED ORDER — PHENYLEPHRINE HCL 10 MG/ML IJ SOLN
INTRAMUSCULAR | Status: AC
  Filled 2015-05-10: qty 1

## 2015-05-10 MED ORDER — CHLORHEXIDINE GLUCONATE 4 % EX LIQD: 60.0000 mL | Freq: Once | CUTANEOUS | Status: DC

## 2015-05-10 MED ORDER — TRANEXAMIC ACID 1000 MG/10ML IV SOLN
1000.0000 mg | INTRAVENOUS | Status: AC
  Administered 2015-05-10: 1000 mg via INTRAVENOUS
  Filled 2015-05-10: qty 10

## 2015-05-10 MED ORDER — OXYCODONE-ACETAMINOPHEN 5-325 MG PO TABS: 2.0000 | ORAL_TABLET | ORAL | Status: DC | PRN

## 2015-05-10 MED ORDER — MENTHOL 3 MG MT LOZG: 1.0000 | LOZENGE | OROMUCOSAL | Status: DC | PRN

## 2015-05-10 MED ORDER — METHYLPREDNISOLONE ACETATE 40 MG/ML IJ SUSP
80.0000 mg | Freq: Once | INTRAMUSCULAR | Status: DC
  Filled 2015-05-10: qty 2

## 2015-05-10 MED ORDER — SODIUM CHLORIDE 0.9 % IV SOLN
10.0000 mg | INTRAVENOUS | Status: DC | PRN
  Administered 2015-05-10: 40 ug/min via INTRAVENOUS

## 2015-05-10 MED ORDER — STERILE WATER FOR IRRIGATION IR SOLN
Status: DC | PRN
  Administered 2015-05-10: 1500 mL

## 2015-05-10 MED ORDER — SODIUM CHLORIDE 0.9 % IJ SOLN
INTRAMUSCULAR | Status: AC
  Filled 2015-05-10: qty 10

## 2015-05-10 MED ORDER — BUPIVACAINE LIPOSOME 1.3 % IJ SUSP
20.0000 mL | Freq: Once | INTRAMUSCULAR | Status: AC
  Administered 2015-05-10: 20 mL
  Filled 2015-05-10: qty 20

## 2015-05-10 MED ORDER — FENTANYL CITRATE (PF) 100 MCG/2ML IJ SOLN
INTRAMUSCULAR | Status: DC | PRN
  Administered 2015-05-10: 50 ug via INTRAVENOUS
  Administered 2015-05-10 (×6): 25 ug via INTRAVENOUS

## 2015-05-10 MED ORDER — LACTATED RINGERS IV SOLN
INTRAVENOUS | Status: DC
  Administered 2015-05-10 (×2): via INTRAVENOUS

## 2015-05-10 MED ORDER — SODIUM CHLORIDE 0.9 % IR SOLN
Status: AC
  Filled 2015-05-10: qty 1

## 2015-05-10 MED ORDER — ONDANSETRON HCL 4 MG/2ML IJ SOLN
INTRAMUSCULAR | Status: AC
  Filled 2015-05-10: qty 2

## 2015-05-10 MED ORDER — CEFAZOLIN SODIUM-DEXTROSE 2-3 GM-% IV SOLR
INTRAVENOUS | Status: AC
  Filled 2015-05-10: qty 50

## 2015-05-10 MED ORDER — FLEET ENEMA 7-19 GM/118ML RE ENEM: 1.0000 | ENEMA | Freq: Once | RECTAL | Status: AC | PRN

## 2015-05-10 MED ORDER — ONDANSETRON HCL 4 MG/2ML IJ SOLN: 4.0000 mg | Freq: Four times a day (QID) | INTRAMUSCULAR | Status: DC | PRN

## 2015-05-10 MED ORDER — FERROUS SULFATE 325 (65 FE) MG PO TABS
325.0000 mg | ORAL_TABLET | Freq: Three times a day (TID) | ORAL | Status: DC
  Administered 2015-05-12: 325 mg via ORAL
  Filled 2015-05-10 (×8): qty 1

## 2015-05-10 MED ORDER — CEFAZOLIN SODIUM-DEXTROSE 2-3 GM-% IV SOLR
2.0000 g | INTRAVENOUS | Status: AC
  Administered 2015-05-10: 2 g via INTRAVENOUS

## 2015-05-10 MED ORDER — BISOPROLOL FUMARATE 5 MG PO TABS
5.0000 mg | ORAL_TABLET | Freq: Every day | ORAL | Status: DC
  Administered 2015-05-10 – 2015-05-11 (×2): 5 mg via ORAL
  Filled 2015-05-10 (×3): qty 1

## 2015-05-10 MED ORDER — RIVAROXABAN 10 MG PO TABS
10.0000 mg | ORAL_TABLET | Freq: Every day | ORAL | Status: DC
  Administered 2015-05-11 – 2015-05-12 (×2): 10 mg via ORAL
  Filled 2015-05-10 (×3): qty 1

## 2015-05-10 MED ORDER — SUCCINYLCHOLINE CHLORIDE 20 MG/ML IJ SOLN
INTRAMUSCULAR | Status: DC | PRN
  Administered 2015-05-10: 100 mg via INTRAVENOUS

## 2015-05-10 MED ORDER — ACETAMINOPHEN 650 MG RE SUPP: 650.0000 mg | Freq: Four times a day (QID) | RECTAL | Status: DC | PRN

## 2015-05-10 MED ORDER — HYDROMORPHONE HCL 1 MG/ML IJ SOLN: 0.2500 mg | INTRAMUSCULAR | Status: DC | PRN

## 2015-05-10 MED ORDER — LACTATED RINGERS IV SOLN
INTRAVENOUS | Status: DC
  Administered 2015-05-11: via INTRAVENOUS

## 2015-05-10 MED ORDER — CLONAZEPAM 0.5 MG PO TABS
0.5000 mg | ORAL_TABLET | Freq: Every day | ORAL | Status: DC
  Administered 2015-05-10 – 2015-05-11 (×2): 0.5 mg via ORAL
  Filled 2015-05-10 (×2): qty 1

## 2015-05-10 MED ORDER — HYDROMORPHONE HCL 1 MG/ML IJ SOLN: 0.5000 mg | INTRAMUSCULAR | Status: DC | PRN

## 2015-05-10 MED ORDER — DONEPEZIL HCL 10 MG PO TABS
10.0000 mg | ORAL_TABLET | Freq: Every day | ORAL | Status: DC
  Administered 2015-05-10 – 2015-05-11 (×2): 10 mg via ORAL
  Filled 2015-05-10 (×3): qty 1

## 2015-05-10 MED ORDER — TRAMADOL HCL 50 MG PO TABS
50.0000 mg | ORAL_TABLET | Freq: Four times a day (QID) | ORAL | Status: DC | PRN
  Administered 2015-05-12: 100 mg via ORAL
  Filled 2015-05-10: qty 2

## 2015-05-10 MED ORDER — FENTANYL CITRATE (PF) 100 MCG/2ML IJ SOLN
INTRAMUSCULAR | Status: AC
  Filled 2015-05-10: qty 2

## 2015-05-10 MED ORDER — LIDOCAINE HCL (CARDIAC) 20 MG/ML IV SOLN
INTRAVENOUS | Status: AC
  Filled 2015-05-10: qty 5

## 2015-05-10 MED ORDER — DEXAMETHASONE SODIUM PHOSPHATE 10 MG/ML IJ SOLN
INTRAMUSCULAR | Status: AC
  Filled 2015-05-10: qty 1

## 2015-05-10 MED ORDER — CARBIDOPA-LEVODOPA 25-100 MG PO TABS
1.0000 | ORAL_TABLET | Freq: Every day | ORAL | Status: DC
  Administered 2015-05-10 – 2015-05-11 (×2): 1 via ORAL
  Filled 2015-05-10 (×3): qty 1

## 2015-05-10 MED ORDER — POLYMYXIN B SULFATE 500000 UNITS IJ SOLR
INTRAMUSCULAR | Status: DC | PRN
  Administered 2015-05-10: 500 mL

## 2015-05-10 MED ORDER — FENTANYL CITRATE (PF) 250 MCG/5ML IJ SOLN
INTRAMUSCULAR | Status: AC
  Filled 2015-05-10: qty 5

## 2015-05-10 MED ORDER — LOPERAMIDE HCL 1 MG/5ML PO LIQD
2.0000 mg | ORAL | Status: DC | PRN
  Administered 2015-05-11: 2 mg via ORAL
  Filled 2015-05-10: qty 10

## 2015-05-10 MED ORDER — ETOMIDATE 2 MG/ML IV SOLN
INTRAVENOUS | Status: DC | PRN
  Administered 2015-05-10: 8 mg via INTRAVENOUS

## 2015-05-10 MED ORDER — SODIUM CHLORIDE 0.9 % IJ SOLN
INTRAMUSCULAR | Status: DC | PRN
  Administered 2015-05-10: 20 mL

## 2015-05-10 MED ORDER — SODIUM CHLORIDE 0.9 % IJ SOLN
INTRAMUSCULAR | Status: AC
  Filled 2015-05-10: qty 50

## 2015-05-10 MED ORDER — EPHEDRINE SULFATE 50 MG/ML IJ SOLN
INTRAMUSCULAR | Status: AC
  Filled 2015-05-10: qty 1

## 2015-05-10 MED ORDER — PROPOFOL 10 MG/ML IV BOLUS
INTRAVENOUS | Status: DC | PRN
  Administered 2015-05-10: 20 mg via INTRAVENOUS
  Administered 2015-05-10: 80 mg via INTRAVENOUS

## 2015-05-10 MED ORDER — ACETAMINOPHEN 10 MG/ML IV SOLN
1000.0000 mg | Freq: Once | INTRAVENOUS | Status: AC
  Administered 2015-05-10: 1000 mg via INTRAVENOUS

## 2015-05-10 MED ORDER — ONDANSETRON HCL 4 MG PO TABS: 4.0000 mg | ORAL_TABLET | Freq: Four times a day (QID) | ORAL | Status: DC | PRN

## 2015-05-10 MED ORDER — PANTOPRAZOLE SODIUM 40 MG PO TBEC: 40.0000 mg | DELAYED_RELEASE_TABLET | Freq: Every day | ORAL | Status: DC

## 2015-05-10 MED ORDER — LIDOCAINE HCL (CARDIAC) 20 MG/ML IV SOLN
INTRAVENOUS | Status: DC | PRN
  Administered 2015-05-10: 50 mg via INTRAVENOUS

## 2015-05-10 MED ORDER — ONDANSETRON HCL 4 MG/2ML IJ SOLN
INTRAMUSCULAR | Status: DC | PRN
  Administered 2015-05-10 (×2): 2 mg via INTRAVENOUS

## 2015-05-10 MED ORDER — POLYETHYLENE GLYCOL 3350 17 G PO PACK: 17.0000 g | PACK | Freq: Every day | ORAL | Status: DC | PRN

## 2015-05-10 MED ORDER — CEFAZOLIN SODIUM 1-5 GM-% IV SOLN
1.0000 g | Freq: Four times a day (QID) | INTRAVENOUS | Status: AC
  Administered 2015-05-10 (×2): 1 g via INTRAVENOUS
  Filled 2015-05-10 (×2): qty 50

## 2015-05-10 MED ORDER — DEXAMETHASONE SODIUM PHOSPHATE 10 MG/ML IJ SOLN
INTRAMUSCULAR | Status: DC | PRN
  Administered 2015-05-10: 10 mg via INTRAVENOUS

## 2015-05-10 MED ORDER — HYDROCODONE-ACETAMINOPHEN 5-325 MG PO TABS: 1.0000 | ORAL_TABLET | ORAL | Status: DC | PRN

## 2015-05-10 MED ORDER — EPHEDRINE SULFATE 50 MG/ML IJ SOLN
INTRAMUSCULAR | Status: DC | PRN
  Administered 2015-05-10: 5 mg via INTRAVENOUS
  Administered 2015-05-10 (×3): 2.5 mg via INTRAVENOUS

## 2015-05-10 MED ORDER — ALUM & MAG HYDROXIDE-SIMETH 200-200-20 MG/5ML PO SUSP
30.0000 mL | ORAL | Status: DC | PRN
Start: 2015-05-10 — End: 2015-05-12

## 2015-05-10 MED ORDER — THROMBIN 5000 UNITS EX SOLR
CUTANEOUS | Status: AC
  Filled 2015-05-10: qty 10000

## 2015-05-10 SURGICAL SUPPLY — 79 items
BAG DECANTER FOR FLEXI CONT (MISCELLANEOUS) IMPLANT
BAG ZIPLOCK 12X15 (MISCELLANEOUS) IMPLANT
BANDAGE ELASTIC 4 VELCRO ST LF (GAUZE/BANDAGES/DRESSINGS) ×3 IMPLANT
BANDAGE ELASTIC 6 VELCRO ST LF (GAUZE/BANDAGES/DRESSINGS) ×3 IMPLANT
BANDAGE ESMARK 6X9 LF (GAUZE/BANDAGES/DRESSINGS) ×1 IMPLANT
BLADE SAG 18X100X1.27 (BLADE) ×3 IMPLANT
BLADE SAW SGTL 11.0X1.19X90.0M (BLADE) ×3 IMPLANT
BNDG ESMARK 6X9 LF (GAUZE/BANDAGES/DRESSINGS) ×3
BNDG GAUZE ELAST 4 BULKY (GAUZE/BANDAGES/DRESSINGS) ×3 IMPLANT
BONE CEMENT GENTAMICIN (Cement) ×6 IMPLANT
CAP KNEE TOTAL 3 SIGMA ×3 IMPLANT
CEMENT BONE GENTAMICIN 40 (Cement) ×2 IMPLANT
CLOSURE WOUND 1/2 X4 (GAUZE/BANDAGES/DRESSINGS) ×1
CUFF TOURN SGL QUICK 34 (TOURNIQUET CUFF) ×2
CUFF TRNQT CYL 34X4X40X1 (TOURNIQUET CUFF) ×1 IMPLANT
DECANTER SPIKE VIAL GLASS SM (MISCELLANEOUS) ×3 IMPLANT
DRAPE EXTREMITY T 121X128X90 (DRAPE) ×3 IMPLANT
DRAPE INCISE IOBAN 66X45 STRL (DRAPES) IMPLANT
DRAPE POUCH INSTRU U-SHP 10X18 (DRAPES) ×3 IMPLANT
DRAPE SHEET LG 3/4 BI-LAMINATE (DRAPES) ×3 IMPLANT
DRAPE U-SHAPE 47X51 STRL (DRAPES) ×3 IMPLANT
DRSG ADAPTIC 3X8 NADH LF (GAUZE/BANDAGES/DRESSINGS) ×3 IMPLANT
DRSG AQUACEL AG ADV 3.5X10 (GAUZE/BANDAGES/DRESSINGS) ×3 IMPLANT
DRSG PAD ABDOMINAL 8X10 ST (GAUZE/BANDAGES/DRESSINGS) ×3 IMPLANT
DRSG TEGADERM 2-3/8X2-3/4 SM (GAUZE/BANDAGES/DRESSINGS) ×3 IMPLANT
DRSG TEGADERM 4X4.75 (GAUZE/BANDAGES/DRESSINGS) ×3 IMPLANT
DURAPREP 26ML APPLICATOR (WOUND CARE) ×3 IMPLANT
ELECT REM PT RETURN 9FT ADLT (ELECTROSURGICAL) ×3
ELECTRODE REM PT RTRN 9FT ADLT (ELECTROSURGICAL) ×1 IMPLANT
EVACUATOR 1/8 PVC DRAIN (DRAIN) ×3 IMPLANT
FACESHIELD WRAPAROUND (MASK) ×15 IMPLANT
GAUZE SPONGE 2X2 8PLY STRL LF (GAUZE/BANDAGES/DRESSINGS) ×1 IMPLANT
GAUZE SPONGE 4X4 12PLY STRL (GAUZE/BANDAGES/DRESSINGS) ×3 IMPLANT
GLOVE BIOGEL PI IND STRL 6.5 (GLOVE) ×1 IMPLANT
GLOVE BIOGEL PI IND STRL 8 (GLOVE) ×1 IMPLANT
GLOVE BIOGEL PI INDICATOR 6.5 (GLOVE) ×2
GLOVE BIOGEL PI INDICATOR 8 (GLOVE) ×2
GLOVE ECLIPSE 8.0 STRL XLNG CF (GLOVE) ×6 IMPLANT
GLOVE SURG SS PI 6.5 STRL IVOR (GLOVE) ×3 IMPLANT
GOWN STRL REUS W/TWL LRG LVL3 (GOWN DISPOSABLE) ×3 IMPLANT
GOWN STRL REUS W/TWL XL LVL3 (GOWN DISPOSABLE) ×3 IMPLANT
HANDPIECE INTERPULSE COAX TIP (DISPOSABLE) ×2
IMMOBILIZER KNEE 20 (SOFTGOODS) ×6 IMPLANT
IMMOBILIZER KNEE 20 THIGH 36 (SOFTGOODS) ×1 IMPLANT
KIT BASIN OR (CUSTOM PROCEDURE TRAY) ×3 IMPLANT
LIQUID BAND (GAUZE/BANDAGES/DRESSINGS) ×3 IMPLANT
MANIFOLD NEPTUNE II (INSTRUMENTS) ×3 IMPLANT
NDL SAFETY ECLIPSE 18X1.5 (NEEDLE) ×2 IMPLANT
NEEDLE HYPO 18GX1.5 SHARP (NEEDLE) ×4
NEEDLE HYPO 22GX1.5 SAFETY (NEEDLE) ×3 IMPLANT
NS IRRIG 1000ML POUR BTL (IV SOLUTION) IMPLANT
PACK TOTAL JOINT (CUSTOM PROCEDURE TRAY) ×3 IMPLANT
PADDING CAST COTTON 6X4 STRL (CAST SUPPLIES) IMPLANT
PEN SKIN MARKING BROAD (MISCELLANEOUS) ×3 IMPLANT
POSITIONER SURGICAL ARM (MISCELLANEOUS) ×3 IMPLANT
SET HNDPC FAN SPRY TIP SCT (DISPOSABLE) ×1 IMPLANT
SET PAD KNEE POSITIONER (MISCELLANEOUS) ×3 IMPLANT
SPONGE GAUZE 2X2 STER 10/PKG (GAUZE/BANDAGES/DRESSINGS) ×2
SPONGE LAP 18X18 X RAY DECT (DISPOSABLE) IMPLANT
SPONGE SURGIFOAM ABS GEL 100 (HEMOSTASIS) ×3 IMPLANT
STAPLER VISISTAT 35W (STAPLE) IMPLANT
STRIP CLOSURE SKIN 1/2X4 (GAUZE/BANDAGES/DRESSINGS) ×2 IMPLANT
SUCTION FRAZIER 12FR DISP (SUCTIONS) ×3 IMPLANT
SUT BONE WAX W31G (SUTURE) ×3 IMPLANT
SUT MNCRL AB 4-0 PS2 18 (SUTURE) ×3 IMPLANT
SUT VIC AB 1 CT1 27 (SUTURE) ×4
SUT VIC AB 1 CT1 27XBRD ANTBC (SUTURE) ×2 IMPLANT
SUT VIC AB 2-0 CT1 27 (SUTURE) ×6
SUT VIC AB 2-0 CT1 TAPERPNT 27 (SUTURE) ×3 IMPLANT
SUT VLOC 180 0 24IN GS25 (SUTURE) ×3 IMPLANT
SYR 20CC LL (SYRINGE) ×6 IMPLANT
SYR 50ML LL SCALE MARK (SYRINGE) ×3 IMPLANT
TOWEL OR 17X26 10 PK STRL BLUE (TOWEL DISPOSABLE) ×3 IMPLANT
TOWEL OR NON WOVEN STRL DISP B (DISPOSABLE) IMPLANT
TOWER CARTRIDGE SMART MIX (DISPOSABLE) ×3 IMPLANT
TRAY FOLEY W/METER SILVER 14FR (SET/KITS/TRAYS/PACK) ×3 IMPLANT
WATER STERILE IRR 1500ML POUR (IV SOLUTION) ×3 IMPLANT
WRAP KNEE MAXI GEL POST OP (GAUZE/BANDAGES/DRESSINGS) ×3 IMPLANT
YANKAUER SUCT BULB TIP 10FT TU (MISCELLANEOUS) ×3 IMPLANT

## 2015-05-10 NOTE — Interval H&P Note (Signed)
History and Physical Interval Note:  05/10/2015 8:10 AM  Stacey Jefferson  has presented today for surgery, with the diagnosis of OA RIGHT KNEE   The various methods of treatment have been discussed with the patient and family. After consideration of risks, benefits and other options for treatment, the patient has consented to  Procedure(s): TOTAL RIGHT  KNEE ARTHROPLASTY (Right) as a surgical intervention .  The patient's history has been reviewed, patient examined, no change in status, stable for surgery.  I have reviewed the patient's chart and labs.  Questions were answered to the patient's satisfaction.     Marrion Finan A

## 2015-05-10 NOTE — Anesthesia Procedure Notes (Signed)
Procedure Name: Intubation Date/Time: 05/10/2015 8:44 AM Performed by: Ofilia Neas Pre-anesthesia Checklist: Patient identified, Timeout performed, Emergency Drugs available, Suction available and Patient being monitored Patient Re-evaluated:Patient Re-evaluated prior to inductionOxygen Delivery Method: Circle system utilized Preoxygenation: Pre-oxygenation with 100% oxygen Intubation Type: IV induction Ventilation: Mask ventilation without difficulty Laryngoscope Size: Mac and 4 Grade View: Grade I Tube type: Oral Tube size: 7.5 mm Number of attempts: 1 Airway Equipment and Method: Stylet Placement Confirmation: ETT inserted through vocal cords under direct vision,  positive ETCO2 and breath sounds checked- equal and bilateral Secured at: 21 cm Tube secured with: Tape (paper tape) Dental Injury: Teeth and Oropharynx as per pre-operative assessment  Comments: Cords visualized with one pillow and donut under head

## 2015-05-10 NOTE — Anesthesia Preprocedure Evaluation (Addendum)
Anesthesia Evaluation  Patient identified by MRN, date of birth, ID band Patient awake    Reviewed: Allergy & Precautions, H&P , NPO status , Patient's Chart, lab work & pertinent test results  Airway Mallampati: II  TM Distance: >3 FB Neck ROM: full    Dental  (+) Dental Advisory Given, Edentulous Upper, Edentulous Lower   Pulmonary neg pulmonary ROS, former smoker,  breath sounds clear to auscultation  Pulmonary exam normal       Cardiovascular Exercise Tolerance: Good hypertension, Pt. on home beta blockers Normal cardiovascular examRhythm:regular Rate:Normal     Neuro/Psych Dementia - mild. Polyneuropathy.  Neuromuscular disease negative neurological ROS  negative psych ROS   GI/Hepatic negative GI ROS, Neg liver ROS, GERD-  Controlled and Medicated,  Endo/Other  negative endocrine ROS  Renal/GU negative Renal ROS  negative genitourinary   Musculoskeletal   Abdominal   Peds  Hematology negative hematology ROS (+)   Anesthesia Other Findings   Reproductive/Obstetrics negative OB ROS                            Anesthesia Physical Anesthesia Plan  ASA: III  Anesthesia Plan: General   Post-op Pain Management:    Induction: Intravenous  Airway Management Planned: Oral ETT  Additional Equipment:   Intra-op Plan:   Post-operative Plan: Extubation in OR  Informed Consent: I have reviewed the patients History and Physical, chart, labs and discussed the procedure including the risks, benefits and alternatives for the proposed anesthesia with the patient or authorized representative who has indicated his/her understanding and acceptance.   Dental Advisory Given  Plan Discussed with: CRNA and Surgeon  Anesthesia Plan Comments:         Anesthesia Quick Evaluation

## 2015-05-10 NOTE — Evaluation (Signed)
Physical Therapy Evaluation Patient Details Name: Stacey Jefferson MRN: [id number] DOB: 1925/10/23 Today's Date: 05/10/2015   History of Present Illness  R TKR and injection L knee; hx of IBS, dysphagia, and polyneuropathy  Clinical Impression  Pt s/p R TKR presents with decreased R LE strength/ROM, post op pain, balance deficits associated with peripheral neuropathy and incontinence of bowel and bladder limiting functional mobility.  Pt and dtr hope to progress to dc home with assist of family and hired aides but understand that pt may need to consider follow up rehab at SNF level dependent on acute stay progress.    Follow Up Recommendations Home health PT;SNF    Equipment Recommendations  None recommended by PT    Recommendations for Other Services OT consult     Precautions / Restrictions Precautions Precautions: Knee;Fall Required Braces or Orthoses: Knee Immobilizer - Right Knee Immobilizer - Right: Discontinue once straight leg raise with < 10 degree lag Restrictions Weight Bearing Restrictions: No Other Position/Activity Restrictions: WBAT      Mobility  Bed Mobility Overal bed mobility: Needs Assistance;+2 for physical assistance;+ 2 for safety/equipment Bed Mobility: Supine to Sit;Sit to Supine     Supine to sit: Min assist;Mod assist;+2 for physical assistance;+2 for safety/equipment Sit to supine: Mod assist;+2 for physical assistance   General bed mobility comments: cues for sequence and use of L LE to self assist  Transfers Overall transfer level: Needs assistance Equipment used: Rolling walker (2 wheeled) Transfers: Sit to/from Stand Sit to Stand: Mod assist;+2 physical assistance;+2 safety/equipment         General transfer comment: cues for LE management and use of UEs to self assist  Ambulation/Gait Ambulation/Gait assistance: Mod assist;+2 physical assistance;+2 safety/equipment Ambulation Distance (Feet): 5 Feet (and 2') Assistive device: Rolling  walker (2 wheeled) Gait Pattern/deviations: Step-to pattern;Decreased step length - right;Decreased step length - left;Shuffle;Trunk flexed Gait velocity: decr Gait velocity interpretation: Below normal speed for age/gender General Gait Details: cues for sequence, posture and position from [employer]            Wheelchair Mobility    Modified Rankin (Stroke Patients Only)       Balance Overall balance assessment: Needs assistance Sitting-balance support: No upper extremity supported;Feet supported Sitting balance-Leahy Scale: Fair     Standing balance support: Bilateral upper extremity supported Standing balance-Leahy Scale: Poor                               Pertinent Vitals/Pain Pain Assessment: Faces Faces Pain Scale: Hurts little more Pain Location: R knee Pain Descriptors / Indicators: Sore Pain Intervention(s): Limited activity within patient's tolerance;Monitored during session;Premedicated before session;Ice applied    Home Living Family/patient expects to be discharged to:: Private residence Living Arrangements: Children Available Help at Discharge: Family Type of Home: House Home Access: [place]: One [place]: Environmental consultant - 2 wheels;Walker - 4 wheels      Prior Function Level of Independence: Needs assistance         Comments: 24/7 assisit of Dtr and hired attendents.     Hand Dominance        Extremity/Trunk Assessment   Upper Extremity Assessment: Generalized weakness           Lower Extremity Assessment: Generalized weakness;RLE deficits/detail      Cervical / Trunk Assessment: Kyphotic  Communication   Communication: HOH  Cognition Arousal/Alertness: Awake/alert Behavior  During Therapy: WFL for tasks assessed/performed Overall Cognitive Status: Within Functional Limits for tasks assessed                      General Comments      Exercises         Assessment/Plan    PT Assessment Patient needs continued PT services  PT Diagnosis Difficulty walking   PT Problem List Decreased strength;Decreased range of motion;Decreased activity tolerance;Decreased mobility;Decreased knowledge of use of DME;Pain;Decreased balance  PT Treatment Interventions DME instruction;Gait training;Stair training;Functional mobility training;Therapeutic activities;Therapeutic exercise;Patient/family education   PT Goals (Current goals can be found in the Care Plan section) Acute Rehab PT Goals Patient Stated Goal: HOME PT Goal Formulation: With patient/family Time For Goal Achievement: 05/17/15 Potential to Achieve Goals: Fair    Frequency 7X/week   Barriers to discharge        Co-evaluation               End of Session Equipment Utilized During Treatment: Gait belt;Right knee immobilizer Activity Tolerance: Patient tolerated treatment well;Other (comment) (ltd by onset incontinence of stool) Patient left: in bed;with call bell/phone within reach;with nursing/sitter in room;with family/visitor present Nurse Communication: Mobility status         Time: 7048-8891 PT Time Calculation (min) (ACUTE ONLY): 53 min   Charges:   PT Evaluation $Initial PT Evaluation Tier I: 1 Procedure PT Treatments $Gait Training: 8-22 mins $Therapeutic Activity: 8-22 mins   PT G Codes:        Alieu Finnigan 26-May-2015, 5:48 PM

## 2015-05-10 NOTE — Transfer of Care (Signed)
Immediate Anesthesia Transfer of Care Note  Patient: Stacey Jefferson  Procedure(s) Performed: Procedure(s): TOTAL RIGHT  KNEE ARTHROPLASTY ;   LEFT KNEE STEROID INJECTION  (Bilateral)  Patient Location: PACU  Anesthesia Type:General  Level of Consciousness: awake, oriented, patient cooperative, lethargic and responds to stimulation  Airway & Oxygen Therapy: Patient Spontanous Breathing and Patient connected to face mask oxygen  Post-op Assessment: Report given to RN, Post -op Vital signs reviewed and stable and Patient moving all extremities  Post vital signs: Reviewed and stable  Last Vitals:  Filed Vitals:   05/10/15 0636  BP: 153/81  Pulse: 56  Temp: 36.5 C  Resp: 16    Complications: No apparent anesthesia complications

## 2015-05-10 NOTE — Brief Op Note (Signed)
05/10/2015  10:37 AM  PATIENT:  Stacey Jefferson  81 y.o. female  PRE-OPERATIVE DIAGNOSIS:Primary  OA RIGHT KNEE with severe Contractures.Primary Osteoarthritis of Left Knee also.  POST-OPERATIVE DIAGNOSIS:  Same as Pre-Op   PROCEDURE:  Procedure(s): TOTAL RIGHT  KNEE ARTHROPLASTY ;   LEFT KNEE STEROID INJECTION  (Bilateral),and Release of Flexion Contractures.  SURGEON:  Surgeon(s) and Role:    * Latanya Maudlin, MD - Primary  PHYSICIAN ASSISTANT: Ardeen Jourdain PA  ASSISTANTS: Ardeen Jourdain PA   ANESTHESIA:   general  EBL:  Total I/O In: 1000 [I.V.:1000] Out: 575 [Urine:575]  BLOOD ADMINISTERED:none  DRAINS: (One) Hemovact drain(s) in the One with  Suction Open,Right Knee   LOCAL MEDICATIONS USED:  MARCAINE  20cc of 0.50% Plain and Exparel 20cc mixed with 20cc of Normal Saline.   SPECIMEN:  No Specimen  DISPOSITION OF SPECIMEN:  N/A  COUNTS:  YES  TOURNIQUET:  * Missing tourniquet times found for documented tourniquets in log:  [id number] *  DICTATION: .Other Dictation: Dictation Number [id number]  PLAN OF CARE: Admit to inpatient   PATIENT DISPOSITION:  Stable in OR   Delay start of Pharmacological VTE agent (>24hrs) due to surgical blood loss or risk of bleeding: yes

## 2015-05-10 NOTE — Op Note (Signed)
NAMEMAYLEA, SORIA NO.:  [id number]  MEDICAL RECORD NO.:  [id number]  LOCATION:  4854                         FACILITY:  [facility]  PHYSICIAN:  Kipp Brood. Anyjah Roundtree, M.D.DATE OF BIRTH:  [birth year]  DATE OF PROCEDURE:  05/10/2015 DATE OF DISCHARGE:                              OPERATIVE REPORT   SURGEON:  Kipp Brood. Gladstone Lighter, M.D.  ASSISTANT:  Ardeen Jourdain, [place]  PREOPERATIVE DIAGNOSES: 1. Severe primary osteoarthritis of the right knee. 2. Primary osteoarthritis of the left knee.  POSTOPERATIVE DIAGNOSES: 1. Severe primary osteoarthritis of the right knee. 2. Primary osteoarthritis of the left knee.  OPERATION:  We injected the left knee.  The sizes used, DePuy prosthesis was a size right femoral component posterior cruciate sacrificing-type. The tibial tray was a size 2.5, the insert was a rotating platform, polyethylene insert, 10 mm thickness, size 3.  The patella was a size 35 with 3 pegs and gentamicin was used in the cement.  DESCRIPTION OF PROCEDURE:  After the appropriate time-out was carried out and after I did mark the left knee first and I marked to inject it on it.  Then, in the operating room, we did a sterile prep and injected her from the lateral approach of the left knee with 40 mg of Depo-Medrol and 5 mL of 0.5% Marcaine.  At this time, we then went on and did the appropriate prepping and draping of the right lower extremity. Appropriate time-out was carried out separately for that and also marked the right knee in the holding area.  At this time, the patient had 2 g of IV Ancef and tranexamic acid.  Also IV Tylenol.  After the prepping and draping of the knee, the knee was Esmarched and tourniquet was elevated to 325 mmHg.  The right leg then was placed in the [facility] knee holder and flexed.  An anterior approach of the right knee was carried out.  Bleeders were identified and cauterized.  At this time, right median parapatellar incision was  made, patella was reflected laterally. Following that, we then did medial and lateral meniscectomies and excised the anterior approach of the cruciate ligaments.  Note that it was a tight because of the valgus deformity.  I did a lateral release on her that helped.  At this time, an initial drill hole was made in the intercondylar notch.  I removed an 11-mm thickness off the distal femur at this time.  Following that, we then measured the femur to be a size 3 right.  We did anterior, posterior and chamfering cuts for the next jig. At that time, we then directed attention to the tibial plateau.  We measured the tibial plateau to be about 2.5, right.  Initial drill hole was made in the tibial plateau down into the tibia.  The canal finder was inserted as it was in the femur above.  Each time, we irrigated out the canals.  We then inserted our next jig and removed 8 mm thickness totally using the medial side as a reference.  The knee was somewhat tight and we did make sure we removed all the spurs posteriorly as well. We also  did a partial lateral release on her.  At this particular time, after the tibia was prepared, we then inserted our lamina spreaders and removed the posterior spurs and then completed our lateral meniscectomy. Following that, we then inserted our spacer blocks, we had excellent function for stability.  She was a little tight in extension, but she came in the operating room with that knee very tight in extension and flexion.  She was elderly, we want to be very careful not to overstretch her posteriorly.  At this time, we then went on and did our keel cuts out the proximal tibia in usual fashion.  We then cut our notch cut out of the distal femur.  We inserted our trial components and we had good motion of the knee.  We may have had about 3 degrees of stable flexion contracture with the trials in.  We then did a resurfacing procedure on the patella.  Three drill holes were  made in the patella for a size 35- mm patella.  Following that, we then removed all trial components, water picked out the knee and cemented all 3 components in simultaneously. Gentamicin was used in the cement.  At this particular time, after the cement was hardened, we removed all loose pieces of cement, water picked out the posterior aspect of the knee as well to make sure there were no other loose cement fragments.  We then injected 20 mL of a mixture of 0.5% Marcaine plain and then following that later down the road, we injected a mixture of 20 mL of Exparel with 20 mL of normal saline.  Our permanent tray was inserted, it was a rotating platform, 10-mm thickness size 3.  The tibial tray was a size 2.5, the femur was a size 3, right. The patella was a 35.  We then reduced the knee, it had excellent function.  We had a much better correction of her contracture now. Thoroughly irrigated out the knee and then inserted a Hemovac drain and closed the knee in layers in usual fashion.  The patient was stable during the procedure.          ______________________________ Kipp Brood. Gladstone Lighter, M.D.     RAG/MEDQ  D:  05/10/2015  T:  05/10/2015  Job:  [id number]

## 2015-05-10 NOTE — Anesthesia Postprocedure Evaluation (Signed)
  Anesthesia Post-op Note  Patient: Stacey Jefferson  Procedure(s) Performed: Procedure(s) (LRB): TOTAL RIGHT  KNEE ARTHROPLASTY ;   LEFT KNEE STEROID INJECTION  (Bilateral)  Patient Location: PACU  Anesthesia Type: General  Level of Consciousness: awake and alert   Airway and Oxygen Therapy: Patient Spontanous Breathing  Post-op Pain: mild  Post-op Assessment: Post-op Vital signs reviewed, Patient's Cardiovascular Status Stable, Respiratory Function Stable, Patent Airway and No signs of Nausea or vomiting  Last Vitals:  Filed Vitals:   05/10/15 1315  BP: 117/45  Pulse: 60  Temp: 36.7 C  Resp: 16    Post-op Vital Signs: stable   Complications: No apparent anesthesia complications

## 2015-05-10 NOTE — Progress Notes (Signed)
Utilization review completed.  

## 2015-05-11 ENCOUNTER — Encounter (HOSPITAL_COMMUNITY): Payer: Self-pay | Admitting: Orthopedic Surgery

## 2015-05-11 LAB — BASIC METABOLIC PANEL
Anion gap: 7 (ref 5–15)
BUN: 13 mg/dL (ref 6–20)
CALCIUM: 8.5 mg/dL — AB (ref 8.9–10.3)
CO2: 26 mmol/L (ref 22–32)
Chloride: 106 mmol/L (ref 101–111)
Creatinine, Ser: 0.66 mg/dL (ref 0.44–1.00)
GFR calc Af Amer: >60 mL/min (ref >60)
GFR calc non Af Amer: >60 mL/min (ref >60)
Glucose, Bld: 151 mg/dL — ABNORMAL HIGH (ref 65–99)
POTASSIUM: 4.3 mmol/L (ref 3.5–5.1)
SODIUM: 139 mmol/L (ref 135–145)

## 2015-05-11 LAB — CBC
HEMATOCRIT: 32.9 % — AB (ref 36.0–46.0)
HEMOGLOBIN: 10.6 g/dL — AB (ref 12.0–15.0)
MCH: 29.9 pg (ref 26.0–34.0)
MCHC: 32.2 g/dL (ref 30.0–36.0)
MCV: 92.9 fL (ref 78.0–100.0)
PLATELETS: 112 10*3/uL — AB (ref 150–400)
RBC: 3.54 MIL/uL — ABNORMAL LOW (ref 3.87–5.11)
RDW: 12.9 % (ref 11.5–15.5)
WBC: 9.7 10*3/uL (ref 4.0–10.5)

## 2015-05-11 NOTE — Evaluation (Signed)
Occupational Therapy Evaluation Patient Details Name: Stacey Jefferson MRN: [id number] DOB: [birth year] Today's Date: 05/11/2015    History of Present Illness R TKR and injection L knee; hx of IBS, dysphagia, and polyneuropathy   Clinical Impression   This 81 year old female was admitted for the above. She will benefit from skilled OT to increase safety with adls and toilet transfers.  In acute setting, we will focus on standing balance/tolerance for adls and toilet transfers in acute setting with min A to min guard level goals.    Follow Up Recommendations  Supervision/Assistance - 24 hour;Home health OT    Equipment Recommendations  None recommended by OT    Recommendations for Other Services       Precautions / Restrictions Precautions Precautions: Knee;Fall Required Braces or Orthoses: Knee Immobilizer - Right Knee Immobilizer - Right: Discontinue once straight leg raise with < 10 degree lag Restrictions Weight Bearing Restrictions: No Other Position/Activity Restrictions: WBAT      Mobility Bed Mobility         Supine to sit: Min assist;Mod assist     General bed mobility comments: cues for sequence; pt used bedrail and HOB raised  Transfers   Equipment used: Rolling walker (2 wheeled) Transfers: Sit to/from Stand Sit to Stand: Mod assist;+2 physical assistance;+2 safety/equipment         General transfer comment: cues for LE management and use of UEs to self assist    Balance                                            ADL Overall ADL's : Needs assistance/impaired     Grooming: Minimal assistance;Sitting   Upper Body Bathing: Minimal assitance;Sitting   Lower Body Bathing: Maximal assistance;+2 for safety/equipment   Upper Body Dressing : Moderate assistance;Sitting   Lower Body Dressing: Total assistance;+2 for safety/equipment;Sit to/from stand   Toilet Transfer: Moderate assistance;+2 for physical assistance;Ambulation (for  sit to stand; ambulated; recliner)   Toileting- Clothing Manipulation and Hygiene: Total assistance;+2 for safety/equipment         General ADL Comments: daughter present and assisted with incontinence brief.  Pt needed only set up for dressing a couple of weeks ago     Vision Additional Comments: L eye partially closed   Perception     Praxis      Pertinent Vitals/Pain Faces Pain Scale: Hurts a little bit Pain Location: R knee Pain Descriptors / Indicators: Sore Pain Intervention(s): Limited activity within patient's tolerance;Monitored during session;Premedicated before session;Repositioned;Ice applied     Hand Dominance     Extremity/Trunk Assessment Upper Extremity Assessment Upper Extremity Assessment: RUE deficits/detail;LUE deficits/detail RUE Deficits / Details: limited movement; h/o 2 shoulder sxs.  Approximately 30 degrees FF LUE Deficits / Details: shoulder limited to approximately 70 degrees           Communication Communication Communication: HOH   Cognition Arousal/Alertness: Awake/alert Behavior During Therapy: WFL for tasks assessed/performed Overall Cognitive Status: Within Functional Limits for tasks assessed                     General Comments       Exercises       Shoulder Instructions      Home Living Family/patient expects to be discharged to:: Private residence Living Arrangements: Children Available Help at Discharge: Family Type of Home: House  Bathroom Shower/Tub: Occupational psychologist: Handicapped height     Home Equipment: Shower seat;Bedside commode          Prior Functioning/Environment Level of Independence: Needs assistance        Comments: dressed herself up until a couple of weeks ago.  Has 24/7    OT Diagnosis: Generalized weakness   OT Problem List: Decreased strength;Decreased activity tolerance;Impaired balance (sitting and/or standing);Decreased knowledge of use of  DME or AE;Pain;Impaired UE functional use   OT Treatment/Interventions: Self-care/ADL training;DME and/or AE instruction;Patient/family education;Therapeutic activities    OT Goals(Current goals can be found in the care plan section) Acute Rehab OT Goals Patient Stated Goal: HOME OT Goal Formulation: With patient/family Time For Goal Achievement: 05/18/15 Potential to Achieve Goals: Good ADL Goals Pt Will Perform Grooming: with min assist;standing Pt Will Transfer to Toilet: with min assist;ambulating;bedside commode Additional ADL Goal #1: pt wil maintain static standing for 2 minutes with min guard assist for adls  OT Frequency: Min 2X/week   Barriers to D/C:            Co-evaluation PT/OT/SLP Co-Evaluation/Treatment: Yes Reason for Co-Treatment: For patient/therapist safety PT goals addressed during session: Mobility/safety with mobility OT goals addressed during session: ADL's and self-care      End of Session    Activity Tolerance: Patient limited by fatigue Patient left: in chair;with call bell/phone within reach;with family/visitor present   Time: 1010-1038 OT Time Calculation (min): 28 min Charges:  OT General Charges $OT Visit: 1 Procedure OT Evaluation $Initial OT Evaluation Tier I: 1 Procedure G-Codes:    Taelor Moncada 2015-06-07, 11:13 AM  Lesle Chris, OTR/L [phone] 06/07/15

## 2015-05-11 NOTE — Care Management Note (Signed)
Case Management Note  Patient Details  Name: Stacey Jefferson MRN: [id number] Date of Birth: [birth year]  Subjective/Objective:                   TOTAL RIGHT KNEE ARTHROPLASTY ; LEFT KNEE STEROID INJECTION (Bilateral) Action/Plan: Discharge planning  Expected Discharge Date:  05/12/15               Expected Discharge Plan:  [place]  In-House Referral:     Discharge planning Services  CM Consult  Post Acute Care Choice:  Home Health Choice offered to:  Adult Children  DME Arranged:  Walker rolling DME Agency:  [place] Arranged:  PT, OT HH Agency:  [place]  Status of Service:  Completed, signed off  Medicare Important Message Given:    Date Medicare IM Given:    Medicare IM give by:    Date Additional Medicare IM Given:    Additional Medicare Important Message give by:     If discussed at [place] of Stay Meetings, dates discussed:    Additional Comments: CM met with pt and daughter, Stacey Jefferson [employer] employee) in room to offer choice of home health agency.  Pt chooses [facility] to render HHPT/OT.  Address and contact information verified by pt and pt prefers daughter Stacey Jefferson to handle the scheduling [phone].  Referral accepted by [facility] rep, tim (on unit).  CM called [facility] DME rep, Kristen to please deliver the rolling walker to room prior to discharge.  No other CM needs were communicated. Dellie Catholic, RN 05/11/2015, 2:06 PM

## 2015-05-11 NOTE — Progress Notes (Signed)
Physical Therapy Treatment Patient Details Name: Stacey Jefferson MRN: [id number] DOB: September 21, 1925 Today's Date: 05/11/2015    History of Present Illness R TKR and injection L knee; hx of IBS, dysphagia, and polyneuropathy    PT Comments    Pt progressing slowly with mobility.  Pt's dtr states she feels she will have sufficient assist at home with family and hired care  Follow Up Recommendations  Home health PT;SNF     Equipment Recommendations  Rolling walker with 5" wheels    Recommendations for Other Services OT consult     Precautions / Restrictions Precautions Precautions: Knee;Fall Required Braces or Orthoses: Knee Immobilizer - Right Knee Immobilizer - Right: Discontinue once straight leg raise with < 10 degree lag Restrictions Weight Bearing Restrictions: No Other Position/Activity Restrictions: WBAT    Mobility  Bed Mobility Overal bed mobility: Needs Assistance Bed Mobility: Supine to Sit     Supine to sit: Min assist;Mod assist     General bed mobility comments: cues for sequence; pt used bedrail and HOB raised  Transfers Overall transfer level: Needs assistance Equipment used: Rolling walker (2 wheeled) Transfers: Sit to/from Stand Sit to Stand: Mod assist;+2 physical assistance;+2 safety/equipment         General transfer comment: cues for LE management and use of UEs to self assist  Ambulation/Gait Ambulation/Gait assistance: Mod assist;+2 safety/equipment (chair follow) Ambulation Distance (Feet): 14 Feet Assistive device: Rolling walker (2 wheeled) Gait Pattern/deviations: Step-to pattern;Decreased step length - right;Decreased step length - left;Shuffle;Trunk flexed Gait velocity: decr   General Gait Details: cues for sequence, posture and position from [employer]            Wheelchair Mobility    Modified Rankin (Stroke Patients Only)       Balance                                    Cognition  Arousal/Alertness: Awake/alert Behavior During Therapy: WFL for tasks assessed/performed Overall Cognitive Status: Within Functional Limits for tasks assessed                      Exercises Total Joint Exercises Ankle Circles/Pumps: AROM;Both;Supine;20 reps Quad Sets: AROM;Both;10 reps;Supine Heel Slides: AAROM;Right;15 reps;Supine Straight Leg Raises: AAROM;Right;10 reps;Supine Goniometric ROM: AAROM R knee -12 - 60    General Comments        Pertinent Vitals/Pain Pain Assessment: Faces Faces Pain Scale: Hurts a little bit Pain Location: R knee Pain Descriptors / Indicators: Sore Pain Intervention(s): Limited activity within patient's tolerance;Monitored during session;Premedicated before session;Repositioned;Ice applied    Home Living Family/patient expects to be discharged to:: Private residence Living Arrangements: Children Available Help at Discharge: Family Type of Home: House       Home Equipment: Shower seat;Bedside commode      Prior Function Level of Independence: Needs assistance      Comments: dressed herself up until a couple of weeks ago.  Has 24/7   PT Goals (current goals can now be found in the care plan section) Acute Rehab PT Goals Patient Stated Goal: HOME PT Goal Formulation: With patient/family Time For Goal Achievement: 05/17/15 Potential to Achieve Goals: Fair Progress towards PT goals: Progressing toward goals    Frequency  7X/week    PT Plan Current plan remains appropriate    Co-evaluation PT/OT/SLP Co-Evaluation/Treatment: Yes Reason for Co-Treatment: For patient/therapist safety PT goals addressed during  session: Mobility/safety with mobility OT goals addressed during session: ADL's and self-care     End of Session Equipment Utilized During Treatment: Gait belt;Right knee immobilizer Activity Tolerance: Patient tolerated treatment well;Patient limited by fatigue Patient left: in chair;with call bell/phone within  reach;with family/visitor present     Time: 3729-0211 PT Time Calculation (min) (ACUTE ONLY): 29 min  Charges:  $Gait Training: 8-22 mins $Therapeutic Exercise: 23-37 mins                    G Codes:      Elizandro Laura May 30, 2015, 12:31 PM

## 2015-05-11 NOTE — Discharge Instructions (Addendum)
INSTRUCTIONS AFTER JOINT REPLACEMENT   Remove items at home which could result in a fall. This includes throw rugs or furniture in walking pathways ICE to the affected joint every three hours while awake for 30 minutes at a time, for at least the first 3-5 days, and then as needed for pain and swelling.  Continue to use ice for pain and swelling. You may notice swelling that will progress down to the foot and ankle.  This is normal after surgery.  Elevate your leg when you are not up walking on it.   Continue to use the breathing machine you got in the hospital (incentive spirometer) which will help keep your temperature down.  It is common for your temperature to cycle up and down following surgery, especially at night when you are not up moving around and exerting yourself.  The breathing machine keeps your lungs expanded and your temperature down.   DIET:  As you were doing prior to hospitalization, we recommend a well-balanced diet.  DRESSING / WOUND CARE / SHOWERING  You may shower 3 days after surgery, but keep the wounds dry during showering.  You may use an occlusive plastic wrap (Press'n Seal for example), NO SOAKING/SUBMERGING IN THE BATHTUB.  If the bandage gets wet, change with a clean dry gauze.  If the incision gets wet, pat the wound dry with a clean towel.  ACTIVITY  Increase activity slowly as tolerated, but follow the weight bearing instructions below.   No driving for 6 weeks or until further direction given by your physician.  You cannot drive while taking narcotics.  No lifting or carrying greater than 10 lbs. until further directed by your surgeon. Avoid periods of inactivity such as sitting longer than an hour when not asleep. This helps prevent blood clots.  You may return to work once you are authorized by your doctor.     WEIGHT BEARING   Weight bearing as tolerated with assist device (walker, cane, etc) as directed, use it as long as suggested by your surgeon or  therapist, typically at least 4-6 weeks.   EXERCISES  Results after joint replacement surgery are often greatly improved when you follow the exercise, range of motion and muscle strengthening exercises prescribed by your doctor. Safety measures are also important to protect the joint from further injury. Any time any of these exercises cause you to have increased pain or swelling, decrease what you are doing until you are comfortable again and then slowly increase them. If you have problems or questions, call your caregiver or physical therapist for advice.   Rehabilitation is important following a joint replacement. After just a few days of immobilization, the muscles of the leg can become weakened and shrink (atrophy).  These exercises are designed to build up the tone and strength of the thigh and leg muscles and to improve motion. Often times heat used for twenty to thirty minutes before working out will loosen up your tissues and help with improving the range of motion but do not use heat for the first two weeks following surgery (sometimes heat can increase post-operative swelling).   These exercises can be done on a training (exercise) mat, on the floor, on a table or on a bed. Use whatever works the best and is most comfortable for you.    Use music or television while you are exercising so that the exercises are a pleasant break in your day. This will make your life better with the exercises acting   as a break in your routine that you can look forward to.   Perform all exercises about fifteen times, three times per day or as directed.  You should exercise both the operative leg and the other leg as well.   Exercises include:   Quad Sets - Tighten up the muscle on the front of the thigh (Quad) and hold for 5-10 seconds.   Straight Leg Raises - With your knee straight (if you were given a brace, keep it on), lift the leg to 60 degrees, hold for 3 seconds, and slowly lower the leg.  Perform this  exercise against resistance later as your leg gets stronger.  Leg Slides: Lying on your back, slowly slide your foot toward your buttocks, bending your knee up off the floor (only go as far as is comfortable). Then slowly slide your foot back down until your leg is flat on the floor again.  Angel Wings: Lying on your back spread your legs to the side as far apart as you can without causing discomfort.  Hamstring Strength:  Lying on your back, push your heel against the floor with your leg straight by tightening up the muscles of your buttocks.  Repeat, but this time bend your knee to a comfortable angle, and push your heel against the floor.  You may put a pillow under the heel to make it more comfortable if necessary.   A rehabilitation program following joint replacement surgery can speed recovery and prevent re-injury in the future due to weakened muscles. Contact your doctor or a physical therapist for more information on knee rehabilitation.    CONSTIPATION  Constipation is defined medically as fewer than three stools per week and severe constipation as less than one stool per week.  Even if you have a regular bowel pattern at home, your normal regimen is likely to be disrupted due to multiple reasons following surgery.  Combination of anesthesia, postoperative narcotics, change in appetite and fluid intake all can affect your bowels.   YOU MUST use at least one of the following options; they are listed in order of increasing strength to get the job done.  They are all available over the counter, and you may need to use some, POSSIBLY even all of these options:    Drink plenty of fluids (prune juice may be helpful) and high fiber foods Colace 100 mg by mouth twice a day  Senokot for constipation as directed and as needed Dulcolax (bisacodyl), take with full glass of water  Miralax (polyethylene glycol) once or twice a day as needed.  If you have tried all these things and are unable to have a  bowel movement in the first 3-4 days after surgery call either your surgeon or your primary doctor.    If you experience loose stools or diarrhea, hold the medications until you stool forms back up.  If your symptoms do not get better within 1 week or if they get worse, check with your doctor.  If you experience "the worst abdominal pain ever" or develop nausea or vomiting, please contact the office immediately for further recommendations for treatment.   ITCHING:  If you experience itching with your medications, try taking only a single pain pill, or even half a pain pill at a time.  You can also use Benadryl over the counter for itching or also to help with sleep.   TED HOSE STOCKINGS:  Use stockings on both legs until for at least 2 weeks or as directed  by physician office. They may be removed at night for sleeping.  MEDICATIONS:  See your medication summary on the After Visit Summary that nursing will review with you.  You may have some home medications which will be placed on hold until you complete the course of blood thinner medication.  It is important for you to complete the blood thinner medication as prescribed.  PRECAUTIONS:  If you experience chest pain or shortness of breath - call 911 immediately for transfer to the hospital emergency department.   If you develop a fever greater that 101 F, purulent drainage from wound, increased redness or drainage from wound, foul odor from the wound/dressing, or calf pain - CONTACT YOUR SURGEON.                                                   FOLLOW-UP APPOINTMENTS:  If you do not already have a post-op appointment, please call the office for an appointment to be seen by your surgeon.  Guidelines for how soon to be seen are listed in your After Visit Summary, but are typically between 1-4 weeks after surgery.   MAKE SURE YOU:  Understand these instructions.  Get help right away if you are not doing well or get worse.    Thank you for  letting us be a part of your medical care team.  It is a privilege we respect greatly.  We hope these instructions will help you stay on track for a fast and full recovery!   Information on my medicine - XARELTO (Rivaroxaban)  This medication education was reviewed with me or my healthcare representative as part of my discharge preparation.  The pharmacist that spoke with me during my hospital stay was:  WOFFORD, DREW A, RPH  Why was Xarelto prescribed for you? Xarelto was prescribed for you to reduce the risk of blood clots forming after orthopedic surgery. The medical term for these abnormal blood clots is venous thromboembolism (VTE).  What do you need to know about xarelto ? Take your Xarelto ONCE DAILY at the same time every day. You may take it either with or without food.  If you have difficulty swallowing the tablet whole, you may crush it and mix in applesauce just prior to taking your dose.  Take Xarelto exactly as prescribed by your doctor and DO NOT stop taking Xarelto without talking to the doctor who prescribed the medication.  Stopping without other VTE prevention medication to take the place of Xarelto may increase your risk of developing a clot.  After discharge, you should have regular check-up appointments with your healthcare provider that is prescribing your Xarelto.    What do you do if you miss a dose? If you miss a dose, take it as soon as you remember on the same day then continue your regularly scheduled once daily regimen the next day. Do not take two doses of Xarelto on the same day.   Important Safety Information A possible side effect of Xarelto is bleeding. You should call your healthcare provider right away if you experience any of the following: ? Bleeding from an injury or your nose that does not stop. ? Unusual colored urine (red or dark brown) or unusual colored stools (red or black). ? Unusual bruising for unknown reasons. ? A serious fall or  if you hit your  head (even if there is no bleeding).  Some medicines may interact with Xarelto and might increase your risk of bleeding while on Xarelto. To help avoid this, consult your healthcare provider or pharmacist prior to using any new prescription or non-prescription medications, including herbals, vitamins, non-steroidal anti-inflammatory drugs (NSAIDs) and supplements.  This website has more information on Xarelto: [email or web address]

## 2015-05-11 NOTE — Progress Notes (Signed)
Physical Therapy Treatment Patient Details Name: Stacey Jefferson MRN: [id number] DOB: October 07, 1925 Today's Date: 05/11/2015    History of Present Illness R TKR and injection L knee; hx of IBS, dysphagia, and polyneuropathy    PT Comments    Progressing, but slowly, with mobility.  Follow Up Recommendations  Home health PT;SNF     Equipment Recommendations  Rolling walker with 5" wheels    Recommendations for Other Services OT consult     Precautions / Restrictions Precautions Precautions: Knee;Fall Required Braces or Orthoses: Knee Immobilizer - Right Knee Immobilizer - Right: Discontinue once straight leg raise with < 10 degree lag Restrictions Weight Bearing Restrictions: No Other Position/Activity Restrictions: WBAT    Mobility  Bed Mobility Overal bed mobility: Needs Assistance Bed Mobility: Sit to Supine       Sit to supine: Min assist;+2 for physical assistance;+2 for safety/equipment   General bed mobility comments: cues for sequence; pt used bedrail and HOB raised  Transfers Overall transfer level: Needs assistance Equipment used: Rolling walker (2 wheeled) Transfers: Sit to/from Stand Sit to Stand: Mod assist;+2 physical assistance;+2 safety/equipment         General transfer comment: cues for LE management and use of UEs to self assist  Ambulation/Gait Ambulation/Gait assistance: Mod assist;+2 safety/equipment Ambulation Distance (Feet): 17 Feet Assistive device: Rolling walker (2 wheeled) Gait Pattern/deviations: Step-to pattern;Decreased step length - right;Decreased step length - left;Shuffle;Trunk flexed Gait velocity: decr   General Gait Details: multi-modal cues for sequence, posture, increased BOS, IR on L and position from [employer]            Wheelchair Mobility    Modified Rankin (Stroke Patients Only)       Balance                                    Cognition Arousal/Alertness: Awake/alert Behavior  During Therapy: WFL for tasks assessed/performed Overall Cognitive Status: Within Functional Limits for tasks assessed                      Exercises      General Comments        Pertinent Vitals/Pain Pain Assessment: Faces Faces Pain Scale: Hurts a little bit Pain Location: R knee Pain Descriptors / Indicators: Aching;Sore Pain Intervention(s): Limited activity within patient's tolerance;Monitored during session;Premedicated before session;Ice applied    Home Living                      Prior Function            PT Goals (current goals can now be found in the care plan section) Acute Rehab PT Goals Patient Stated Goal: HOME PT Goal Formulation: With patient/family Time For Goal Achievement: 05/17/15 Potential to Achieve Goals: Fair Progress towards PT goals: Progressing toward goals    Frequency  7X/week    PT Plan Current plan remains appropriate    Co-evaluation             End of Session Equipment Utilized During Treatment: Gait belt;Right knee immobilizer Activity Tolerance: Patient tolerated treatment well;Patient limited by fatigue Patient left: with call bell/phone within reach;with family/visitor present;in bed     Time: 1425-1450 PT Time Calculation (min) (ACUTE ONLY): 25 min  Charges:  $Gait Training: 23-37 mins  G Codes:      Noely Kuhnle 2015-05-26, 3:14 PM

## 2015-05-11 NOTE — Progress Notes (Signed)
Physical Therapy Treatment Patient Details Name: Stacey Jefferson MRN: [id number] DOB: 10/08/1925 Today's Date: 05/11/2015    History of Present Illness R TKR and injection L knee; hx of IBS, dysphagia, and polyneuropathy    PT Comments    Initiated therex.  OOB deferred to allow pt requested break  Follow Up Recommendations  Home health PT;SNF     Equipment Recommendations  None recommended by PT    Recommendations for Other Services OT consult     Precautions / Restrictions Precautions Precautions: Knee;Fall Required Braces or Orthoses: Knee Immobilizer - Right Knee Immobilizer - Right: Discontinue once straight leg raise with < 10 degree lag Restrictions Weight Bearing Restrictions: No Other Position/Activity Restrictions: WBAT    Mobility  Bed Mobility         Supine to sit: Min assist;Mod assist     General bed mobility comments: cues for sequence; pt used bedrail and HOB raised  Transfers   Equipment used: Rolling walker (2 wheeled) Transfers: Sit to/from Stand Sit to Stand: Mod assist;+2 physical assistance;+2 safety/equipment         General transfer comment: cues for LE management and use of UEs to self assist  Ambulation/Gait                 Stairs            Wheelchair Mobility    Modified Rankin (Stroke Patients Only)       Balance                                    Cognition Arousal/Alertness: Awake/alert Behavior During Therapy: WFL for tasks assessed/performed Overall Cognitive Status: Within Functional Limits for tasks assessed                      Exercises Total Joint Exercises Ankle Circles/Pumps: AROM;Both;Supine;20 reps Quad Sets: AROM;Both;10 reps;Supine Heel Slides: AAROM;Right;15 reps;Supine Straight Leg Raises: AAROM;Right;10 reps;Supine Goniometric ROM: AAROM R knee -12 - 60    General Comments        Pertinent Vitals/Pain Pain Assessment: Faces Faces Pain Scale: Hurts a  little bit Pain Location: R knee Pain Descriptors / Indicators: Sore Pain Intervention(s): Limited activity within patient's tolerance;Monitored during session;Premedicated before session;Repositioned;Ice applied    Home Living Family/patient expects to be discharged to:: Private residence Living Arrangements: Children Available Help at Discharge: Family Type of Home: House       Home Equipment: Shower seat;Bedside commode      Prior Function Level of Independence: Needs assistance      Comments: dressed herself up until a couple of weeks ago.  Has 24/7   PT Goals (current goals can now be found in the care plan section) Acute Rehab PT Goals Patient Stated Goal: HOME PT Goal Formulation: With patient/family Time For Goal Achievement: 05/17/15 Potential to Achieve Goals: Fair Progress towards PT goals: Progressing toward goals    Frequency  7X/week    PT Plan Current plan remains appropriate    Co-evaluation   Reason for Co-Treatment: For patient/therapist safety PT goals addressed during session: Mobility/safety with mobility OT goals addressed during session: ADL's and self-care     End of Session   Activity Tolerance: Patient tolerated treatment well;Patient limited by fatigue;Patient limited by pain Patient left: in bed;with call bell/phone within reach;with nursing/sitter in room;with family/visitor present     Time: 0865-7846 PT Time Calculation (min) (ACUTE  ONLY): 23 min  Charges:  $Therapeutic Exercise: 23-37 mins                    G Codes:      Amanpreet Delmont 05-26-15, 12:26 PM

## 2015-05-11 NOTE — Progress Notes (Signed)
   Subjective: 1 Day Post-Op Procedure(s) (LRB): TOTAL RIGHT  KNEE ARTHROPLASTY ;   LEFT KNEE STEROID INJECTION  (Bilateral) Patient reports pain as moderate.   Patient seen in rounds for Dr. Gladstone Lighter. Patient is well, and has had no acute complaints or problems. Reports that her pain is well-controlled. No issues overnight. No SOB or chest pain.    Objective: Vital signs in last 24 hours: Temp:  [97.7 F (36.5 C)-98.3 F (36.8 C)] 98 F (36.7 C) (07/14 0606) Pulse Rate:  [56-67] 58 (07/14 0606) Resp:  [11-16] 16 (07/14 0606) BP: (116-141)/(43-68) 121/51 mmHg (07/14 0606) SpO2:  [98 %-100 %] 98 % (07/14 0606) Weight:  [65.318 kg (144 lb)] 65.318 kg (144 lb) (07/13 1211)  Intake/Output from previous day:  Intake/Output Summary (Last 24 hours) at 05/11/15 0745 Last data filed at 05/11/15 0606  Gross per 24 hour  Intake   3535 ml  Output   1422 ml  Net   2113 ml     Labs:  Recent Labs  05/11/15 0418  HGB 10.6*    Recent Labs  05/11/15 0418  WBC 9.7  RBC 3.54*  HCT 32.9*  PLT 112*    Recent Labs  05/11/15 0418  NA 139  K 4.3  CL 106  CO2 26  BUN 13  CREATININE 0.66  GLUCOSE 151*  CALCIUM 8.5*    EXAM General - Patient is Alert and Oriented Extremity - Neurologically intact Intact pulses distally Dorsiflexion/Plantar flexion intact Dressing - dressing C/D/I Motor Function - intact, moving foot and toes well on exam.  Hemovac pulled without difficulty.  Past Medical History  Diagnosis Date  . Weight loss   . Vomiting   . Dysphagia, unspecified(787.20)   . Memory loss   . Hypotension, unspecified   . Irritable bowel syndrome   . Restless legs syndrome (RLS)   . Carpal tunnel syndrome   . Abnormality of gait   . Headache   . Pain in limb   . Polyneuropathy in other diseases classified elsewhere   . Hypertension   . GERD (gastroesophageal reflux disease)   . Arthritis   . Cancer     skin cancer left cheek with Mohrs     Assessment/Plan: 1 Day Post-Op Procedure(s) (LRB): TOTAL RIGHT  KNEE ARTHROPLASTY ;   LEFT KNEE STEROID INJECTION  (Bilateral) Active Problems:   History of total knee arthroplasty  Estimated body mass index is 27.22 kg/(m^2) as calculated from the following:   Height as of this encounter: 5\' 1"  (1.549 m).   Weight as of this encounter: 65.318 kg (144 lb). Advance diet Up with therapy D/C IV fluids when tolerating POs well  DVT Prophylaxis - Xarelto Weight-Bearing as tolerated  D/C O2 and Pulse OX and try on Room Air   She is doing well. Will do PT today. Plan for DC home tomorrow with HHPT pending continued progress.   Ardeen Jourdain, PA-C Orthopaedic Surgery 05/11/2015, 7:45 AM

## 2015-05-12 LAB — BASIC METABOLIC PANEL
Anion gap: 3 — ABNORMAL LOW (ref 5–15)
BUN: 14 mg/dL (ref 6–20)
CHLORIDE: 107 mmol/L (ref 101–111)
CO2: 29 mmol/L (ref 22–32)
Calcium: 8.7 mg/dL — ABNORMAL LOW (ref 8.9–10.3)
Creatinine, Ser: 0.65 mg/dL (ref 0.44–1.00)
Glucose, Bld: 114 mg/dL — ABNORMAL HIGH (ref 65–99)
Potassium: 4.4 mmol/L (ref 3.5–5.1)
SODIUM: 139 mmol/L (ref 135–145)

## 2015-05-12 LAB — CBC
HEMATOCRIT: 27.9 % — AB (ref 36.0–46.0)
Hemoglobin: 9.4 g/dL — ABNORMAL LOW (ref 12.0–15.0)
MCH: 32.1 pg (ref 26.0–34.0)
MCHC: 33.7 g/dL (ref 30.0–36.0)
MCV: 95.2 fL (ref 78.0–100.0)
PLATELETS: 118 10*3/uL — AB (ref 150–400)
RBC: 2.93 MIL/uL — ABNORMAL LOW (ref 3.87–5.11)
RDW: 13.3 % (ref 11.5–15.5)
WBC: 8.5 10*3/uL (ref 4.0–10.5)

## 2015-05-12 MED ORDER — TRAMADOL HCL 50 MG PO TABS: 50.0000 mg | ORAL_TABLET | Freq: Four times a day (QID) | ORAL | Status: DC | PRN

## 2015-05-12 MED ORDER — FERROUS SULFATE 325 (65 FE) MG PO TABS: 325.0000 mg | ORAL_TABLET | Freq: Two times a day (BID) | ORAL | Status: DC

## 2015-05-12 MED ORDER — RIVAROXABAN 10 MG PO TABS: 10.0000 mg | ORAL_TABLET | Freq: Every day | ORAL | Status: DC

## 2015-05-12 MED ORDER — ACETAMINOPHEN 160 MG/5ML PO SOLN: 650.0000 mg | Freq: Four times a day (QID) | ORAL | Status: DC | PRN

## 2015-05-12 MED ORDER — METHOCARBAMOL 500 MG PO TABS: 500.0000 mg | ORAL_TABLET | Freq: Four times a day (QID) | ORAL | Status: DC | PRN

## 2015-05-12 NOTE — Care Management Important Message (Signed)
Important Message  Patient Details  Name: Stacey Jefferson MRN: [id number] Date of Birth: 09-05-1925   Medicare Important Message Given:  Yes-second notification given    Camillo Flaming 05/12/2015, 12:57 Cyril Message  Patient Details  Name: Stacey Jefferson MRN: [id number] Date of Birth: 1925/05/11   Medicare Important Message Given:  Yes-second notification given    Camillo Flaming 05/12/2015, 12:57 PM

## 2015-05-12 NOTE — Progress Notes (Signed)
Subjective: 2 Days Post-Op Procedure(s) (LRB): TOTAL RIGHT  KNEE ARTHROPLASTY ;   LEFT KNEE STEROID INJECTION  (Bilateral) Patient reports pain as 1 on 0-10 scale.    Objective: Vital signs in last 24 hours: Temp:  [98.4 F (36.9 C)-98.6 F (37 C)] 98.6 F (37 C) (07/14 2100) Pulse Rate:  [64-68] 68 (07/14 2100) Resp:  [16] 16 (07/14 2100) BP: (116-151)/(42-54) 151/54 mmHg (07/14 2100) SpO2:  [99 %] 99 % (07/14 2100)  Intake/Output from previous day: 07/14 0701 - 07/15 0700 In: 360 [P.O.:360] Out: -  Intake/Output this shift:     Recent Labs  05/11/15 0418 05/12/15 0416  HGB 10.6* 9.4*    Recent Labs  05/11/15 0418 05/12/15 0416  WBC 9.7 8.5  RBC 3.54* 2.93*  HCT 32.9* 27.9*  PLT 112* 118*    Recent Labs  05/11/15 0418 05/12/15 0416  NA 139 139  K 4.3 4.4  CL 106 107  CO2 26 29  BUN 13 14  CREATININE 0.66 0.65  GLUCOSE 151* 114*  CALCIUM 8.5* 8.7*   No results for input(s): LABPT, INR in the last 72 hours.  Neurologically intact Dorsiflexion/Plantar flexion intact Compartment soft  Assessment/Plan: 2 Days Post-Op Procedure(s) (LRB): TOTAL RIGHT  KNEE ARTHROPLASTY ;   LEFT KNEE STEROID INJECTION  (Bilateral) Up with therapy Discharge home with home health  Verlie Liotta A 05/12/2015, 6:56 AM

## 2015-05-12 NOTE — Progress Notes (Signed)
05/12/15  1500  Reviewed discharge instructions with patient and her daughter [name]. Both verbalized understanding of discharge instructions.  Copy of discharge instructions and prescriptions were given to patient's daughter.

## 2015-05-12 NOTE — Progress Notes (Signed)
OT Cancellation Note  Patient Details Name: Stacey Jefferson MRN: [id number] DOB: 11-27-1924   Cancelled Treatment:    Reason Eval/Treat Not Completed: Other (comment).  Pt reports that she has had a difficult morning.  Will check back later, if schedule permits.  Kirandeep Fariss 05/12/2015, 11:21 AM  Lesle Chris, OTR/L [phone] 05/12/2015

## 2015-05-12 NOTE — Progress Notes (Signed)
Physical Therapy Treatment Patient Details Name: Stacey Jefferson MRN: [id number] DOB: May 17, 1925 Today's Date: 05/12/2015    History of Present Illness R TKR and injection L knee; hx of IBS, dysphagia, and polyneuropathy    PT Comments    Improvement noted with bed mobility and transfers.  Pt continues to struggle with ambulation 2* ltd upper body strength.  Reviewed don/doff KI with pt and children.  Pt's dtr and son state they feel comfortable with ability to provide level of care currently needed at home.  Follow Up Recommendations  Home health PT;SNF     Equipment Recommendations  Rolling walker with 5" wheels    Recommendations for Other Services OT consult     Precautions / Restrictions Precautions Precautions: Knee;Fall Required Braces or Orthoses: Knee Immobilizer - Right Knee Immobilizer - Right: Discontinue once straight leg raise with < 10 degree lag Restrictions Weight Bearing Restrictions: No Other Position/Activity Restrictions: WBAT    Mobility  Bed Mobility Overal bed mobility: Needs Assistance Bed Mobility: Supine to Sit     Supine to sit: Min assist;Mod assist     General bed mobility comments: cues for sequence and use of UEs to self assist  Transfers Overall transfer level: Needs assistance Equipment used: Rolling walker (2 wheeled) Transfers: Sit to/from Stand Sit to Stand: Min assist;+2 safety/equipment         General transfer comment: cues for LE management and use of UEs to self assist  Ambulation/Gait Ambulation/Gait assistance: Min assist;+2 safety/equipment Ambulation Distance (Feet): 15 Feet Assistive device: Rolling walker (2 wheeled) Gait Pattern/deviations: Step-to pattern;Decreased step length - right;Decreased step length - left;Shuffle;Trunk flexed Gait velocity: decr   General Gait Details: multi-modal cues for sequence, posture, increased BOS, IR on L and position from [employer]            Wheelchair Mobility     Modified Rankin (Stroke Patients Only)       Balance             Standing balance-Leahy Scale: Poor                      Cognition Arousal/Alertness: Awake/alert Behavior During Therapy: WFL for tasks assessed/performed Overall Cognitive Status: Within Functional Limits for tasks assessed                      Exercises Total Joint Exercises Ankle Circles/Pumps: AROM;Both;Supine;20 reps Quad Sets: AROM;Both;Supine;15 reps Heel Slides: AAROM;Right;Supine;20 reps Straight Leg Raises: AAROM;Right;Supine;20 reps Goniometric ROM: AAROM at R knee -15 - 75    General Comments        Pertinent Vitals/Pain Pain Assessment: Faces Faces Pain Scale: Hurts little more Pain Location: R knee Pain Descriptors / Indicators: Sore Pain Intervention(s): Limited activity within patient's tolerance;Monitored during session;Premedicated before session;Ice applied    Home Living                      Prior Function            PT Goals (current goals can now be found in the care plan section) Acute Rehab PT Goals Patient Stated Goal: HOME PT Goal Formulation: With patient/family Time For Goal Achievement: 05/17/15 Potential to Achieve Goals: Fair Progress towards PT goals: Progressing toward goals    Frequency  7X/week    PT Plan Current plan remains appropriate    Co-evaluation             End  of Session Equipment Utilized During Treatment: Gait belt;Right knee immobilizer Activity Tolerance: Patient tolerated treatment well;Patient limited by fatigue Patient left: in chair;with call bell/phone within reach;with family/visitor present     Time: 1345-1440 PT Time Calculation (min) (ACUTE ONLY): 55 min  Charges:  $Gait Training: 8-22 mins $Therapeutic Exercise: 23-37 mins $Therapeutic Activity: 8-22 mins                    G Codes:      Stacey Jefferson Jun 07, 2015, 3:58 PM

## 2015-05-12 NOTE — Progress Notes (Signed)
Occupational Therapy Treatment Patient Details Name: Stacey Jefferson MRN: [id number] DOB: [birth year] Today's Date: 05/12/2015    History of present illness R TKR and injection L knee; hx of IBS, dysphagia, and polyneuropathy   OT comments  Pt stood twice during dressing activity with OT.  Tolerated well, and preparing for discharge home  Follow Up Recommendations  Supervision/Assistance - 24 hour;Home health OT    Equipment Recommendations  None recommended by OT    Recommendations for Other Services      Precautions / Restrictions Precautions Precautions: Knee;Fall Required Braces or Orthoses: Knee Immobilizer - Right Knee Immobilizer - Right: Discontinue once straight leg raise with < 10 degree lag Restrictions Weight Bearing Restrictions: No Other Position/Activity Restrictions: WBAT       Mobility Bed Mobility            General bed mobility comments: oob  Transfers Overall transfer level: Needs assistance Equipment used: Rolling walker (2 wheeled) Transfers: Sit to/from Stand Sit to Stand: Mod assist;+2 safety/equipment (from recliner)         General transfer comment: cues for UE/LE placement and to scoot forward prior to attempting to stand    Balance             Standing balance-Leahy Scale: Poor                     ADL                                         General ADL Comments: Assisted pt with LB hygiene, changing depends pad, performing hygiene and donning new depends pad and pants in preparation for home.  She stands with mod A and needs min A to maintain balance for this.  Stood twice for 2-3 minutes at a time.  Daughter [name] present and provided, +2 assist for safety.  Pt did not wish to use commode.      Vision                     Perception     Praxis      Cognition   Behavior During Therapy: WFL for tasks assessed/performed Overall Cognitive Status: Within Functional Limits for tasks  assessed                       Extremity/Trunk Assessment               Exercises    Shoulder Instructions       General Comments      Pertinent Vitals/ Pain       Pain Assessment: Faces Faces Pain Scale: Hurts little more Pain Location: R knee Pain Descriptors / Indicators: Sore Pain Intervention(s): Limited activity within patient's tolerance;Monitored during session;Premedicated before session;Repositioned  Home Living                                          Prior Functioning/Environment              Frequency Min 2X/week     Progress Toward Goals  OT Goals(current goals can now be found in the care plan section)  Progress towards OT goals: Progressing toward goals  Acute Rehab OT Goals Patient Stated Goal: HOME  Plan  Discharge plan remains appropriate    Co-evaluation                 End of Session     Activity Tolerance Patient tolerated treatment well   Patient Left in chair;with call bell/phone within reach;with family/visitor present   Nurse Communication          Time: 7619-5093 OT Time Calculation (min): 28 min  Charges: OT General Charges $OT Visit: 1 Procedure OT Treatments $Self Care/Home Management : 23-37 mins  Delsin Copen 05/12/2015, 4:13 PM  Lesle Chris, OTR/L [phone] 05/12/2015

## 2015-05-13 DIAGNOSIS — G629 Polyneuropathy, unspecified: Secondary | ICD-10-CM | POA: Diagnosis not present

## 2015-05-13 DIAGNOSIS — Z96651 Presence of right artificial knee joint: Secondary | ICD-10-CM | POA: Diagnosis not present

## 2015-05-13 DIAGNOSIS — G2581 Restless legs syndrome: Secondary | ICD-10-CM | POA: Diagnosis not present

## 2015-05-13 DIAGNOSIS — Z471 Aftercare following joint replacement surgery: Secondary | ICD-10-CM | POA: Diagnosis not present

## 2015-05-13 DIAGNOSIS — H9193 Unspecified hearing loss, bilateral: Secondary | ICD-10-CM | POA: Diagnosis not present

## 2015-05-13 DIAGNOSIS — M1990 Unspecified osteoarthritis, unspecified site: Secondary | ICD-10-CM | POA: Diagnosis not present

## 2015-05-15 DIAGNOSIS — Z96651 Presence of right artificial knee joint: Secondary | ICD-10-CM | POA: Diagnosis not present

## 2015-05-15 DIAGNOSIS — Z471 Aftercare following joint replacement surgery: Secondary | ICD-10-CM | POA: Diagnosis not present

## 2015-05-15 DIAGNOSIS — G2581 Restless legs syndrome: Secondary | ICD-10-CM | POA: Diagnosis not present

## 2015-05-15 DIAGNOSIS — G629 Polyneuropathy, unspecified: Secondary | ICD-10-CM | POA: Diagnosis not present

## 2015-05-15 DIAGNOSIS — H9193 Unspecified hearing loss, bilateral: Secondary | ICD-10-CM | POA: Diagnosis not present

## 2015-05-15 DIAGNOSIS — M1990 Unspecified osteoarthritis, unspecified site: Secondary | ICD-10-CM | POA: Diagnosis not present

## 2015-05-15 NOTE — Discharge Summary (Signed)
Physician Discharge Summary   Patient ID: Stacey Jefferson MRN: [id number] DOB/AGE: 79/03/1925 79 y.o.  Admit date: 05/10/2015 Discharge date: 05/12/2015  Primary Diagnosis: Primary osteoarthritis, both knees   Admission Diagnoses:  Past Medical History  Diagnosis Date  . Weight loss   . Vomiting   . Dysphagia, unspecified(787.20)   . Memory loss   . Hypotension, unspecified   . Irritable bowel syndrome   . Restless legs syndrome (RLS)   . Carpal tunnel syndrome   . Abnormality of gait   . Headache   . Pain in limb   . Polyneuropathy in other diseases classified elsewhere   . Hypertension   . GERD (gastroesophageal reflux disease)   . Arthritis   . Cancer     skin cancer left cheek with Mohrs   Discharge Diagnoses:   Active Problems:   History of total knee arthroplasty  Estimated body mass index is 27.22 kg/(m^2) as calculated from the following:   Height as of this encounter: _0  (1.549 m).   Weight as of this encounter: 65.318 kg (144 lb).  Procedure:  Procedure(s) (LRB): TOTAL RIGHT  KNEE ARTHROPLASTY ;   LEFT KNEE STEROID INJECTION  (Bilateral)   Consults: None  HPI: Stacey Jefferson, 81 y.o. female, has a history of pain and functional disability in the right knee due to arthritis and has failed non-surgical conservative treatments for greater than 12 weeks to includeNSAID's and/or analgesics, corticosteriod injections, viscosupplementation injections, use of assistive devices and activity modification. Onset of symptoms was gradual, starting >10 years ago with gradually worsening course since that time. The patient noted prior procedures on the knee to include arthroscopy and menisectomy on the right knee(s). Patient currently rates pain in the right knee(s) at 8 out of 10 with activity. Patient has night pain, worsening of pain with activity and weight bearing, pain that interferes with activities of daily living, pain with passive range of motion, crepitus and joint  swelling. Patient has evidence of periarticular osteophytes and joint space narrowing by imaging studies. There is no active infection.  Laboratory Data: Admission on 05/10/2015, Discharged on 05/12/2015  Component Date Value Ref Range Status  . WBC 05/11/2015 9.7  4.0 - 10.5 K/uL Final  . RBC 05/11/2015 3.54* 3.87 - 5.11 MIL/uL Final  . Hemoglobin 05/11/2015 10.6* 12.0 - 15.0 g/dL Final  . HCT 05/11/2015 32.9* 36.0 - 46.0 % Final  . MCV 05/11/2015 92.9  78.0 - 100.0 fL Final  . MCH 05/11/2015 29.9  26.0 - 34.0 pg Final  . MCHC 05/11/2015 32.2  30.0 - 36.0 g/dL Final  . RDW 05/11/2015 12.9  11.5 - 15.5 % Final  . Platelets 05/11/2015 112* 150 - 400 K/uL Final   Comment: SPECIMEN CHECKED FOR CLOTS PLATELET COUNT CONFIRMED BY SMEAR   . Sodium 05/11/2015 139  135 - 145 mmol/L Final  . Potassium 05/11/2015 4.3  3.5 - 5.1 mmol/L Final  . Chloride 05/11/2015 106  101 - 111 mmol/L Final  . CO2 05/11/2015 26  22 - 32 mmol/L Final  . Glucose, Bld 05/11/2015 151* 65 - 99 mg/dL Final  . BUN 05/11/2015 13  6 - 20 mg/dL Final  . Creatinine, Ser 05/11/2015 0.66  0.44 - 1.00 mg/dL Final  . Calcium 05/11/2015 8.5* 8.9 - 10.3 mg/dL Final  . GFR calc non Af Amer 05/11/2015 >60  >60 mL/min Final  . GFR calc Af Amer 05/11/2015 >60  >60 mL/min Final   Comment: (NOTE) The eGFR has  been calculated using the CKD EPI equation. This calculation has not been validated in all clinical situations. eGFR's persistently <60 mL/min signify possible Chronic Kidney Disease.   . Anion gap 05/11/2015 7  5 - 15 Final  . WBC 05/12/2015 8.5  4.0 - 10.5 K/uL Final  . RBC 05/12/2015 2.93* 3.87 - 5.11 MIL/uL Final  . Hemoglobin 05/12/2015 9.4* 12.0 - 15.0 g/dL Final  . HCT 05/12/2015 27.9* 36.0 - 46.0 % Final  . MCV 05/12/2015 95.2  78.0 - 100.0 fL Final  . MCH 05/12/2015 32.1  26.0 - 34.0 pg Final  . MCHC 05/12/2015 33.7  30.0 - 36.0 g/dL Final  . RDW 05/12/2015 13.3  11.5 - 15.5 % Final  . Platelets 05/12/2015  118* 150 - 400 K/uL Final   Comment: CONSISTENT WITH PREVIOUS RESULT RESULT REPEATED AND VERIFIED   . Sodium 05/12/2015 139  135 - 145 mmol/L Final  . Potassium 05/12/2015 4.4  3.5 - 5.1 mmol/L Final  . Chloride 05/12/2015 107  101 - 111 mmol/L Final  . CO2 05/12/2015 29  22 - 32 mmol/L Final  . Glucose, Bld 05/12/2015 114* 65 - 99 mg/dL Final  . BUN 05/12/2015 14  6 - 20 mg/dL Final  . Creatinine, Ser 05/12/2015 0.65  0.44 - 1.00 mg/dL Final  . Calcium 05/12/2015 8.7* 8.9 - 10.3 mg/dL Final  . GFR calc non Af Amer 05/12/2015 >60  >60 mL/min Final  . GFR calc Af Amer 05/12/2015 >60  >60 mL/min Final   Comment: (NOTE) The eGFR has been calculated using the CKD EPI equation. This calculation has not been validated in all clinical situations. eGFR's persistently <60 mL/min signify possible Chronic Kidney Disease.   Georgiann Hahn gap 05/12/2015 3* 5 - 15 Final  Hospital Outpatient Visit on 05/02/2015  Component Date Value Ref Range Status  . ABO/RH(D) 05/02/2015 B POS   Final  Hospital Outpatient Visit on 05/02/2015  Component Date Value Ref Range Status  . aPTT 05/02/2015 32  24 - 37 seconds Final  . WBC 05/02/2015 5.8  4.0 - 10.5 K/uL Final  . RBC 05/02/2015 4.11  3.87 - 5.11 MIL/uL Final  . Hemoglobin 05/02/2015 12.9  12.0 - 15.0 g/dL Final  . HCT 05/02/2015 38.8  36.0 - 46.0 % Final  . MCV 05/02/2015 94.4  78.0 - 100.0 fL Final  . MCH 05/02/2015 31.4  26.0 - 34.0 pg Final  . MCHC 05/02/2015 33.2  30.0 - 36.0 g/dL Final  . RDW 05/02/2015 12.9  11.5 - 15.5 % Final  . Platelets 05/02/2015 145* 150 - 400 K/uL Final  . Neutrophils Relative % 05/02/2015 48  43 - 77 % Final  . Neutro Abs 05/02/2015 2.8  1.7 - 7.7 K/uL Final  . Lymphocytes Relative 05/02/2015 45  12 - 46 % Final  . Lymphs Abs 05/02/2015 2.6  0.7 - 4.0 K/uL Final  . Monocytes Relative 05/02/2015 4  3 - 12 % Final  . Monocytes Absolute 05/02/2015 0.3  0.1 - 1.0 K/uL Final  . Eosinophils Relative 05/02/2015 2  0 - 5 % Final    . Eosinophils Absolute 05/02/2015 0.1  0.0 - 0.7 K/uL Final  . Basophils Relative 05/02/2015 1  0 - 1 % Final  . Basophils Absolute 05/02/2015 0.0  0.0 - 0.1 K/uL Final  . Sodium 05/02/2015 141  135 - 145 mmol/L Final  . Potassium 05/02/2015 3.6  3.5 - 5.1 mmol/L Final  . Chloride 05/02/2015 105  101 -  111 mmol/L Final  . CO2 05/02/2015 30  22 - 32 mmol/L Final  . Glucose, Bld 05/02/2015 128* 65 - 99 mg/dL Final  . BUN 05/02/2015 12  6 - 20 mg/dL Final  . Creatinine, Ser 05/02/2015 0.76  0.44 - 1.00 mg/dL Final  . Calcium 05/02/2015 9.1  8.9 - 10.3 mg/dL Final  . Total Protein 05/02/2015 6.9  6.5 - 8.1 g/dL Final  . Albumin 05/02/2015 3.9  3.5 - 5.0 g/dL Final  . AST 05/02/2015 14* 15 - 41 U/L Final  . ALT 05/02/2015 7* 14 - 54 U/L Final  . Alkaline Phosphatase 05/02/2015 52  38 - 126 U/L Final  . Total Bilirubin 05/02/2015 0.7  0.3 - 1.2 mg/dL Final  . GFR calc non Af Amer 05/02/2015 >60  >60 mL/min Final  . GFR calc Af Amer 05/02/2015 >60  >60 mL/min Final   Comment: (NOTE) The eGFR has been calculated using the CKD EPI equation. This calculation has not been validated in all clinical situations. eGFR's persistently <60 mL/min signify possible Chronic Kidney Disease.   . Anion gap 05/02/2015 6  5 - 15 Final  . Prothrombin Time 05/02/2015 14.4  11.6 - 15.2 seconds Final  . INR 05/02/2015 1.10  0.00 - 1.49 Final  . ABO/RH(D) 05/02/2015 B POS   Final  . Antibody Screen 05/02/2015 NEG   Final  . Sample Expiration 05/02/2015 05/13/2015   Final  . MRSA, PCR 05/02/2015 NEGATIVE  NEGATIVE Final  . Staphylococcus aureus 05/02/2015 NEGATIVE  NEGATIVE Final   Comment:        The Xpert SA Assay (FDA approved for NASAL specimens in patients over 36 years of age), is one component of a comprehensive surveillance program.  Test performance has been validated by [facility] for patients greater than or equal to 80 year old. It is not intended to diagnose infection nor to guide or  monitor treatment.      X-Rays:Dg Chest 2 View  05/02/2015   CLINICAL DATA:  Preoperative examination prior to right total knee replacement, previous history of tobacco use.  EXAM: CHEST  2 VIEW  COMPARISON:  Chest x-ray of September 16, 2013  FINDINGS: The lungs are adequately inflated. There is no focal infiltrate or pleural effusion. The heart and pulmonary vascularity are normal. The mediastinum is normal in width. There is mild tortuosity of the descending thoracic aorta. There is prominent curvature of the lumbar spine convex towards the left. There degenerative changes of the shoulders.  IMPRESSION: Mild hyperinflation which may be voluntary or could reflect underlying COPD. There is no active cardiopulmonary disease.   Electronically Signed   By: David  Martinique M.D.   On: 05/02/2015 16:22     Hospital Course: Stacey Jefferson is a 81 y.o. who was admitted to [facility]. They were brought to the operating room on 05/10/2015 and underwent Procedure(s): TOTAL RIGHT  KNEE ARTHROPLASTY ;   LEFT KNEE STEROID INJECTION .  Patient tolerated the procedure well and was later transferred to the recovery room and then to the orthopaedic floor for postoperative care.  They were given PO and IV analgesics for pain control following their surgery.  They were given 24 hours of postoperative antibiotics of  Anti-infectives    Start     Dose/Rate Route Frequency Ordered Stop   05/10/15 1500  ceFAZolin (ANCEF) IVPB 1 g/50 mL premix     1 g 100 mL/hr over 30 Minutes Intravenous Every 6 hours 05/10/15 1231  05/10/15 2139   05/10/15 0634  ceFAZolin (ANCEF) IVPB 2 g/50 mL premix     2 g 100 mL/hr over 30 Minutes Intravenous On call to O.R. 05/10/15 3664 05/10/15 0840   05/10/15 0000  polymyxin B 500,000 Units, bacitracin 50,000 Units in sodium chloride irrigation 0.9 % 500 mL irrigation  Status:  Discontinued       As needed 05/10/15 0929 05/10/15 1116     and started on DVT prophylaxis in the form of  Xarelto.   PT and OT were ordered for total joint protocol.  Discharge planning consulted to help with postop disposition and equipment needs.  Patient had a fair night on the evening of surgery.  They started to get up OOB with therapy on day one. Hemovac drain was pulled without difficulty.  Continued to work with therapy into day two. The patient had progressed with therapy and meeting their goals. Patient was seen in rounds and was ready to go home.   Diet: Cardiac diet Activity:WBAT Follow-up:in 2 weeks Disposition - Home Discharged Condition: stable   Discharge Instructions    Call MD / Call 911    Complete by:  As directed   If you experience chest pain or shortness of breath, CALL 911 and be transported to the hospital emergency room.  If you develope a fever above 101 F, pus (white drainage) or increased drainage or redness at the wound, or calf pain, call your surgeon's office.     Constipation Prevention    Complete by:  As directed   Drink plenty of fluids.  Prune juice may be helpful.  You may use a stool softener, such as Colace (over the counter) 100 mg twice a day.  Use MiraLax (over the counter) for constipation as needed.     Diet - low sodium heart healthy    Complete by:  As directed      Discharge instructions    Complete by:  As directed   INSTRUCTIONS AFTER JOINT REPLACEMENT   Remove items at home which could result in a fall. This includes throw rugs or furniture in walking pathways ICE to the affected joint every three hours while awake for 30 minutes at a time, for at least the first 3-5 days, and then as needed for pain and swelling.  Continue to use ice for pain and swelling. You may notice swelling that will progress down to the foot and ankle.  This is normal after surgery.  Elevate your leg when you are not up walking on it.   Continue to use the breathing machine you got in the hospital (incentive spirometer) which will help keep your temperature down.  It is  common for your temperature to cycle up and down following surgery, especially at night when you are not up moving around and exerting yourself.  The breathing machine keeps your lungs expanded and your temperature down.   DIET:  As you were doing prior to hospitalization, we recommend a well-balanced diet.  DRESSING / WOUND CARE / SHOWERING  You may shower 3 days after surgery, but keep the wounds dry during showering.  You may use an occlusive plastic wrap (Press'n Seal for example), NO SOAKING/SUBMERGING IN THE BATHTUB.  If the bandage gets wet, change with a clean dry gauze.  If the incision gets wet, pat the wound dry with a clean towel.  ACTIVITY  Increase activity slowly as tolerated, but follow the weight bearing instructions below.   No driving for 6  weeks or until further direction given by your physician.  You cannot drive while taking narcotics.  No lifting or carrying greater than 10 lbs. until further directed by your surgeon. Avoid periods of inactivity such as sitting longer than an hour when not asleep. This helps prevent blood clots.  You may return to work once you are authorized by your doctor.     WEIGHT BEARING   Weight bearing as tolerated with assist device (walker, cane, etc) as directed, use it as long as suggested by your surgeon or therapist, typically at least 4-6 weeks.   EXERCISES  Results after joint replacement surgery are often greatly improved when you follow the exercise, range of motion and muscle strengthening exercises prescribed by your doctor. Safety measures are also important to protect the joint from further injury. Any time any of these exercises cause you to have increased pain or swelling, decrease what you are doing until you are comfortable again and then slowly increase them. If you have problems or questions, call your caregiver or physical therapist for advice.   Rehabilitation is important following a joint replacement. After just a few  days of immobilization, the muscles of the leg can become weakened and shrink (atrophy).  These exercises are designed to build up the tone and strength of the thigh and leg muscles and to improve motion. Often times heat used for twenty to thirty minutes before working out will loosen up your tissues and help with improving the range of motion but do not use heat for the first two weeks following surgery (sometimes heat can increase post-operative swelling).   These exercises can be done on a training (exercise) mat, on the floor, on a table or on a bed. Use whatever works the best and is most comfortable for you.    Use music or television while you are exercising so that the exercises are a pleasant break in your day. This will make your life better with the exercises acting as a break in your routine that you can look forward to.   Perform all exercises about fifteen times, three times per day or as directed.  You should exercise both the operative leg and the other leg as well.   Exercises include:   Quad Sets - Tighten up the muscle on the front of the thigh (Quad) and hold for 5-10 seconds.   Straight Leg Raises - With your knee straight (if you were given a brace, keep it on), lift the leg to 60 degrees, hold for 3 seconds, and slowly lower the leg.  Perform this exercise against resistance later as your leg gets stronger.  Leg Slides: Lying on your back, slowly slide your foot toward your buttocks, bending your knee up off the floor (only go as far as is comfortable). Then slowly slide your foot back down until your leg is flat on the floor again.  Angel Wings: Lying on your back spread your legs to the side as far apart as you can without causing discomfort.  Hamstring Strength:  Lying on your back, push your heel against the floor with your leg straight by tightening up the muscles of your buttocks.  Repeat, but this time bend your knee to a comfortable angle, and push your heel against the  floor.  You may put a pillow under the heel to make it more comfortable if necessary.   A rehabilitation program following joint replacement surgery can speed recovery and prevent re-injury in the future due  to weakened muscles. Contact your doctor or a physical therapist for more information on knee rehabilitation.    CONSTIPATION  Constipation is defined medically as fewer than three stools per week and severe constipation as less than one stool per week.  Even if you have a regular bowel pattern at home, your normal regimen is likely to be disrupted due to multiple reasons following surgery.  Combination of anesthesia, postoperative narcotics, change in appetite and fluid intake all can affect your bowels.   YOU MUST use at least one of the following options; they are listed in order of increasing strength to get the job done.  They are all available over the counter, and you may need to use some, POSSIBLY even all of these options:    Drink plenty of fluids (prune juice may be helpful) and high fiber foods Colace 100 mg by mouth twice a day  Senokot for constipation as directed and as needed Dulcolax (bisacodyl), take with full glass of water  Miralax (polyethylene glycol) once or twice a day as needed.  If you have tried all these things and are unable to have a bowel movement in the first 3-4 days after surgery call either your surgeon or your primary doctor.    If you experience loose stools or diarrhea, hold the medications until you stool forms back up.  If your symptoms do not get better within 1 week or if they get worse, check with your doctor.  If you experience "the worst abdominal pain ever" or develop nausea or vomiting, please contact the office immediately for further recommendations for treatment.   ITCHING:  If you experience itching with your medications, try taking only a single pain pill, or even half a pain pill at a time.  You can also use Benadryl over the counter for  itching or also to help with sleep.   TED HOSE STOCKINGS:  Use stockings on both legs until for at least 2 weeks or as directed by physician office. They may be removed at night for sleeping.  MEDICATIONS:  See your medication summary on the "After Visit Summary" that nursing will review with you.  You may have some home medications which will be placed on hold until you complete the course of blood thinner medication.  It is important for you to complete the blood thinner medication as prescribed.  PRECAUTIONS:  If you experience chest pain or shortness of breath - call 911 immediately for transfer to the hospital emergency department.   If you develop a fever greater that 101 F, purulent drainage from wound, increased redness or drainage from wound, foul odor from the wound/dressing, or calf pain - CONTACT YOUR SURGEON.                                                   FOLLOW-UP APPOINTMENTS:  If you do not already have a post-op appointment, please call the office for an appointment to be seen by your surgeon.  Guidelines for how soon to be seen are listed in your "After Visit Summary", but are typically between 1-4 weeks after surgery.   MAKE SURE YOU:  Understand these instructions.  Get help right away if you are not doing well or get worse.    Thank you for letting us be a part of your medical care team.  It is a privilege we respect greatly.  We hope these instructions will help you stay on track for a fast and full recovery!     Increase activity slowly as tolerated    Complete by:  As directed             Medication List    TAKE these medications        acetaminophen 160 MG/5ML solution  Commonly known as:  TYLENOL  Take 20.3 mLs (650 mg total) by mouth every 6 (six) hours as needed for mild pain, headache or fever.     amitriptyline 10 MG tablet  Commonly known as:  ELAVIL  Take 1 tablet (10 mg total) by mouth at bedtime.     bisoprolol 5 MG tablet  Commonly known  as:  ZEBETA  Take 5 mg by mouth at bedtime.     carbidopa-levodopa 25-100 MG per tablet  Commonly known as:  SINEMET  Take 1 tablet by mouth 3 (three) times daily.     clonazePAM 0.5 MG tablet  Commonly known as:  KLONOPIN  Take 1 tablet (0.5 mg total) by mouth at bedtime as needed.     donepezil 10 MG tablet  Commonly known as:  ARICEPT  Take 1 tablet (10 mg total) by mouth daily.     liver oil-zinc oxide 40 % ointment  Commonly known as:  DESITIN  Apply 1 application topically as needed for irritation (to bottom after each BM-using a generic ' Butt paste w zinc").     loperamide 1 MG/5ML solution  Commonly known as:  IMODIUM  Take 2 mg by mouth as needed for diarrhea or loose stools.     loratadine 10 MG tablet  Commonly known as:  CLARITIN  Take 10 mg by mouth daily.     methocarbamol 500 MG tablet  Commonly known as:  ROBAXIN  Take 1 tablet (500 mg total) by mouth every 6 (six) hours as needed for muscle spasms.     omeprazole 20 MG capsule  Commonly known as:  PRILOSEC  Take 20 mg by mouth 2 (two) times daily. Opens capsule and mixes in with food or water     rivaroxaban 10 MG Tabs tablet  Commonly known as:  XARELTO  Take 1 tablet (10 mg total) by mouth daily with breakfast.     SALACYN 6 % Lotn  Generic drug:  Salicylic Acid  Apply 1 application topically as needed (for legs).     SYSTANE OP  Apply to eye as needed.     traMADol 50 MG tablet  Commonly known as:  ULTRAM  Take 1-2 tablets (50-100 mg total) by mouth every 6 (six) hours as needed for moderate pain or severe pain.     VITAMIN B 12 PO  Take 1 tablet by mouth daily.           Follow-up Information    Follow up with GIOFFRE,RONALD A, MD. Schedule an appointment as soon as possible for a visit in 2 weeks.   Specialty:  Orthopedic Surgery   Contact information:   [address] [place] [address] [phone]       Signed: Ardeen Jourdain, PA-C Orthopaedic  Surgery 05/15/2015, 8:38 AM

## 2015-05-16 DIAGNOSIS — Z96651 Presence of right artificial knee joint: Secondary | ICD-10-CM | POA: Diagnosis not present

## 2015-05-16 DIAGNOSIS — Z471 Aftercare following joint replacement surgery: Secondary | ICD-10-CM | POA: Diagnosis not present

## 2015-05-17 DIAGNOSIS — M1990 Unspecified osteoarthritis, unspecified site: Secondary | ICD-10-CM | POA: Diagnosis not present

## 2015-05-17 DIAGNOSIS — G629 Polyneuropathy, unspecified: Secondary | ICD-10-CM | POA: Diagnosis not present

## 2015-05-17 DIAGNOSIS — H9193 Unspecified hearing loss, bilateral: Secondary | ICD-10-CM | POA: Diagnosis not present

## 2015-05-17 DIAGNOSIS — Z96651 Presence of right artificial knee joint: Secondary | ICD-10-CM | POA: Diagnosis not present

## 2015-05-17 DIAGNOSIS — G2581 Restless legs syndrome: Secondary | ICD-10-CM | POA: Diagnosis not present

## 2015-05-17 DIAGNOSIS — Z471 Aftercare following joint replacement surgery: Secondary | ICD-10-CM | POA: Diagnosis not present

## 2015-05-18 DIAGNOSIS — Z96651 Presence of right artificial knee joint: Secondary | ICD-10-CM | POA: Diagnosis not present

## 2015-05-18 DIAGNOSIS — H9193 Unspecified hearing loss, bilateral: Secondary | ICD-10-CM | POA: Diagnosis not present

## 2015-05-18 DIAGNOSIS — G2581 Restless legs syndrome: Secondary | ICD-10-CM | POA: Diagnosis not present

## 2015-05-18 DIAGNOSIS — G629 Polyneuropathy, unspecified: Secondary | ICD-10-CM | POA: Diagnosis not present

## 2015-05-18 DIAGNOSIS — M1990 Unspecified osteoarthritis, unspecified site: Secondary | ICD-10-CM | POA: Diagnosis not present

## 2015-05-18 DIAGNOSIS — Z471 Aftercare following joint replacement surgery: Secondary | ICD-10-CM | POA: Diagnosis not present

## 2015-05-19 DIAGNOSIS — M1990 Unspecified osteoarthritis, unspecified site: Secondary | ICD-10-CM | POA: Diagnosis not present

## 2015-05-19 DIAGNOSIS — H9193 Unspecified hearing loss, bilateral: Secondary | ICD-10-CM | POA: Diagnosis not present

## 2015-05-19 DIAGNOSIS — G629 Polyneuropathy, unspecified: Secondary | ICD-10-CM | POA: Diagnosis not present

## 2015-05-19 DIAGNOSIS — G2581 Restless legs syndrome: Secondary | ICD-10-CM | POA: Diagnosis not present

## 2015-05-19 DIAGNOSIS — Z96651 Presence of right artificial knee joint: Secondary | ICD-10-CM | POA: Diagnosis not present

## 2015-05-19 DIAGNOSIS — Z471 Aftercare following joint replacement surgery: Secondary | ICD-10-CM | POA: Diagnosis not present

## 2015-05-22 DIAGNOSIS — H9193 Unspecified hearing loss, bilateral: Secondary | ICD-10-CM | POA: Diagnosis not present

## 2015-05-22 DIAGNOSIS — G629 Polyneuropathy, unspecified: Secondary | ICD-10-CM | POA: Diagnosis not present

## 2015-05-22 DIAGNOSIS — M1990 Unspecified osteoarthritis, unspecified site: Secondary | ICD-10-CM | POA: Diagnosis not present

## 2015-05-22 DIAGNOSIS — Z96651 Presence of right artificial knee joint: Secondary | ICD-10-CM | POA: Diagnosis not present

## 2015-05-22 DIAGNOSIS — Z471 Aftercare following joint replacement surgery: Secondary | ICD-10-CM | POA: Diagnosis not present

## 2015-05-22 DIAGNOSIS — G2581 Restless legs syndrome: Secondary | ICD-10-CM | POA: Diagnosis not present

## 2015-05-23 DIAGNOSIS — Z96651 Presence of right artificial knee joint: Secondary | ICD-10-CM | POA: Diagnosis not present

## 2015-05-23 DIAGNOSIS — G2581 Restless legs syndrome: Secondary | ICD-10-CM | POA: Diagnosis not present

## 2015-05-23 DIAGNOSIS — M1990 Unspecified osteoarthritis, unspecified site: Secondary | ICD-10-CM | POA: Diagnosis not present

## 2015-05-23 DIAGNOSIS — Z471 Aftercare following joint replacement surgery: Secondary | ICD-10-CM | POA: Diagnosis not present

## 2015-05-23 DIAGNOSIS — H9193 Unspecified hearing loss, bilateral: Secondary | ICD-10-CM | POA: Diagnosis not present

## 2015-05-23 DIAGNOSIS — G629 Polyneuropathy, unspecified: Secondary | ICD-10-CM | POA: Diagnosis not present

## 2015-05-24 DIAGNOSIS — H9193 Unspecified hearing loss, bilateral: Secondary | ICD-10-CM | POA: Diagnosis not present

## 2015-05-24 DIAGNOSIS — G629 Polyneuropathy, unspecified: Secondary | ICD-10-CM | POA: Diagnosis not present

## 2015-05-24 DIAGNOSIS — G2581 Restless legs syndrome: Secondary | ICD-10-CM | POA: Diagnosis not present

## 2015-05-24 DIAGNOSIS — Z471 Aftercare following joint replacement surgery: Secondary | ICD-10-CM | POA: Diagnosis not present

## 2015-05-24 DIAGNOSIS — M1990 Unspecified osteoarthritis, unspecified site: Secondary | ICD-10-CM | POA: Diagnosis not present

## 2015-05-24 DIAGNOSIS — Z96651 Presence of right artificial knee joint: Secondary | ICD-10-CM | POA: Diagnosis not present

## 2015-05-25 DIAGNOSIS — G2581 Restless legs syndrome: Secondary | ICD-10-CM | POA: Diagnosis not present

## 2015-05-25 DIAGNOSIS — M1990 Unspecified osteoarthritis, unspecified site: Secondary | ICD-10-CM | POA: Diagnosis not present

## 2015-05-25 DIAGNOSIS — Z96651 Presence of right artificial knee joint: Secondary | ICD-10-CM | POA: Diagnosis not present

## 2015-05-25 DIAGNOSIS — G629 Polyneuropathy, unspecified: Secondary | ICD-10-CM | POA: Diagnosis not present

## 2015-05-25 DIAGNOSIS — Z471 Aftercare following joint replacement surgery: Secondary | ICD-10-CM | POA: Diagnosis not present

## 2015-05-25 DIAGNOSIS — H9193 Unspecified hearing loss, bilateral: Secondary | ICD-10-CM | POA: Diagnosis not present

## 2015-05-26 DIAGNOSIS — M1990 Unspecified osteoarthritis, unspecified site: Secondary | ICD-10-CM | POA: Diagnosis not present

## 2015-05-26 DIAGNOSIS — Z471 Aftercare following joint replacement surgery: Secondary | ICD-10-CM | POA: Diagnosis not present

## 2015-05-26 DIAGNOSIS — H9193 Unspecified hearing loss, bilateral: Secondary | ICD-10-CM | POA: Diagnosis not present

## 2015-05-26 DIAGNOSIS — Z96651 Presence of right artificial knee joint: Secondary | ICD-10-CM | POA: Diagnosis not present

## 2015-05-26 DIAGNOSIS — G629 Polyneuropathy, unspecified: Secondary | ICD-10-CM | POA: Diagnosis not present

## 2015-05-26 DIAGNOSIS — G2581 Restless legs syndrome: Secondary | ICD-10-CM | POA: Diagnosis not present

## 2015-05-29 DIAGNOSIS — H9193 Unspecified hearing loss, bilateral: Secondary | ICD-10-CM | POA: Diagnosis not present

## 2015-05-29 DIAGNOSIS — G629 Polyneuropathy, unspecified: Secondary | ICD-10-CM | POA: Diagnosis not present

## 2015-05-29 DIAGNOSIS — M1990 Unspecified osteoarthritis, unspecified site: Secondary | ICD-10-CM | POA: Diagnosis not present

## 2015-05-29 DIAGNOSIS — Z471 Aftercare following joint replacement surgery: Secondary | ICD-10-CM | POA: Diagnosis not present

## 2015-05-29 DIAGNOSIS — G2581 Restless legs syndrome: Secondary | ICD-10-CM | POA: Diagnosis not present

## 2015-05-29 DIAGNOSIS — Z96651 Presence of right artificial knee joint: Secondary | ICD-10-CM | POA: Diagnosis not present

## 2015-05-30 ENCOUNTER — Telehealth: Payer: Self-pay | Admitting: Neurology

## 2015-05-30 ENCOUNTER — Other Ambulatory Visit: Payer: Self-pay | Admitting: *Deleted

## 2015-05-30 DIAGNOSIS — G2581 Restless legs syndrome: Secondary | ICD-10-CM | POA: Diagnosis not present

## 2015-05-30 DIAGNOSIS — H9193 Unspecified hearing loss, bilateral: Secondary | ICD-10-CM | POA: Diagnosis not present

## 2015-05-30 DIAGNOSIS — M1990 Unspecified osteoarthritis, unspecified site: Secondary | ICD-10-CM | POA: Diagnosis not present

## 2015-05-30 DIAGNOSIS — Z96651 Presence of right artificial knee joint: Secondary | ICD-10-CM | POA: Diagnosis not present

## 2015-05-30 DIAGNOSIS — Z471 Aftercare following joint replacement surgery: Secondary | ICD-10-CM | POA: Diagnosis not present

## 2015-05-30 DIAGNOSIS — G629 Polyneuropathy, unspecified: Secondary | ICD-10-CM | POA: Diagnosis not present

## 2015-05-30 MED ORDER — DONEPEZIL HCL 10 MG PO TABS: 10.0000 mg | ORAL_TABLET | Freq: Every day | ORAL | Status: DC

## 2015-05-30 NOTE — Telephone Encounter (Signed)
I spoke with [name] (person with patient at PT) and she said that it was ok to send in 90 day supply to [facility] on [address].

## 2015-05-30 NOTE — Telephone Encounter (Signed)
Pt called for a refill of "Donepezil" prescription/is requesting a 14 day supply/sent to the [facility] on [address]/ call back @ [phone]

## 2015-05-31 DIAGNOSIS — Z471 Aftercare following joint replacement surgery: Secondary | ICD-10-CM | POA: Diagnosis not present

## 2015-05-31 DIAGNOSIS — Z96651 Presence of right artificial knee joint: Secondary | ICD-10-CM | POA: Diagnosis not present

## 2015-05-31 DIAGNOSIS — H9193 Unspecified hearing loss, bilateral: Secondary | ICD-10-CM | POA: Diagnosis not present

## 2015-05-31 DIAGNOSIS — G629 Polyneuropathy, unspecified: Secondary | ICD-10-CM | POA: Diagnosis not present

## 2015-05-31 DIAGNOSIS — G2581 Restless legs syndrome: Secondary | ICD-10-CM | POA: Diagnosis not present

## 2015-05-31 DIAGNOSIS — M1990 Unspecified osteoarthritis, unspecified site: Secondary | ICD-10-CM | POA: Diagnosis not present

## 2015-06-05 DIAGNOSIS — G629 Polyneuropathy, unspecified: Secondary | ICD-10-CM | POA: Diagnosis not present

## 2015-06-05 DIAGNOSIS — Z471 Aftercare following joint replacement surgery: Secondary | ICD-10-CM | POA: Diagnosis not present

## 2015-06-05 DIAGNOSIS — H9193 Unspecified hearing loss, bilateral: Secondary | ICD-10-CM | POA: Diagnosis not present

## 2015-06-05 DIAGNOSIS — Z96651 Presence of right artificial knee joint: Secondary | ICD-10-CM | POA: Diagnosis not present

## 2015-06-05 DIAGNOSIS — M1990 Unspecified osteoarthritis, unspecified site: Secondary | ICD-10-CM | POA: Diagnosis not present

## 2015-06-05 DIAGNOSIS — G2581 Restless legs syndrome: Secondary | ICD-10-CM | POA: Diagnosis not present

## 2015-06-06 DIAGNOSIS — G2581 Restless legs syndrome: Secondary | ICD-10-CM | POA: Diagnosis not present

## 2015-06-06 DIAGNOSIS — Z471 Aftercare following joint replacement surgery: Secondary | ICD-10-CM | POA: Diagnosis not present

## 2015-06-06 DIAGNOSIS — M1990 Unspecified osteoarthritis, unspecified site: Secondary | ICD-10-CM | POA: Diagnosis not present

## 2015-06-06 DIAGNOSIS — H9193 Unspecified hearing loss, bilateral: Secondary | ICD-10-CM | POA: Diagnosis not present

## 2015-06-06 DIAGNOSIS — Z96651 Presence of right artificial knee joint: Secondary | ICD-10-CM | POA: Diagnosis not present

## 2015-06-06 DIAGNOSIS — G629 Polyneuropathy, unspecified: Secondary | ICD-10-CM | POA: Diagnosis not present

## 2015-06-08 DIAGNOSIS — Z96651 Presence of right artificial knee joint: Secondary | ICD-10-CM | POA: Diagnosis not present

## 2015-06-08 DIAGNOSIS — Z471 Aftercare following joint replacement surgery: Secondary | ICD-10-CM | POA: Diagnosis not present

## 2015-06-09 DIAGNOSIS — M1711 Unilateral primary osteoarthritis, right knee: Secondary | ICD-10-CM | POA: Diagnosis not present

## 2015-06-13 DIAGNOSIS — M1711 Unilateral primary osteoarthritis, right knee: Secondary | ICD-10-CM | POA: Diagnosis not present

## 2015-06-16 DIAGNOSIS — M1711 Unilateral primary osteoarthritis, right knee: Secondary | ICD-10-CM | POA: Diagnosis not present

## 2015-06-20 DIAGNOSIS — M1711 Unilateral primary osteoarthritis, right knee: Secondary | ICD-10-CM | POA: Diagnosis not present

## 2015-06-22 DIAGNOSIS — M1711 Unilateral primary osteoarthritis, right knee: Secondary | ICD-10-CM | POA: Diagnosis not present

## 2015-06-26 DIAGNOSIS — Z96651 Presence of right artificial knee joint: Secondary | ICD-10-CM | POA: Diagnosis not present

## 2015-06-26 DIAGNOSIS — Z471 Aftercare following joint replacement surgery: Secondary | ICD-10-CM | POA: Diagnosis not present

## 2015-06-26 DIAGNOSIS — M1712 Unilateral primary osteoarthritis, left knee: Secondary | ICD-10-CM | POA: Diagnosis not present

## 2015-06-27 DIAGNOSIS — M1711 Unilateral primary osteoarthritis, right knee: Secondary | ICD-10-CM | POA: Diagnosis not present

## 2015-06-29 DIAGNOSIS — M1711 Unilateral primary osteoarthritis, right knee: Secondary | ICD-10-CM | POA: Diagnosis not present

## 2015-07-04 DIAGNOSIS — M1711 Unilateral primary osteoarthritis, right knee: Secondary | ICD-10-CM | POA: Diagnosis not present

## 2015-07-07 DIAGNOSIS — M1711 Unilateral primary osteoarthritis, right knee: Secondary | ICD-10-CM | POA: Diagnosis not present

## 2015-07-11 DIAGNOSIS — M1711 Unilateral primary osteoarthritis, right knee: Secondary | ICD-10-CM | POA: Diagnosis not present

## 2015-07-12 DIAGNOSIS — M1712 Unilateral primary osteoarthritis, left knee: Secondary | ICD-10-CM | POA: Diagnosis not present

## 2015-07-14 DIAGNOSIS — M1711 Unilateral primary osteoarthritis, right knee: Secondary | ICD-10-CM | POA: Diagnosis not present

## 2015-07-18 DIAGNOSIS — M1711 Unilateral primary osteoarthritis, right knee: Secondary | ICD-10-CM | POA: Diagnosis not present

## 2015-07-20 DIAGNOSIS — M1712 Unilateral primary osteoarthritis, left knee: Secondary | ICD-10-CM | POA: Diagnosis not present

## 2015-07-21 DIAGNOSIS — M1711 Unilateral primary osteoarthritis, right knee: Secondary | ICD-10-CM | POA: Diagnosis not present

## 2015-07-24 ENCOUNTER — Telehealth: Payer: Self-pay | Admitting: Neurology

## 2015-07-24 NOTE — Telephone Encounter (Signed)
Called pharmacy and gave ok to give patient generic per Dr. Posey Pronto.

## 2015-07-24 NOTE — Telephone Encounter (Signed)
Amanda from  called in regards to Pt's medication klonopin and would like a call back at [phone]/Ref#[phone]

## 2015-07-25 DIAGNOSIS — M1711 Unilateral primary osteoarthritis, right knee: Secondary | ICD-10-CM | POA: Diagnosis not present

## 2015-07-28 DIAGNOSIS — M1711 Unilateral primary osteoarthritis, right knee: Secondary | ICD-10-CM | POA: Diagnosis not present

## 2015-07-31 DIAGNOSIS — M1712 Unilateral primary osteoarthritis, left knee: Secondary | ICD-10-CM | POA: Diagnosis not present

## 2015-08-08 DIAGNOSIS — K222 Esophageal obstruction: Secondary | ICD-10-CM | POA: Diagnosis not present

## 2015-08-08 DIAGNOSIS — G301 Alzheimer's disease with late onset: Secondary | ICD-10-CM | POA: Diagnosis not present

## 2015-08-08 DIAGNOSIS — Z23 Encounter for immunization: Secondary | ICD-10-CM | POA: Diagnosis not present

## 2015-08-08 DIAGNOSIS — F0390 Unspecified dementia without behavioral disturbance: Secondary | ICD-10-CM | POA: Diagnosis not present

## 2015-08-08 DIAGNOSIS — G2581 Restless legs syndrome: Secondary | ICD-10-CM | POA: Diagnosis not present

## 2015-08-08 DIAGNOSIS — I10 Essential (primary) hypertension: Secondary | ICD-10-CM | POA: Diagnosis not present

## 2015-08-10 DIAGNOSIS — R194 Change in bowel habit: Secondary | ICD-10-CM | POA: Diagnosis not present

## 2015-08-10 DIAGNOSIS — K200 Eosinophilic esophagitis: Secondary | ICD-10-CM | POA: Diagnosis not present

## 2015-08-10 DIAGNOSIS — K222 Esophageal obstruction: Secondary | ICD-10-CM | POA: Diagnosis not present

## 2015-08-10 DIAGNOSIS — R1310 Dysphagia, unspecified: Secondary | ICD-10-CM | POA: Diagnosis not present

## 2015-08-10 DIAGNOSIS — R634 Abnormal weight loss: Secondary | ICD-10-CM | POA: Diagnosis not present

## 2015-09-11 DIAGNOSIS — Z96651 Presence of right artificial knee joint: Secondary | ICD-10-CM | POA: Diagnosis not present

## 2015-09-11 DIAGNOSIS — M1712 Unilateral primary osteoarthritis, left knee: Secondary | ICD-10-CM | POA: Diagnosis not present

## 2015-09-11 DIAGNOSIS — Z471 Aftercare following joint replacement surgery: Secondary | ICD-10-CM | POA: Diagnosis not present

## 2015-09-25 DIAGNOSIS — I361 Nonrheumatic tricuspid (valve) insufficiency: Secondary | ICD-10-CM | POA: Diagnosis not present

## 2015-09-25 DIAGNOSIS — G2581 Restless legs syndrome: Secondary | ICD-10-CM | POA: Diagnosis not present

## 2015-09-25 DIAGNOSIS — D649 Anemia, unspecified: Secondary | ICD-10-CM | POA: Diagnosis present

## 2015-09-25 DIAGNOSIS — Z961 Presence of intraocular lens: Secondary | ICD-10-CM | POA: Diagnosis not present

## 2015-09-25 DIAGNOSIS — R451 Restlessness and agitation: Secondary | ICD-10-CM | POA: Diagnosis present

## 2015-09-25 DIAGNOSIS — I517 Cardiomegaly: Secondary | ICD-10-CM | POA: Diagnosis not present

## 2015-09-25 DIAGNOSIS — K580 Irritable bowel syndrome with diarrhea: Secondary | ICD-10-CM | POA: Diagnosis present

## 2015-09-25 DIAGNOSIS — G20 Parkinson's disease: Secondary | ICD-10-CM | POA: Diagnosis present

## 2015-09-25 DIAGNOSIS — Z515 Encounter for palliative care: Secondary | ICD-10-CM | POA: Diagnosis not present

## 2015-09-25 DIAGNOSIS — K589 Irritable bowel syndrome without diarrhea: Secondary | ICD-10-CM | POA: Diagnosis not present

## 2015-09-25 DIAGNOSIS — K223 Perforation of esophagus: Secondary | ICD-10-CM | POA: Diagnosis present

## 2015-09-25 DIAGNOSIS — H00019 Hordeolum externum unspecified eye, unspecified eyelid: Secondary | ICD-10-CM | POA: Diagnosis present

## 2015-09-25 DIAGNOSIS — I4891 Unspecified atrial fibrillation: Secondary | ICD-10-CM | POA: Diagnosis not present

## 2015-09-25 DIAGNOSIS — E440 Moderate protein-calorie malnutrition: Secondary | ICD-10-CM | POA: Diagnosis not present

## 2015-09-25 DIAGNOSIS — K222 Esophageal obstruction: Secondary | ICD-10-CM | POA: Diagnosis not present

## 2015-09-25 DIAGNOSIS — R000 Tachycardia, unspecified: Secondary | ICD-10-CM | POA: Diagnosis not present

## 2015-09-25 DIAGNOSIS — R197 Diarrhea, unspecified: Secondary | ICD-10-CM | POA: Diagnosis not present

## 2015-09-25 DIAGNOSIS — H1851 Endothelial corneal dystrophy: Secondary | ICD-10-CM | POA: Diagnosis not present

## 2015-09-25 DIAGNOSIS — G839 Paralytic syndrome, unspecified: Secondary | ICD-10-CM | POA: Diagnosis present

## 2015-09-25 DIAGNOSIS — E876 Hypokalemia: Secondary | ICD-10-CM | POA: Diagnosis not present

## 2015-09-25 DIAGNOSIS — Z4682 Encounter for fitting and adjustment of non-vascular catheter: Secondary | ICD-10-CM | POA: Diagnosis not present

## 2015-09-25 DIAGNOSIS — F0390 Unspecified dementia without behavioral disturbance: Secondary | ICD-10-CM | POA: Diagnosis present

## 2015-09-25 DIAGNOSIS — E46 Unspecified protein-calorie malnutrition: Secondary | ICD-10-CM | POA: Diagnosis not present

## 2015-09-25 DIAGNOSIS — K9171 Accidental puncture and laceration of a digestive system organ or structure during a digestive system procedure: Secondary | ICD-10-CM | POA: Diagnosis present

## 2015-09-25 DIAGNOSIS — R1310 Dysphagia, unspecified: Secondary | ICD-10-CM | POA: Diagnosis present

## 2015-09-25 DIAGNOSIS — Z882 Allergy status to sulfonamides status: Secondary | ICD-10-CM | POA: Diagnosis not present

## 2015-09-25 DIAGNOSIS — B3789 Other sites of candidiasis: Secondary | ICD-10-CM | POA: Diagnosis not present

## 2015-09-25 DIAGNOSIS — K219 Gastro-esophageal reflux disease without esophagitis: Secondary | ICD-10-CM | POA: Diagnosis present

## 2015-09-25 DIAGNOSIS — D696 Thrombocytopenia, unspecified: Secondary | ICD-10-CM | POA: Diagnosis not present

## 2015-09-25 DIAGNOSIS — I10 Essential (primary) hypertension: Secondary | ICD-10-CM | POA: Diagnosis not present

## 2015-09-25 DIAGNOSIS — M1990 Unspecified osteoarthritis, unspecified site: Secondary | ICD-10-CM | POA: Diagnosis present

## 2015-09-25 DIAGNOSIS — I119 Hypertensive heart disease without heart failure: Secondary | ICD-10-CM | POA: Diagnosis present

## 2015-09-25 DIAGNOSIS — Z87891 Personal history of nicotine dependence: Secondary | ICD-10-CM | POA: Diagnosis not present

## 2015-09-25 DIAGNOSIS — Z66 Do not resuscitate: Secondary | ICD-10-CM | POA: Diagnosis not present

## 2015-10-12 DIAGNOSIS — K222 Esophageal obstruction: Secondary | ICD-10-CM | POA: Diagnosis not present

## 2015-10-12 DIAGNOSIS — K223 Perforation of esophagus: Secondary | ICD-10-CM | POA: Diagnosis not present

## 2015-10-12 DIAGNOSIS — J301 Allergic rhinitis due to pollen: Secondary | ICD-10-CM | POA: Diagnosis not present

## 2015-10-12 DIAGNOSIS — I4891 Unspecified atrial fibrillation: Secondary | ICD-10-CM | POA: Diagnosis not present

## 2015-10-12 DIAGNOSIS — G20 Parkinson's disease: Secondary | ICD-10-CM | POA: Diagnosis not present

## 2015-10-12 DIAGNOSIS — H1859 Other hereditary corneal dystrophies: Secondary | ICD-10-CM | POA: Diagnosis not present

## 2015-10-13 DIAGNOSIS — G20 Parkinson's disease: Secondary | ICD-10-CM | POA: Diagnosis not present

## 2015-10-13 DIAGNOSIS — I4891 Unspecified atrial fibrillation: Secondary | ICD-10-CM | POA: Diagnosis not present

## 2015-10-13 DIAGNOSIS — J301 Allergic rhinitis due to pollen: Secondary | ICD-10-CM | POA: Diagnosis not present

## 2015-10-13 DIAGNOSIS — H1859 Other hereditary corneal dystrophies: Secondary | ICD-10-CM | POA: Diagnosis not present

## 2015-10-13 DIAGNOSIS — K223 Perforation of esophagus: Secondary | ICD-10-CM | POA: Diagnosis not present

## 2015-10-13 DIAGNOSIS — K222 Esophageal obstruction: Secondary | ICD-10-CM | POA: Diagnosis not present

## 2015-10-17 DIAGNOSIS — K222 Esophageal obstruction: Secondary | ICD-10-CM | POA: Diagnosis not present

## 2015-10-17 DIAGNOSIS — K223 Perforation of esophagus: Secondary | ICD-10-CM | POA: Diagnosis not present

## 2015-10-17 DIAGNOSIS — H1859 Other hereditary corneal dystrophies: Secondary | ICD-10-CM | POA: Diagnosis not present

## 2015-10-17 DIAGNOSIS — G20 Parkinson's disease: Secondary | ICD-10-CM | POA: Diagnosis not present

## 2015-10-17 DIAGNOSIS — J301 Allergic rhinitis due to pollen: Secondary | ICD-10-CM | POA: Diagnosis not present

## 2015-10-17 DIAGNOSIS — I4891 Unspecified atrial fibrillation: Secondary | ICD-10-CM | POA: Diagnosis not present

## 2015-10-24 DIAGNOSIS — G20 Parkinson's disease: Secondary | ICD-10-CM | POA: Diagnosis not present

## 2015-10-24 DIAGNOSIS — K223 Perforation of esophagus: Secondary | ICD-10-CM | POA: Diagnosis not present

## 2015-10-24 DIAGNOSIS — H1859 Other hereditary corneal dystrophies: Secondary | ICD-10-CM | POA: Diagnosis not present

## 2015-10-24 DIAGNOSIS — I4891 Unspecified atrial fibrillation: Secondary | ICD-10-CM | POA: Diagnosis not present

## 2015-10-24 DIAGNOSIS — K222 Esophageal obstruction: Secondary | ICD-10-CM | POA: Diagnosis not present

## 2015-10-24 DIAGNOSIS — J301 Allergic rhinitis due to pollen: Secondary | ICD-10-CM | POA: Diagnosis not present

## 2015-10-29 DIAGNOSIS — H1859 Other hereditary corneal dystrophies: Secondary | ICD-10-CM | POA: Diagnosis not present

## 2015-10-29 DIAGNOSIS — I4891 Unspecified atrial fibrillation: Secondary | ICD-10-CM | POA: Diagnosis not present

## 2015-10-29 DIAGNOSIS — G20 Parkinson's disease: Secondary | ICD-10-CM | POA: Diagnosis not present

## 2015-10-29 DIAGNOSIS — K223 Perforation of esophagus: Secondary | ICD-10-CM | POA: Diagnosis not present

## 2015-10-29 DIAGNOSIS — K222 Esophageal obstruction: Secondary | ICD-10-CM | POA: Diagnosis not present

## 2015-10-29 DIAGNOSIS — J301 Allergic rhinitis due to pollen: Secondary | ICD-10-CM | POA: Diagnosis not present

## 2015-10-31 ENCOUNTER — Ambulatory Visit: Payer: Federal, State, Local not specified - PPO | Admitting: Neurology

## 2015-11-01 DIAGNOSIS — H1859 Other hereditary corneal dystrophies: Secondary | ICD-10-CM | POA: Diagnosis not present

## 2015-11-01 DIAGNOSIS — I4891 Unspecified atrial fibrillation: Secondary | ICD-10-CM | POA: Diagnosis not present

## 2015-11-01 DIAGNOSIS — J301 Allergic rhinitis due to pollen: Secondary | ICD-10-CM | POA: Diagnosis not present

## 2015-11-01 DIAGNOSIS — G20 Parkinson's disease: Secondary | ICD-10-CM | POA: Diagnosis not present

## 2015-11-01 DIAGNOSIS — K223 Perforation of esophagus: Secondary | ICD-10-CM | POA: Diagnosis not present

## 2015-11-01 DIAGNOSIS — K222 Esophageal obstruction: Secondary | ICD-10-CM | POA: Diagnosis not present

## 2015-11-03 ENCOUNTER — Ambulatory Visit: Payer: Federal, State, Local not specified - PPO | Admitting: Neurology

## 2015-11-07 DIAGNOSIS — G20 Parkinson's disease: Secondary | ICD-10-CM | POA: Diagnosis not present

## 2015-11-07 DIAGNOSIS — J301 Allergic rhinitis due to pollen: Secondary | ICD-10-CM | POA: Diagnosis not present

## 2015-11-07 DIAGNOSIS — K222 Esophageal obstruction: Secondary | ICD-10-CM | POA: Diagnosis not present

## 2015-11-07 DIAGNOSIS — K223 Perforation of esophagus: Secondary | ICD-10-CM | POA: Diagnosis not present

## 2015-11-07 DIAGNOSIS — I4891 Unspecified atrial fibrillation: Secondary | ICD-10-CM | POA: Diagnosis not present

## 2015-11-07 DIAGNOSIS — H1859 Other hereditary corneal dystrophies: Secondary | ICD-10-CM | POA: Diagnosis not present

## 2015-11-14 DIAGNOSIS — G20 Parkinson's disease: Secondary | ICD-10-CM | POA: Diagnosis not present

## 2015-11-14 DIAGNOSIS — J301 Allergic rhinitis due to pollen: Secondary | ICD-10-CM | POA: Diagnosis not present

## 2015-11-14 DIAGNOSIS — H1859 Other hereditary corneal dystrophies: Secondary | ICD-10-CM | POA: Diagnosis not present

## 2015-11-14 DIAGNOSIS — I4891 Unspecified atrial fibrillation: Secondary | ICD-10-CM | POA: Diagnosis not present

## 2015-11-14 DIAGNOSIS — K222 Esophageal obstruction: Secondary | ICD-10-CM | POA: Diagnosis not present

## 2015-11-14 DIAGNOSIS — K223 Perforation of esophagus: Secondary | ICD-10-CM | POA: Diagnosis not present

## 2015-11-21 DIAGNOSIS — J301 Allergic rhinitis due to pollen: Secondary | ICD-10-CM | POA: Diagnosis not present

## 2015-11-21 DIAGNOSIS — G20 Parkinson's disease: Secondary | ICD-10-CM | POA: Diagnosis not present

## 2015-11-21 DIAGNOSIS — H1859 Other hereditary corneal dystrophies: Secondary | ICD-10-CM | POA: Diagnosis not present

## 2015-11-21 DIAGNOSIS — K222 Esophageal obstruction: Secondary | ICD-10-CM | POA: Diagnosis not present

## 2015-11-21 DIAGNOSIS — K223 Perforation of esophagus: Secondary | ICD-10-CM | POA: Diagnosis not present

## 2015-11-21 DIAGNOSIS — I4891 Unspecified atrial fibrillation: Secondary | ICD-10-CM | POA: Diagnosis not present

## 2015-11-28 DIAGNOSIS — J301 Allergic rhinitis due to pollen: Secondary | ICD-10-CM | POA: Diagnosis not present

## 2015-11-28 DIAGNOSIS — K222 Esophageal obstruction: Secondary | ICD-10-CM | POA: Diagnosis not present

## 2015-11-28 DIAGNOSIS — K223 Perforation of esophagus: Secondary | ICD-10-CM | POA: Diagnosis not present

## 2015-11-28 DIAGNOSIS — G20 Parkinson's disease: Secondary | ICD-10-CM | POA: Diagnosis not present

## 2015-11-28 DIAGNOSIS — I4891 Unspecified atrial fibrillation: Secondary | ICD-10-CM | POA: Diagnosis not present

## 2015-11-28 DIAGNOSIS — H1859 Other hereditary corneal dystrophies: Secondary | ICD-10-CM | POA: Diagnosis not present

## 2015-11-29 DIAGNOSIS — K223 Perforation of esophagus: Secondary | ICD-10-CM | POA: Diagnosis not present

## 2015-11-29 DIAGNOSIS — J301 Allergic rhinitis due to pollen: Secondary | ICD-10-CM | POA: Diagnosis not present

## 2015-11-29 DIAGNOSIS — H1859 Other hereditary corneal dystrophies: Secondary | ICD-10-CM | POA: Diagnosis not present

## 2015-11-29 DIAGNOSIS — K222 Esophageal obstruction: Secondary | ICD-10-CM | POA: Diagnosis not present

## 2015-11-29 DIAGNOSIS — I4891 Unspecified atrial fibrillation: Secondary | ICD-10-CM | POA: Diagnosis not present

## 2015-11-29 DIAGNOSIS — G20 Parkinson's disease: Secondary | ICD-10-CM | POA: Diagnosis not present

## 2015-12-05 DIAGNOSIS — H1859 Other hereditary corneal dystrophies: Secondary | ICD-10-CM | POA: Diagnosis not present

## 2015-12-05 DIAGNOSIS — I10 Essential (primary) hypertension: Secondary | ICD-10-CM | POA: Diagnosis not present

## 2015-12-05 DIAGNOSIS — G20 Parkinson's disease: Secondary | ICD-10-CM | POA: Diagnosis not present

## 2015-12-05 DIAGNOSIS — K222 Esophageal obstruction: Secondary | ICD-10-CM | POA: Diagnosis not present

## 2015-12-05 DIAGNOSIS — I4891 Unspecified atrial fibrillation: Secondary | ICD-10-CM | POA: Diagnosis not present

## 2015-12-05 DIAGNOSIS — J301 Allergic rhinitis due to pollen: Secondary | ICD-10-CM | POA: Diagnosis not present

## 2015-12-05 DIAGNOSIS — K223 Perforation of esophagus: Secondary | ICD-10-CM | POA: Diagnosis not present

## 2015-12-05 DIAGNOSIS — F322 Major depressive disorder, single episode, severe without psychotic features: Secondary | ICD-10-CM | POA: Diagnosis not present

## 2015-12-05 DIAGNOSIS — G301 Alzheimer's disease with late onset: Secondary | ICD-10-CM | POA: Diagnosis not present

## 2015-12-05 DIAGNOSIS — I480 Paroxysmal atrial fibrillation: Secondary | ICD-10-CM | POA: Diagnosis not present

## 2015-12-15 DIAGNOSIS — K222 Esophageal obstruction: Secondary | ICD-10-CM | POA: Diagnosis not present

## 2015-12-15 DIAGNOSIS — I4891 Unspecified atrial fibrillation: Secondary | ICD-10-CM | POA: Diagnosis not present

## 2015-12-15 DIAGNOSIS — G20 Parkinson's disease: Secondary | ICD-10-CM | POA: Diagnosis not present

## 2015-12-15 DIAGNOSIS — H1859 Other hereditary corneal dystrophies: Secondary | ICD-10-CM | POA: Diagnosis not present

## 2015-12-15 DIAGNOSIS — J301 Allergic rhinitis due to pollen: Secondary | ICD-10-CM | POA: Diagnosis not present

## 2015-12-15 DIAGNOSIS — K223 Perforation of esophagus: Secondary | ICD-10-CM | POA: Diagnosis not present

## 2016-01-29 DIAGNOSIS — G629 Polyneuropathy, unspecified: Secondary | ICD-10-CM | POA: Diagnosis not present

## 2016-01-29 DIAGNOSIS — F322 Major depressive disorder, single episode, severe without psychotic features: Secondary | ICD-10-CM | POA: Diagnosis not present

## 2016-01-29 DIAGNOSIS — I10 Essential (primary) hypertension: Secondary | ICD-10-CM | POA: Diagnosis not present

## 2016-01-29 DIAGNOSIS — I480 Paroxysmal atrial fibrillation: Secondary | ICD-10-CM | POA: Diagnosis not present

## 2016-01-29 DIAGNOSIS — Z79899 Other long term (current) drug therapy: Secondary | ICD-10-CM | POA: Diagnosis not present

## 2016-01-29 DIAGNOSIS — G301 Alzheimer's disease with late onset: Secondary | ICD-10-CM | POA: Diagnosis not present

## 2016-02-06 DIAGNOSIS — M25512 Pain in left shoulder: Secondary | ICD-10-CM | POA: Diagnosis not present

## 2016-02-20 DIAGNOSIS — M1712 Unilateral primary osteoarthritis, left knee: Secondary | ICD-10-CM | POA: Diagnosis not present

## 2016-03-04 DIAGNOSIS — M1712 Unilateral primary osteoarthritis, left knee: Secondary | ICD-10-CM | POA: Diagnosis not present

## 2016-03-04 DIAGNOSIS — M25562 Pain in left knee: Secondary | ICD-10-CM | POA: Diagnosis not present

## 2016-03-05 DIAGNOSIS — H01029 Squamous blepharitis unspecified eye, unspecified eyelid: Secondary | ICD-10-CM | POA: Diagnosis not present

## 2016-03-05 DIAGNOSIS — Z961 Presence of intraocular lens: Secondary | ICD-10-CM | POA: Diagnosis not present

## 2016-03-05 DIAGNOSIS — H1851 Endothelial corneal dystrophy: Secondary | ICD-10-CM | POA: Diagnosis not present

## 2016-03-12 DIAGNOSIS — F5101 Primary insomnia: Secondary | ICD-10-CM | POA: Diagnosis not present

## 2016-03-12 DIAGNOSIS — F322 Major depressive disorder, single episode, severe without psychotic features: Secondary | ICD-10-CM | POA: Diagnosis not present

## 2016-03-12 DIAGNOSIS — Z0000 Encounter for general adult medical examination without abnormal findings: Secondary | ICD-10-CM | POA: Diagnosis not present

## 2016-03-12 DIAGNOSIS — G629 Polyneuropathy, unspecified: Secondary | ICD-10-CM | POA: Diagnosis not present

## 2016-03-12 DIAGNOSIS — I10 Essential (primary) hypertension: Secondary | ICD-10-CM | POA: Diagnosis not present

## 2016-03-12 DIAGNOSIS — G301 Alzheimer's disease with late onset: Secondary | ICD-10-CM | POA: Diagnosis not present

## 2016-03-12 DIAGNOSIS — M1990 Unspecified osteoarthritis, unspecified site: Secondary | ICD-10-CM | POA: Diagnosis not present

## 2016-03-12 DIAGNOSIS — I480 Paroxysmal atrial fibrillation: Secondary | ICD-10-CM | POA: Diagnosis not present

## 2016-04-02 ENCOUNTER — Encounter (HOSPITAL_COMMUNITY)
Admission: RE | Admit: 2016-04-02 | Discharge: 2016-04-02 | Disposition: A | Discharge status: Home / Self Care | Payer: Medicare Other | Source: Ambulatory Visit | Attending: Orthopedic Surgery | Admitting: Orthopedic Surgery

## 2016-04-02 ENCOUNTER — Encounter (HOSPITAL_COMMUNITY): Payer: Self-pay

## 2016-04-02 DIAGNOSIS — M25562 Pain in left knee: Secondary | ICD-10-CM | POA: Insufficient documentation

## 2016-04-02 DIAGNOSIS — Z01812 Encounter for preprocedural laboratory examination: Secondary | ICD-10-CM | POA: Diagnosis not present

## 2016-04-02 DIAGNOSIS — M1712 Unilateral primary osteoarthritis, left knee: Secondary | ICD-10-CM | POA: Insufficient documentation

## 2016-04-02 HISTORY — DX: Unspecified disorder of eyelid: H02.9

## 2016-04-02 HISTORY — DX: Unspecified hearing loss, unspecified ear: H91.90

## 2016-04-02 HISTORY — DX: Allergy status to unspecified drugs, medicaments and biological substances: Z88.9

## 2016-04-02 LAB — SURGICAL PCR SCREEN
MRSA, PCR: NEGATIVE
Staphylococcus aureus: NEGATIVE

## 2016-04-02 LAB — BASIC METABOLIC PANEL
ANION GAP: 6 (ref 5–15)
BUN: 16 mg/dL (ref 6–20)
CO2: 28 mmol/L (ref 22–32)
Calcium: 9.1 mg/dL (ref 8.9–10.3)
Chloride: 106 mmol/L (ref 101–111)
Creatinine, Ser: 0.84 mg/dL (ref 0.44–1.00)
Glucose, Bld: 91 mg/dL (ref 65–99)
POTASSIUM: 4.5 mmol/L (ref 3.5–5.1)
SODIUM: 140 mmol/L (ref 135–145)

## 2016-04-02 LAB — CBC
HEMATOCRIT: 36.8 % (ref 36.0–46.0)
Hemoglobin: 12.3 g/dL (ref 12.0–15.0)
MCH: 31.7 pg (ref 26.0–34.0)
MCHC: 33.4 g/dL (ref 30.0–36.0)
MCV: 94.8 fL (ref 78.0–100.0)
Platelets: 162 10*3/uL (ref 150–400)
RBC: 3.88 MIL/uL (ref 3.87–5.11)
RDW: 13.3 % (ref 11.5–15.5)
WBC: 5.7 10*3/uL (ref 4.0–10.5)

## 2016-04-02 NOTE — Patient Instructions (Addendum)
Stacey Jefferson  04/02/2016   Your procedure is scheduled on: 04-09-16   Report to [facility] Main  Entrance take [facility]  elevators to 3rd floor to  [place] at 1000 AM.  Call this number if you have problems the morning of surgery [phone]   Remember: ONLY 1 PERSON MAY GO WITH YOU TO SHORT STAY TO GET  READY MORNING OF [place].  Do not eat food or drink liquids :After Midnight.     Take these medicines the morning of surgery with A SIP OF WATER: Loratadine(Claritin). Omeprazole.Tylenol. DO NOT TAKE ANY DIABETIC MEDICATIONS DAY OF YOUR SURGERY                               You may not have any metal on your body including hair pins and              piercings  Do not wear jewelry, make-up, lotions, powders or perfumes, deodorant             Do not wear nail polish.  Do not shave  48 hours prior to surgery.              Men may shave face and neck.   Do not bring valuables to the hospital. [place].  Contacts, dentures or bridgework may not be worn into surgery.  Leave suitcase in the car. After surgery it may be brought to your room.     Patients discharged the day of surgery will not be allowed to drive home.  Name and phone number of your driver:Nancy -daughter [phone] cell  Special Instructions: N/A              Please read over the following fact sheets you were given: _____________________________________________________________________             [facility] - Preparing for Surgery Before surgery, you can play an important role.  Because skin is not sterile, your skin needs to be as free of germs as possible.  You can reduce the number of germs on your skin by washing with CHG (chlorahexidine gluconate) soap before surgery.  CHG is an antiseptic cleaner which kills germs and bonds with the skin to continue killing germs even after washing. Please DO NOT use if you have an allergy to  CHG or antibacterial soaps.  If your skin becomes reddened/irritated stop using the CHG and inform your nurse when you arrive at Short Stay. Do not shave (including legs and underarms) for at least 48 hours prior to the first CHG shower.  You may shave your face/neck. Please follow these instructions carefully:  1.  Shower with CHG Soap the night before surgery and the  morning of Surgery.  2.  If you choose to wash your hair, wash your hair first as usual with your  normal  shampoo.  3.  After you shampoo, rinse your hair and body thoroughly to remove the  shampoo.                           4.  Use CHG as you would any other liquid soap.  You can apply chg directly  to the  skin and wash                       Gently with a scrungie or clean washcloth.  5.  Apply the CHG Soap to your body ONLY FROM THE NECK DOWN.   Do not use on face/ open                           Wound or open sores. Avoid contact with eyes, ears mouth and genitals (private parts).                       Wash face,  Genitals (private parts) with your normal soap.             6.  Wash thoroughly, paying special attention to the area where your surgery  will be performed.  7.  Thoroughly rinse your body with warm water from the neck down.  8.  DO NOT shower/wash with your normal soap after using and rinsing off  the CHG Soap.                9.  Pat yourself dry with a clean towel.            10.  Wear clean pajamas.            11.  Place clean sheets on your bed the night of your first shower and do not  sleep with pets. Day of Surgery : Do not apply any lotions/deodorants the morning of surgery.  Please wear clean clothes to the hospital/surgery center.  FAILURE TO FOLLOW THESE INSTRUCTIONS MAY RESULT IN THE CANCELLATION OF YOUR SURGERY PATIENT SIGNATURE_________________________________  NURSE SIGNATURE__________________________________  ________________________________________________________________________   Adam Phenix  An incentive spirometer is a tool that can help keep your lungs clear and active. This tool measures how well you are filling your lungs with each breath. Taking long deep breaths may help reverse or decrease the chance of developing breathing (pulmonary) problems (especially infection) following:  A long period of time when you are unable to move or be active. BEFORE THE PROCEDURE   If the spirometer includes an indicator to show your best effort, your nurse or respiratory therapist will set it to a desired goal.  If possible, sit up straight or lean slightly forward. Try not to slouch.  Hold the incentive spirometer in an upright position. INSTRUCTIONS FOR USE   Sit on the edge of your bed if possible, or sit up as far as you can in bed or on a chair.  Hold the incentive spirometer in an upright position.  Breathe out normally.  Place the mouthpiece in your mouth and seal your lips tightly around it.  Breathe in slowly and as deeply as possible, raising the piston or the ball toward the top of the column.  Hold your breath for 3-5 seconds or for as long as possible. Allow the piston or ball to fall to the bottom of the column.  Remove the mouthpiece from your mouth and breathe out normally.  Rest for a few seconds and repeat Steps 1 through 7 at least 10 times every 1-2 hours when you are awake. Take your time and take a few normal breaths between deep breaths.  The spirometer may include an indicator to show your best effort. Use the indicator as a goal to work toward during each repetition.  After each set of  10 deep breaths, practice coughing to be sure your lungs are clear. If you have an incision (the cut made at the time of surgery), support your incision when coughing by placing a pillow or rolled up towels firmly against it. Once you are able to get out of bed, walk around indoors and cough well. You may stop using the incentive spirometer when instructed by  your caregiver.  RISKS AND COMPLICATIONS  Take your time so you do not get dizzy or light-headed.  If you are in pain, you may need to take or ask for pain medication before doing incentive spirometry. It is harder to take a deep breath if you are having pain. AFTER USE  Rest and breathe slowly and easily.  It can be helpful to keep track of a log of your progress. Your caregiver can provide you with a simple table to help with this. If you are using the spirometer at home, follow these instructions: [place] IF:   You are having difficultly using the spirometer.  You have trouble using the spirometer as often as instructed.  Your pain medication is not giving enough relief while using the spirometer.  You develop fever of 100.5 F (38.1 C) or higher. SEEK IMMEDIATE MEDICAL CARE IF:   You cough up bloody sputum that had not been present before.  You develop fever of 102 F (38.9 C) or greater.  You develop worsening pain at or near the incision site. MAKE SURE YOU:   Understand these instructions.  Will watch your condition.  Will get help right away if you are not doing well or get worse. Document Released: 02/24/2007 Document Revised: 01/06/2012 Document Reviewed: 04/27/2007 ExitCare Patient Information 2014 Exit[employer], [place].   ________________________________________________________________________  WHAT IS A BLOOD TRANSFUSION? Blood Transfusion Information  A transfusion is the replacement of blood or some of its parts. Blood is made up of multiple cells which provide different functions.  Red blood cells carry oxygen and are used for blood loss replacement.  White blood cells fight against infection.  Platelets control bleeding.  Plasma helps clot blood.  Other blood products are available for specialized needs, such as hemophilia or other clotting disorders. BEFORE THE TRANSFUSION  Who gives blood for transfusions?   Healthy volunteers who are  fully evaluated to make sure their blood is safe. This is blood bank blood. Transfusion therapy is the safest it has ever been in the practice of medicine. Before blood is taken from a donor, a complete history is taken to make sure that person has no history of diseases nor engages in risky social behavior (examples are intravenous drug use or sexual activity with multiple partners). The donor's travel history is screened to minimize risk of transmitting infections, such as malaria. The donated blood is tested for signs of infectious diseases, such as HIV and hepatitis. The blood is then tested to be sure it is compatible with you in order to minimize the chance of a transfusion reaction. If you or a relative donates blood, this is often done in anticipation of surgery and is not appropriate for emergency situations. It takes many days to process the donated blood. RISKS AND COMPLICATIONS Although transfusion therapy is very safe and saves many lives, the main dangers of transfusion include:   Getting an infectious disease.  Developing a transfusion reaction. This is an allergic reaction to something in the blood you were given. Every precaution is taken to prevent this. The decision to have a blood  transfusion has been considered carefully by your caregiver before blood is given. Blood is not given unless the benefits outweigh the risks. AFTER THE TRANSFUSION  Right after receiving a blood transfusion, you will usually feel much better and more energetic. This is especially true if your red blood cells have gotten low (anemic). The transfusion raises the level of the red blood cells which carry oxygen, and this usually causes an energy increase.  The nurse administering the transfusion will monitor you carefully for complications. HOME CARE INSTRUCTIONS  No special instructions are needed after a transfusion. You may find your energy is better. Speak with your caregiver about any limitations on  activity for underlying diseases you may have. SEEK MEDICAL CARE IF:   Your condition is not improving after your transfusion.  You develop redness or irritation at the intravenous (IV) site. SEEK IMMEDIATE MEDICAL CARE IF:  Any of the following symptoms occur over the next 12 hours:  Shaking chills.  You have a temperature by mouth above 102 F (38.9 C), not controlled by medicine.  Chest, back, or muscle pain.  People around you feel you are not acting correctly or are confused.  Shortness of breath or difficulty breathing.  Dizziness and fainting.  You get a rash or develop hives.  You have a decrease in urine output.  Your urine turns a dark color or changes to pink, red, or brown. Any of the following symptoms occur over the next 10 days:  You have a temperature by mouth above 102 F (38.9 C), not controlled by medicine.  Shortness of breath.  Weakness after normal activity.  The white part of the eye turns yellow (jaundice).  You have a decrease in the amount of urine or are urinating less often.  Your urine turns a dark color or changes to pink, red, or brown. Document Released: 10/11/2000 Document Revised: 01/06/2012 Document Reviewed: 05/30/2008 [facility] Patient Information 2014 [place], [place].  _______________________________________________________________________

## 2016-04-02 NOTE — Pre-Procedure Instructions (Signed)
EKG ,CXR 05-02-15, Echo 12'16, with chart. Clearance -(Dr. Felipa Eth 5'16)  With chart.

## 2016-04-03 NOTE — H&P (Signed)
TOTAL KNEE ADMISSION H&P  Patient is being admitted for left total knee arthroplasty.  Subjective:  Chief Complaint:left knee pain.  HPI: Stacey Jefferson, 82 y.o. female, has a history of pain and functional disability in the left knee due to arthritis and has failed non-surgical conservative treatments for greater than 12 weeks to includeNSAID's and/or analgesics, corticosteriod injections, viscosupplementation injections, use of assistive devices and activity modification.  Onset of symptoms was gradual, starting >10 years ago with gradually worsening course since that time. The patient noted no past surgery on the left knee(s).  Patient currently rates pain in the left knee(s) at 8 out of 10 with activity. Patient has night pain, worsening of pain with activity and weight bearing, pain that interferes with activities of daily living, pain with passive range of motion, crepitus and joint swelling.  Patient has evidence of periarticular osteophytes and joint space narrowing by imaging studies.  There is no active infection.  Patient Active Problem List   Diagnosis Date Noted  . History of total knee arthroplasty 05/10/2015  . Ambulatory dysfunction 09/16/2013  . Diarrhea 09/16/2013  . Hypokalemia 09/16/2013  . AKI (acute kidney injury) ([place]) 09/16/2013  . Mild cognitive impairment 04/23/2013  . Weight loss   . Vomiting   . Dysphagia, unspecified(787.20)   . Irritable bowel syndrome   . Carpal tunnel syndrome   . Abnormality of gait   . Headache   . Pain in limb    Past Medical History  Diagnosis Date  . Weight loss   . Vomiting   . Dysphagia, unspecified(787.20)   . Memory loss   . Hypotension, unspecified   . Irritable bowel syndrome   . Restless legs syndrome (RLS)   . Carpal tunnel syndrome   . Abnormality of gait     uses rollator and walker at home for ambulation  . Headache   . Pain in limb   . Polyneuropathy in other diseases classified elsewhere ([place])   . Hypertension    . GERD (gastroesophageal reflux disease)   . Arthritis   . Cancer ([place])     skin cancer left cheek with Mohrs  . HOH (hard of hearing)     bilateral hearing aids  . Eyelid abnormality     left eyelid droops(common)  . Hx of seasonal allergies   . Dysrhythmia     Episode of atrial fibrillation ([place])after esophageal tear s/p Endoscopy at [facility]-records with cahrt.    Past Surgical History  Procedure Laterality Date  . Bladder aspiration    . Carpal tunnel release Right   . Squamous cell carcinoma excision    . Abdominal hysterectomy    . Eye surgery      bilateral cataract surgery with lens implants  . Mouth implants  1991  . Total knee arthroplasty Bilateral 05/10/2015    Procedure: TOTAL RIGHT  KNEE ARTHROPLASTY ;   LEFT KNEE STEROID INJECTION ;  Surgeon: Latanya Maudlin, MD;  Location: [facility] ORS;  Service: Orthopedics;  Laterality: Bilateral;      Current outpatient prescriptions:  .  acetaminophen (TYLENOL) 160 MG/5ML solution, Take 20.3 mLs (650 mg total) by mouth every 6 (six) hours as needed for mild pain, headache or fever., Disp: 120 mL, Rfl: 0 .  aspirin 81 MG chewable tablet, Chew 81 mg by mouth daily., Disp: , Rfl:  .  bisoprolol (ZEBETA) 5 MG tablet, Take 5 mg by mouth at bedtime. , Disp: , Rfl:  .  carbidopa-levodopa (SINEMET) 25-100 MG per  tablet, Take 1 tablet by mouth 3 (three) times daily. (Patient taking differently: Take 1 tablet by mouth at bedtime. ), Disp: 90 tablet, Rfl: 3 .  chlorhexidine (PERIDEX) 0.12 % solution, Use as directed 15 mLs in the mouth or throat daily. , Disp: , Rfl: 0 .  clonazePAM (KLONOPIN) 0.5 MG tablet, Take 1 tablet (0.5 mg total) by mouth at bedtime as needed. (Patient taking differently: Take 0.5 mg by mouth at bedtime. ), Disp: 90 tablet, Rfl: 3 .  Cyanocobalamin (VITAMIN B 12 PO), Take 1 tablet by mouth daily. , Disp: , Rfl:  .  donepezil (ARICEPT) 10 MG tablet, Take 1 tablet (10 mg total) by mouth at bedtime., Disp: 90 tablet, Rfl:  3 .  liver oil-zinc oxide (DESITIN) 40 % ointment, Apply 1 application topically as needed for irritation (to bottom after each BM-using a generic ' Butt paste w zinc")., Disp: , Rfl:  .  loperamide (IMODIUM) 1 MG/5ML solution, Take 2 mg by mouth as needed for diarrhea or loose stools., Disp: , Rfl:  .  loratadine (CLARITIN) 10 MG tablet, Take 10 mg by mouth daily., Disp: , Rfl:  .  methocarbamol (ROBAXIN) 500 MG tablet, Take 1 tablet (500 mg total) by mouth every 6 (six) hours as needed for muscle spasms. (Patient taking differently: Take 250-500 mg by mouth 2 (two) times daily. Take half a tablet in the morning and one tablet in the afternoon.), Disp: 40 tablet, Rfl: 1 .  omeprazole (PRILOSEC) 20 MG capsule, Take 20 mg by mouth 2 (two) times daily. Opens capsule and mixes in with food or water, Disp: , Rfl:  .  Polyethyl Glycol-Propyl Glycol (SYSTANE OP), Place 1 drop into both eyes 3 (three) times daily. , Disp: , Rfl:  .  Salicylic Acid (SALACYN) 6 % LOTN, Apply 1 application topically as needed (for legs). , Disp: , Rfl:  .  Wheat Dextrin (BENEFIBER) POWD, Take 10 mLs by mouth 2 (two) times daily., Disp: , Rfl:   Allergies  Allergen Reactions  . Clarithromycin Diarrhea  . Diltiazem Itching    "itching, rash"  . Sulfa Antibiotics Other (See Comments)    Reaction unknown    Social History  Substance Use Topics  . Smoking status: Former Smoker    Types: Cigarettes  . Smokeless tobacco: Never Used     Comment: Quit 60 years ago.  . Alcohol Use: Yes     Comment: Rare glass of wine    Family History  Problem Relation Age of Onset  . Dementia Mother   . Stroke Father      Review of Systems  Constitutional: Positive for malaise/fatigue. Negative for fever, chills, weight loss and diaphoresis.  HENT: Positive for hearing loss. Negative for congestion, ear discharge, ear pain, nosebleeds, sore throat and tinnitus.   Eyes: Negative.   Respiratory: Positive for shortness of breath.  Negative for cough, hemoptysis, sputum production, wheezing and stridor.        SOB with exertion  Cardiovascular: Positive for orthopnea. Negative for chest pain, palpitations, claudication, leg swelling and PND.  Gastrointestinal: Negative for heartburn, nausea, vomiting, abdominal pain, diarrhea, constipation, blood in stool and melena.       Positive for dysphagia   Genitourinary: Positive for frequency. Negative for dysuria, urgency, hematuria and flank pain.       Positive for bowel and bladder incontinence  Musculoskeletal: Positive for myalgias, back pain and joint pain. Negative for falls and neck pain.  Skin: Negative.  Neurological: Positive for tremors and weakness. Negative for dizziness, tingling, sensory change, speech change, focal weakness, seizures, loss of consciousness and headaches.  Endo/Heme/Allergies: Negative.   Psychiatric/Behavioral: Positive for memory loss. Negative for depression, suicidal ideas, hallucinations and substance abuse. The patient is nervous/anxious and has insomnia.     Objective:  Physical Exam  Constitutional: She is oriented to person, place, and time. She appears well-developed and well-nourished. No distress.  HENT:  Head: Normocephalic and atraumatic.  Right Ear: External ear normal.  Left Ear: External ear normal.  Nose: Nose normal.  Mouth/Throat: Oropharynx is clear and moist.  Eyes: Conjunctivae and EOM are normal.  Neck: Normal range of motion. Neck supple.  Cardiovascular: Normal rate, regular rhythm, normal heart sounds and intact distal pulses.   No murmur heard. Respiratory: Effort normal and breath sounds normal. No respiratory distress. She has no wheezes.  GI: Soft. Bowel sounds are normal. She exhibits no distension. There is no tenderness.  Musculoskeletal:       Right hip: Normal.       Left hip: Normal.       Right knee: She exhibits decreased range of motion. She exhibits no swelling, no effusion and no erythema. No  tenderness found.       Left knee: She exhibits decreased range of motion and swelling. She exhibits no effusion and no erythema. Tenderness found. Medial joint line and lateral joint line tenderness noted.  Flexion contracture 15-120 right TKA. Left knee 10-115  Neurological: She is alert and oriented to person, place, and time. She has normal strength and normal reflexes. No sensory deficit.  Skin: No rash noted. She is not diaphoretic. No erythema.  Psychiatric: She has a normal mood and affect. Her behavior is normal.    Vital signs in last 24 hours: Temp:  [98 F (36.7 C)] 98 F (36.7 C) (06/06 1343) Pulse Rate:  [74] 74 (06/06 1343) Resp:  [16] 16 (06/06 1343) BP: (132)/(99) 132/99 mmHg (06/06 1343) SpO2:  [94 %] 94 % (06/06 1343) Weight:  [63.504 kg (140 lb)] 63.504 kg (140 lb) (06/06 1343)  Labs:   Estimated body mass index is 27.22 kg/(m^2) as calculated from the following:   Height as of 05/10/15: 5\' 1"  (1.549 m).   Weight as of 05/10/15: 65.318 kg (144 lb).   Imaging Review Plain radiographs demonstrate severe degenerative joint disease of the left knee(s). The overall alignment ismild varus. The bone quality appears to be good for age and reported activity level.  Assessment/Plan:  End stage primary osteoarthritis, left knee   The patient history, physical examination, clinical judgment of the provider and imaging studies are consistent with end stage degenerative joint disease of the left knee(s) and total knee arthroplasty is deemed medically necessary. The treatment options including medical management, injection therapy arthroscopy and arthroplasty were discussed at length. The risks and benefits of total knee arthroplasty were presented and reviewed. The risks due to aseptic loosening, infection, stiffness, patella tracking problems, thromboembolic complications and other imponderables were discussed. The patient acknowledged the explanation, agreed to proceed with the  plan and consent was signed. Patient is being admitted for inpatient treatment for surgery, pain control, PT, OT, prophylactic antibiotics, VTE prophylaxis, progressive ambulation and ADL's and discharge planning. The patient is planning to be discharged home with home health services   Home with daughters  PCP: Dr. Bufford Spikes, PA-C

## 2016-04-08 NOTE — Anesthesia Preprocedure Evaluation (Signed)
Anesthesia Evaluation  Patient identified by MRN, date of birth, ID band Patient awake    Reviewed: Allergy & Precautions, H&P , NPO status , Patient's Chart, lab work & pertinent test results  Airway Mallampati: II  TM Distance: >3 FB Neck ROM: full    Dental  (+) Dental Advisory Given, Edentulous Upper, Edentulous Lower   Pulmonary neg pulmonary ROS, former smoker,    Pulmonary exam normal breath sounds clear to auscultation       Cardiovascular Exercise Tolerance: Good hypertension, Pt. on home beta blockers Normal cardiovascular exam Rhythm:regular Rate:Normal     Neuro/Psych Dementia - mild. Polyneuropathy.  Neuromuscular disease negative neurological ROS  negative psych ROS   GI/Hepatic negative GI ROS, Neg liver ROS, GERD  Controlled and Medicated,  Endo/Other  negative endocrine ROS  Renal/GU negative Renal ROS  negative genitourinary   Musculoskeletal   Abdominal   Peds  Hematology negative hematology ROS (+)   Anesthesia Other Findings   Reproductive/Obstetrics negative OB ROS                             Anesthesia Physical Anesthesia Plan  ASA: III  Anesthesia Plan: General   Post-op Pain Management:    Induction: Intravenous  Airway Management Planned: Oral ETT  Additional Equipment:   Intra-op Plan:   Post-operative Plan: Extubation in OR  Informed Consent:   Plan Discussed with: Surgeon  Anesthesia Plan Comments:         Anesthesia Quick Evaluation

## 2016-04-09 ENCOUNTER — Encounter (HOSPITAL_COMMUNITY): Payer: Self-pay | Admitting: *Deleted

## 2016-04-09 ENCOUNTER — Inpatient Hospital Stay (HOSPITAL_COMMUNITY)
Admission: RE | Admit: 2016-04-09 | Discharge: 2016-04-13 | DRG: 470 | Disposition: A | Discharge status: Home Health | Payer: Medicare Other | Source: Ambulatory Visit | Attending: Orthopedic Surgery | Admitting: Orthopedic Surgery

## 2016-04-09 ENCOUNTER — Inpatient Hospital Stay (HOSPITAL_COMMUNITY): Payer: Medicare Other | Admitting: Anesthesiology

## 2016-04-09 ENCOUNTER — Encounter (HOSPITAL_COMMUNITY)
Admission: RE | Disposition: A | Discharge status: Home Health | Payer: Self-pay | Source: Ambulatory Visit | Attending: Orthopedic Surgery

## 2016-04-09 DIAGNOSIS — Z85828 Personal history of other malignant neoplasm of skin: Secondary | ICD-10-CM | POA: Diagnosis not present

## 2016-04-09 DIAGNOSIS — K219 Gastro-esophageal reflux disease without esophagitis: Secondary | ICD-10-CM | POA: Diagnosis present

## 2016-04-09 DIAGNOSIS — Z9842 Cataract extraction status, left eye: Secondary | ICD-10-CM | POA: Diagnosis not present

## 2016-04-09 DIAGNOSIS — G2581 Restless legs syndrome: Secondary | ICD-10-CM | POA: Diagnosis present

## 2016-04-09 DIAGNOSIS — Z79899 Other long term (current) drug therapy: Secondary | ICD-10-CM

## 2016-04-09 DIAGNOSIS — I4891 Unspecified atrial fibrillation: Secondary | ICD-10-CM | POA: Diagnosis not present

## 2016-04-09 DIAGNOSIS — L8990 Pressure ulcer of unspecified site, unspecified stage: Secondary | ICD-10-CM | POA: Diagnosis present

## 2016-04-09 DIAGNOSIS — H9190 Unspecified hearing loss, unspecified ear: Secondary | ICD-10-CM | POA: Diagnosis present

## 2016-04-09 DIAGNOSIS — Z961 Presence of intraocular lens: Secondary | ICD-10-CM | POA: Diagnosis present

## 2016-04-09 DIAGNOSIS — I480 Paroxysmal atrial fibrillation: Secondary | ICD-10-CM | POA: Diagnosis not present

## 2016-04-09 DIAGNOSIS — M179 Osteoarthritis of knee, unspecified: Secondary | ICD-10-CM | POA: Diagnosis not present

## 2016-04-09 DIAGNOSIS — G629 Polyneuropathy, unspecified: Secondary | ICD-10-CM | POA: Diagnosis present

## 2016-04-09 DIAGNOSIS — Z87891 Personal history of nicotine dependence: Secondary | ICD-10-CM | POA: Diagnosis not present

## 2016-04-09 DIAGNOSIS — I10 Essential (primary) hypertension: Secondary | ICD-10-CM | POA: Diagnosis present

## 2016-04-09 DIAGNOSIS — Z96652 Presence of left artificial knee joint: Secondary | ICD-10-CM | POA: Diagnosis not present

## 2016-04-09 DIAGNOSIS — F0390 Unspecified dementia without behavioral disturbance: Secondary | ICD-10-CM | POA: Diagnosis present

## 2016-04-09 DIAGNOSIS — Z7982 Long term (current) use of aspirin: Secondary | ICD-10-CM

## 2016-04-09 DIAGNOSIS — M1712 Unilateral primary osteoarthritis, left knee: Secondary | ICD-10-CM | POA: Diagnosis present

## 2016-04-09 DIAGNOSIS — Z9841 Cataract extraction status, right eye: Secondary | ICD-10-CM

## 2016-04-09 DIAGNOSIS — M24562 Contracture, left knee: Secondary | ICD-10-CM | POA: Diagnosis not present

## 2016-04-09 DIAGNOSIS — I481 Persistent atrial fibrillation: Secondary | ICD-10-CM | POA: Diagnosis not present

## 2016-04-09 DIAGNOSIS — M25562 Pain in left knee: Secondary | ICD-10-CM | POA: Diagnosis not present

## 2016-04-09 HISTORY — PX: TOTAL KNEE ARTHROPLASTY: SHX125

## 2016-04-09 HISTORY — DX: Unspecified atrial fibrillation: I48.91

## 2016-04-09 LAB — TYPE AND SCREEN
ABO/RH(D): B POS
Antibody Screen: NEGATIVE

## 2016-04-09 SURGERY — ARTHROPLASTY, KNEE, TOTAL
Anesthesia: General | Site: Knee | Laterality: Left

## 2016-04-09 MED ORDER — BUPIVACAINE HCL (PF) 0.25 % IJ SOLN
INTRAMUSCULAR | Status: AC
  Filled 2016-04-09: qty 30

## 2016-04-09 MED ORDER — SUGAMMADEX SODIUM 200 MG/2ML IV SOLN
INTRAVENOUS | Status: AC
  Filled 2016-04-09: qty 2

## 2016-04-09 MED ORDER — POLYVINYL ALCOHOL 1.4 % OP SOLN
Freq: Three times a day (TID) | OPHTHALMIC | Status: DC
  Administered 2016-04-09 – 2016-04-10 (×2): via OPHTHALMIC
  Administered 2016-04-10: 1 [drp] via OPHTHALMIC
  Administered 2016-04-10: 19:00:00 via OPHTHALMIC
  Administered 2016-04-11: 1 [drp] via OPHTHALMIC
  Administered 2016-04-11 – 2016-04-12 (×5): via OPHTHALMIC
  Filled 2016-04-09: qty 15

## 2016-04-09 MED ORDER — EPHEDRINE SULFATE 50 MG/ML IJ SOLN
INTRAMUSCULAR | Status: AC
  Filled 2016-04-09: qty 1

## 2016-04-09 MED ORDER — BISOPROLOL FUMARATE 5 MG PO TABS
5.0000 mg | ORAL_TABLET | Freq: Every day | ORAL | Status: DC
  Administered 2016-04-09 – 2016-04-12 (×4): 5 mg via ORAL
  Filled 2016-04-09 (×5): qty 1

## 2016-04-09 MED ORDER — BISACODYL 5 MG PO TBEC: 5.0000 mg | DELAYED_RELEASE_TABLET | Freq: Every day | ORAL | Status: DC | PRN

## 2016-04-09 MED ORDER — ONDANSETRON HCL 4 MG/2ML IJ SOLN: 4.0000 mg | Freq: Four times a day (QID) | INTRAMUSCULAR | Status: DC | PRN

## 2016-04-09 MED ORDER — SODIUM CHLORIDE 0.9 % IJ SOLN
INTRAMUSCULAR | Status: DC | PRN
  Administered 2016-04-09: 20 mL

## 2016-04-09 MED ORDER — ONDANSETRON HCL 4 MG PO TABS: 4.0000 mg | ORAL_TABLET | Freq: Four times a day (QID) | ORAL | Status: DC | PRN

## 2016-04-09 MED ORDER — SODIUM CHLORIDE 0.9 % IR SOLN
Status: AC
  Filled 2016-04-09: qty 1

## 2016-04-09 MED ORDER — FENTANYL CITRATE (PF) 250 MCG/5ML IJ SOLN
INTRAMUSCULAR | Status: AC
  Filled 2016-04-09: qty 5

## 2016-04-09 MED ORDER — PHENOL 1.4 % MT LIQD
1.0000 | OROMUCOSAL | Status: DC | PRN
Start: 2016-04-09 — End: 2016-04-13

## 2016-04-09 MED ORDER — BUPIVACAINE HCL (PF) 0.25 % IJ SOLN
INTRAMUSCULAR | Status: DC | PRN
  Administered 2016-04-09: 20 mL

## 2016-04-09 MED ORDER — CARBIDOPA-LEVODOPA 25-100 MG PO TABS
1.0000 | ORAL_TABLET | Freq: Three times a day (TID) | ORAL | Status: DC
  Administered 2016-04-09 – 2016-04-10 (×4): 1 via ORAL
  Filled 2016-04-09 (×7): qty 1

## 2016-04-09 MED ORDER — PROPOFOL 10 MG/ML IV BOLUS
INTRAVENOUS | Status: AC
  Filled 2016-04-09: qty 20

## 2016-04-09 MED ORDER — PROPOFOL 10 MG/ML IV BOLUS
INTRAVENOUS | Status: DC | PRN
  Administered 2016-04-09: 100 mg via INTRAVENOUS

## 2016-04-09 MED ORDER — FENTANYL CITRATE (PF) 100 MCG/2ML IJ SOLN: 25.0000 ug | INTRAMUSCULAR | Status: DC | PRN

## 2016-04-09 MED ORDER — METHOCARBAMOL 500 MG PO TABS
500.0000 mg | ORAL_TABLET | Freq: Four times a day (QID) | ORAL | Status: DC | PRN
  Administered 2016-04-10: 500 mg via ORAL
  Filled 2016-04-09: qty 1

## 2016-04-09 MED ORDER — FENTANYL CITRATE (PF) 100 MCG/2ML IJ SOLN
INTRAMUSCULAR | Status: AC
  Filled 2016-04-09: qty 2

## 2016-04-09 MED ORDER — POLYETHYLENE GLYCOL 3350 17 G PO PACK: 17.0000 g | PACK | Freq: Every day | ORAL | Status: DC | PRN

## 2016-04-09 MED ORDER — TRANEXAMIC ACID 1000 MG/10ML IV SOLN
1000.0000 mg | INTRAVENOUS | Status: AC
  Administered 2016-04-09: 1000 mg via INTRAVENOUS
  Filled 2016-04-09: qty 10

## 2016-04-09 MED ORDER — CEFAZOLIN SODIUM-DEXTROSE 2-4 GM/100ML-% IV SOLN
INTRAVENOUS | Status: AC
  Filled 2016-04-09: qty 100

## 2016-04-09 MED ORDER — HYDROMORPHONE HCL 1 MG/ML IJ SOLN: 0.5000 mg | INTRAMUSCULAR | Status: DC | PRN

## 2016-04-09 MED ORDER — ROCURONIUM BROMIDE 100 MG/10ML IV SOLN
INTRAVENOUS | Status: AC
  Filled 2016-04-09: qty 1

## 2016-04-09 MED ORDER — RIVAROXABAN 10 MG PO TABS
10.0000 mg | ORAL_TABLET | Freq: Every day | ORAL | Status: DC
  Administered 2016-04-10 – 2016-04-13 (×4): 10 mg via ORAL
  Filled 2016-04-09 (×6): qty 1

## 2016-04-09 MED ORDER — FENTANYL CITRATE (PF) 100 MCG/2ML IJ SOLN
INTRAMUSCULAR | Status: DC | PRN
  Administered 2016-04-09: 50 ug via INTRAVENOUS
  Administered 2016-04-09: 25 ug via INTRAVENOUS
  Administered 2016-04-09 (×2): 50 ug via INTRAVENOUS
  Administered 2016-04-09: 25 ug via INTRAVENOUS

## 2016-04-09 MED ORDER — VITAMIN B-12 100 MCG PO TABS
100.0000 ug | ORAL_TABLET | Freq: Every day | ORAL | Status: DC
  Administered 2016-04-10 – 2016-04-13 (×4): 100 ug via ORAL
  Filled 2016-04-09 (×5): qty 1

## 2016-04-09 MED ORDER — FLEET ENEMA 7-19 GM/118ML RE ENEM
1.0000 | ENEMA | Freq: Once | RECTAL | Status: DC | PRN
Start: 2016-04-09 — End: 2016-04-13

## 2016-04-09 MED ORDER — PANTOPRAZOLE SODIUM 40 MG PO TBEC
40.0000 mg | DELAYED_RELEASE_TABLET | Freq: Every day | ORAL | Status: DC
  Administered 2016-04-10 – 2016-04-13 (×4): 40 mg via ORAL
  Filled 2016-04-09 (×4): qty 1

## 2016-04-09 MED ORDER — MENTHOL 3 MG MT LOZG: 1.0000 | LOZENGE | OROMUCOSAL | Status: DC | PRN

## 2016-04-09 MED ORDER — LIDOCAINE HCL (CARDIAC) 20 MG/ML IV SOLN
INTRAVENOUS | Status: AC
  Filled 2016-04-09: qty 5

## 2016-04-09 MED ORDER — CEFAZOLIN SODIUM-DEXTROSE 2-4 GM/100ML-% IV SOLN
2.0000 g | INTRAVENOUS | Status: AC
  Administered 2016-04-09: 2 g via INTRAVENOUS

## 2016-04-09 MED ORDER — SODIUM CHLORIDE 0.9 % IJ SOLN
INTRAMUSCULAR | Status: AC
  Filled 2016-04-09: qty 50

## 2016-04-09 MED ORDER — ONDANSETRON HCL 4 MG/2ML IJ SOLN
INTRAMUSCULAR | Status: AC
  Filled 2016-04-09: qty 2

## 2016-04-09 MED ORDER — LACTATED RINGERS IV SOLN
INTRAVENOUS | Status: DC
  Administered 2016-04-09 – 2016-04-10 (×3): via INTRAVENOUS

## 2016-04-09 MED ORDER — CEFAZOLIN SODIUM 1-5 GM-% IV SOLN
1.0000 g | Freq: Four times a day (QID) | INTRAVENOUS | Status: AC
  Administered 2016-04-10: 1 g via INTRAVENOUS
  Filled 2016-04-09 (×2): qty 50

## 2016-04-09 MED ORDER — FERROUS SULFATE 325 (65 FE) MG PO TABS
325.0000 mg | ORAL_TABLET | Freq: Three times a day (TID) | ORAL | Status: DC
  Administered 2016-04-10 – 2016-04-13 (×9): 325 mg via ORAL
  Filled 2016-04-09 (×9): qty 1

## 2016-04-09 MED ORDER — LACTATED RINGERS IV SOLN
INTRAVENOUS | Status: DC | PRN
  Administered 2016-04-09 (×2): via INTRAVENOUS

## 2016-04-09 MED ORDER — SODIUM CHLORIDE 0.9 % IJ SOLN
INTRAMUSCULAR | Status: AC
  Filled 2016-04-09: qty 10

## 2016-04-09 MED ORDER — CLONAZEPAM 0.5 MG PO TABS
0.5000 mg | ORAL_TABLET | Freq: Every day | ORAL | Status: DC
  Administered 2016-04-09 – 2016-04-13 (×5): 0.5 mg via ORAL
  Filled 2016-04-09 (×6): qty 1

## 2016-04-09 MED ORDER — SUGAMMADEX SODIUM 200 MG/2ML IV SOLN
INTRAVENOUS | Status: DC | PRN
  Administered 2016-04-09: 150 mg via INTRAVENOUS

## 2016-04-09 MED ORDER — CHLORHEXIDINE GLUCONATE 0.12 % MT SOLN
15.0000 mL | Freq: Every day | OROMUCOSAL | Status: DC
Start: 2016-04-09 — End: 2016-04-13
  Administered 2016-04-10 – 2016-04-12 (×3): 15 mL via OROMUCOSAL
  Filled 2016-04-09 (×3): qty 15

## 2016-04-09 MED ORDER — EPHEDRINE SULFATE 50 MG/ML IJ SOLN
INTRAMUSCULAR | Status: DC | PRN
  Administered 2016-04-09 (×2): 5 mg via INTRAVENOUS

## 2016-04-09 MED ORDER — ROCURONIUM BROMIDE 100 MG/10ML IV SOLN
INTRAVENOUS | Status: DC | PRN
  Administered 2016-04-09: 30 mg via INTRAVENOUS

## 2016-04-09 MED ORDER — ALUM & MAG HYDROXIDE-SIMETH 200-200-20 MG/5ML PO SUSP: 30.0000 mL | ORAL | Status: DC | PRN

## 2016-04-09 MED ORDER — BUPIVACAINE LIPOSOME 1.3 % IJ SUSP
20.0000 mL | Freq: Once | INTRAMUSCULAR | Status: AC
  Administered 2016-04-09: 20 mL
  Filled 2016-04-09: qty 20

## 2016-04-09 MED ORDER — METHOCARBAMOL 1000 MG/10ML IJ SOLN
500.0000 mg | Freq: Four times a day (QID) | INTRAVENOUS | Status: DC | PRN
  Administered 2016-04-09 – 2016-04-10 (×2): 500 mg via INTRAVENOUS
  Filled 2016-04-09 (×2): qty 5
  Filled 2016-04-09: qty 550
  Filled 2016-04-09: qty 5

## 2016-04-09 MED ORDER — ONDANSETRON HCL 4 MG/2ML IJ SOLN
INTRAMUSCULAR | Status: DC | PRN
  Administered 2016-04-09: 4 mg via INTRAVENOUS

## 2016-04-09 MED ORDER — ACETAMINOPHEN 325 MG PO TABS
650.0000 mg | ORAL_TABLET | Freq: Four times a day (QID) | ORAL | Status: DC | PRN
  Administered 2016-04-09: 650 mg via ORAL
  Filled 2016-04-09 (×3): qty 2

## 2016-04-09 MED ORDER — SUCCINYLCHOLINE CHLORIDE 20 MG/ML IJ SOLN
INTRAMUSCULAR | Status: DC | PRN
  Administered 2016-04-09: 80 mg via INTRAVENOUS

## 2016-04-09 MED ORDER — LIDOCAINE HCL (CARDIAC) 20 MG/ML IV SOLN
INTRAVENOUS | Status: DC | PRN
  Administered 2016-04-09: 60 mg via INTRAVENOUS

## 2016-04-09 MED ORDER — DONEPEZIL HCL 5 MG PO TABS
10.0000 mg | ORAL_TABLET | Freq: Every day | ORAL | Status: DC
  Administered 2016-04-09 – 2016-04-12 (×4): 10 mg via ORAL
  Filled 2016-04-09 (×3): qty 2
  Filled 2016-04-09: qty 1

## 2016-04-09 MED ORDER — ACETAMINOPHEN 650 MG RE SUPP: 650.0000 mg | Freq: Four times a day (QID) | RECTAL | Status: DC | PRN

## 2016-04-09 MED ORDER — SODIUM CHLORIDE 0.9 % IR SOLN
Status: DC | PRN
  Administered 2016-04-09: 500 mL

## 2016-04-09 SURGICAL SUPPLY — 62 items
BAG DECANTER FOR FLEXI CONT (MISCELLANEOUS) IMPLANT
BAG ZIPLOCK 12X15 (MISCELLANEOUS) ×2 IMPLANT
BANDAGE ACE 4X5 VEL STRL LF (GAUZE/BANDAGES/DRESSINGS) ×2 IMPLANT
BANDAGE ACE 6X5 VEL STRL LF (GAUZE/BANDAGES/DRESSINGS) ×2 IMPLANT
BLADE SAG 18X100X1.27 (BLADE) ×2 IMPLANT
BLADE SAW SGTL 11.0X1.19X90.0M (BLADE) ×2 IMPLANT
BNDG COHESIVE 4X5 TAN NS LF (GAUZE/BANDAGES/DRESSINGS) IMPLANT
BONE CEMENT GENTAMICIN (Cement) ×4 IMPLANT
CAP KNEE TOTAL 3 SIGMA ×2 IMPLANT
CEMENT BONE GENTAMICIN 40 (Cement) ×2 IMPLANT
CLOTH BEACON ORANGE TIMEOUT ST (SAFETY) ×2 IMPLANT
CUFF TOURN SGL QUICK 34 (TOURNIQUET CUFF) ×1
CUFF TRNQT CYL 34X4X40X1 (TOURNIQUET CUFF) ×1 IMPLANT
DECANTER SPIKE VIAL GLASS SM (MISCELLANEOUS) ×6 IMPLANT
DRAPE INCISE IOBAN 66X45 STRL (DRAPES) IMPLANT
DRAPE U-SHAPE 47X51 STRL (DRAPES) ×2 IMPLANT
DRSG ADAPTIC 3X8 NADH LF (GAUZE/BANDAGES/DRESSINGS) IMPLANT
DRSG EMULSION OIL 3X3 NADH (GAUZE/BANDAGES/DRESSINGS) ×2 IMPLANT
DRSG PAD ABDOMINAL 8X10 ST (GAUZE/BANDAGES/DRESSINGS) IMPLANT
DURAPREP 26ML APPLICATOR (WOUND CARE) ×2 IMPLANT
ELECT REM PT RETURN 9FT ADLT (ELECTROSURGICAL) ×2
ELECTRODE REM PT RTRN 9FT ADLT (ELECTROSURGICAL) ×1 IMPLANT
EVACUATOR 1/8 PVC DRAIN (DRAIN) ×2 IMPLANT
GAUZE SPONGE 4X4 12PLY STRL (GAUZE/BANDAGES/DRESSINGS) ×2 IMPLANT
GLOVE BIOGEL PI IND STRL 6.5 (GLOVE) ×1 IMPLANT
GLOVE BIOGEL PI IND STRL 8 (GLOVE) ×1 IMPLANT
GLOVE BIOGEL PI INDICATOR 6.5 (GLOVE) ×1
GLOVE BIOGEL PI INDICATOR 8 (GLOVE) ×1
GLOVE ECLIPSE 8.0 STRL XLNG CF (GLOVE) ×4 IMPLANT
GLOVE SURG SS PI 6.5 STRL IVOR (GLOVE) ×2 IMPLANT
GOWN STRL REUS W/TWL LRG LVL3 (GOWN DISPOSABLE) ×2 IMPLANT
GOWN STRL REUS W/TWL XL LVL3 (GOWN DISPOSABLE) ×2 IMPLANT
HANDPIECE INTERPULSE COAX TIP (DISPOSABLE) ×1
HEMOSTAT SPONGE AVITENE ULTRA (HEMOSTASIS) ×2 IMPLANT
IMMOBILIZER KNEE 20 (SOFTGOODS) ×2 IMPLANT
MANIFOLD NEPTUNE II (INSTRUMENTS) ×2 IMPLANT
NEEDLE HYPO 21X1.5 SAFETY (NEEDLE) ×2 IMPLANT
NEEDLE HYPO 22GX1.5 SAFETY (NEEDLE) ×2 IMPLANT
NS IRRIG 1000ML POUR BTL (IV SOLUTION) IMPLANT
PACK TOTAL KNEE CUSTOM (KITS) ×2 IMPLANT
PAD ABD 8X10 STRL (GAUZE/BANDAGES/DRESSINGS) ×4 IMPLANT
PENCIL SMOKE EVAC W/HOLSTER (ELECTROSURGICAL) ×2 IMPLANT
POSITIONER SURGICAL ARM (MISCELLANEOUS) ×2 IMPLANT
SET HNDPC FAN SPRY TIP SCT (DISPOSABLE) ×1 IMPLANT
SET PAD KNEE POSITIONER (MISCELLANEOUS) ×2 IMPLANT
SPONGE LAP 18X18 X RAY DECT (DISPOSABLE) IMPLANT
STRIP CLOSURE SKIN 1/2X4 (GAUZE/BANDAGES/DRESSINGS) ×4 IMPLANT
SUT BONE WAX W31G (SUTURE) IMPLANT
SUT MNCRL AB 4-0 PS2 18 (SUTURE) ×2 IMPLANT
SUT VIC AB 1 CT1 27 (SUTURE) ×2
SUT VIC AB 1 CT1 27XBRD ANTBC (SUTURE) ×2 IMPLANT
SUT VIC AB 2-0 CT1 27 (SUTURE) ×3
SUT VIC AB 2-0 CT1 TAPERPNT 27 (SUTURE) ×3 IMPLANT
SUT VLOC 180 0 24IN GS25 (SUTURE) ×2 IMPLANT
SYR 20CC LL (SYRINGE) ×4 IMPLANT
TOWER CARTRIDGE SMART MIX (DISPOSABLE) ×2 IMPLANT
TRAY FOLEY W/METER SILVER 14FR (SET/KITS/TRAYS/PACK) ×2 IMPLANT
TRAY FOLEY W/METER SILVER 16FR (SET/KITS/TRAYS/PACK) IMPLANT
WATER STERILE IRR 1000ML POUR (IV SOLUTION) ×4 IMPLANT
WATER STERILE IRR 1500ML POUR (IV SOLUTION) IMPLANT
WRAP KNEE MAXI GEL POST OP (GAUZE/BANDAGES/DRESSINGS) ×2 IMPLANT
YANKAUER SUCT BULB TIP 10FT TU (MISCELLANEOUS) ×2 IMPLANT

## 2016-04-09 NOTE — Anesthesia Procedure Notes (Signed)
Procedure Name: Intubation Date/Time: 04/09/2016 12:09 PM Performed by: Maxwell Caul Pre-anesthesia Checklist: Patient identified, Emergency Drugs available, Suction available and Patient being monitored Patient Re-evaluated:Patient Re-evaluated prior to inductionOxygen Delivery Method: Circle system utilized Preoxygenation: Pre-oxygenation with 100% oxygen Intubation Type: IV induction Ventilation: Mask ventilation without difficulty Laryngoscope Size: Mac and 4 Grade View: Grade I Tube type: Oral Tube size: 7.0 mm Number of attempts: 1 Airway Equipment and Method: Stylet and Oral airway Placement Confirmation: ETT inserted through vocal cords under direct vision,  positive ETCO2 and breath sounds checked- equal and bilateral Secured at: 21 cm Tube secured with: Tape Dental Injury: Teeth and Oropharynx as per pre-operative assessment

## 2016-04-09 NOTE — Interval H&P Note (Signed)
History and Physical Interval Note:  04/09/2016 11:40 AM  Stacey Jefferson  has presented today for surgery, with the diagnosis of LEFT KNEE OA   The various methods of treatment have been discussed with the patient and family. After consideration of risks, benefits and other options for treatment, the patient has consented to  Procedure(s): LEFT TOTAL KNEE ARTHROPLASTY (Left) as a surgical intervention .  The patient's history has been reviewed, patient examined, no change in status, stable for surgery.  I have reviewed the patient's chart and labs.  Questions were answered to the patient's satisfaction.     [name] A

## 2016-04-09 NOTE — Anesthesia Postprocedure Evaluation (Signed)
Anesthesia Post Note  Patient: Stacey Jefferson  Procedure(s) Performed: Procedure(s) (LRB): LEFT TOTAL KNEE ARTHROPLASTY (Left)  Patient location during evaluation: PACU Anesthesia Type: General Level of consciousness: awake and alert Pain management: pain level controlled Vital Signs Assessment: post-procedure vital signs reviewed and stable Respiratory status: spontaneous breathing, nonlabored ventilation, respiratory function stable and patient connected to nasal cannula oxygen Cardiovascular status: blood pressure returned to baseline and stable Postop Assessment: no signs of nausea or vomiting Anesthetic complications: no    Last Vitals:  Filed Vitals:   04/09/16 1645 04/09/16 1745  BP: 148/72 138/71  Pulse: 69   Temp: 36.5 C 36.6 C  Resp: 18     Last Pain:  Filed Vitals:   04/09/16 1751  PainSc: Asleep    LLE Motor Response: Purposeful movement (04/09/16 1745) LLE Sensation: No pain (04/09/16 1745)          Oriyah Lamphear L

## 2016-04-09 NOTE — Brief Op Note (Signed)
04/09/2016  1:55 PM  PATIENT:  Stacey Jefferson  82 y.o. female  PRE-OPERATIVE DIAGNOSIS:  LEFT KNEE Primary  OA   POST-OPERATIVE DIAGNOSIS:  LEFT KNEE Primary OA   PROCEDURE:  Procedure(s): LEFT TOTAL KNEE ARTHROPLASTY (Left)  SURGEON:  Surgeon(s) and Role:    * Latanya Maudlin, MD - Primary  PHYSICIAN ASSISTANT: Ardeen Jourdain PA  ASSISTANTS: Ardeen Jourdain PA  ANESTHESIA:   general  EBL:  Total I/O In: 1000 [I.V.:1000] Out: 350 [Urine:300; Blood:50]  BLOOD ADMINISTERED:none  DRAINS: (One) Hemovact drain(s) in the Left with  Suction Open   LOCAL MEDICATIONS USED:  MARCAINE  20cc of 0'25% Plain and Exparel 20cc mixed with 20cc of Normal Saline.   SPECIMEN:  No Specimen  DISPOSITION OF SPECIMEN:  N/A  COUNTS:  YES  TOURNIQUET:   Total Tourniquet Time Documented: Thigh (Left) - 68 minutes Total: Thigh (Left) - 68 minutes   DICTATION: .Other Dictation: Dictation Number [id number]  PLAN OF CARE: Admit to inpatient   PATIENT DISPOSITION:  Stable in OR   Delay start of Pharmacological VTE agent (>24hrs) due to surgical blood loss or risk of bleeding: yes

## 2016-04-09 NOTE — Transfer of Care (Signed)
Immediate Anesthesia Transfer of Care Note  Patient: Stacey Jefferson  Procedure(s) Performed: Procedure(s): LEFT TOTAL KNEE ARTHROPLASTY (Left)  Patient Location: PACU  Anesthesia Type:General  Level of Consciousness:  sedated, patient cooperative and responds to stimulation  Airway & Oxygen Therapy:Patient Spontanous Breathing and Patient connected to face mask oxgen  Post-op Assessment:  Report given to PACU RN and Post -op Vital signs reviewed and stable  Post vital signs:  Reviewed and stable  Last Vitals:  Filed Vitals:   04/09/16 0956  BP: 114/50  Pulse: 55  Temp: 37.1 C  Resp: 18    Complications: No apparent anesthesia complications

## 2016-04-10 ENCOUNTER — Encounter (HOSPITAL_COMMUNITY): Payer: Self-pay | Admitting: Orthopedic Surgery

## 2016-04-10 DIAGNOSIS — L8990 Pressure ulcer of unspecified site, unspecified stage: Secondary | ICD-10-CM | POA: Insufficient documentation

## 2016-04-10 DIAGNOSIS — I480 Paroxysmal atrial fibrillation: Secondary | ICD-10-CM

## 2016-04-10 LAB — CBC
HCT: 28.1 % — ABNORMAL LOW (ref 36.0–46.0)
Hemoglobin: 9.3 g/dL — ABNORMAL LOW (ref 12.0–15.0)
MCH: 30.6 pg (ref 26.0–34.0)
MCHC: 33.1 g/dL (ref 30.0–36.0)
MCV: 92.4 fL (ref 78.0–100.0)
Platelets: 101 10*3/uL — ABNORMAL LOW (ref 150–400)
RBC: 3.04 MIL/uL — ABNORMAL LOW (ref 3.87–5.11)
RDW: 13 % (ref 11.5–15.5)
WBC: 5.6 10*3/uL (ref 4.0–10.5)

## 2016-04-10 LAB — TROPONIN I

## 2016-04-10 LAB — BASIC METABOLIC PANEL WITH GFR
Anion gap: 5 (ref 5–15)
BUN: 12 mg/dL (ref 6–20)
CO2: 27 mmol/L (ref 22–32)
Calcium: 8.1 mg/dL — ABNORMAL LOW (ref 8.9–10.3)
Chloride: 108 mmol/L (ref 101–111)
Creatinine, Ser: 0.83 mg/dL (ref 0.44–1.00)
GFR calc Af Amer: >60 mL/min
GFR calc non Af Amer: >60 mL/min
Glucose, Bld: 141 mg/dL — ABNORMAL HIGH (ref 65–99)
Potassium: 3.8 mmol/L (ref 3.5–5.1)
Sodium: 140 mmol/L (ref 135–145)

## 2016-04-10 LAB — TSH: TSH: 0.899 u[IU]/mL (ref 0.350–4.500)

## 2016-04-10 MED ORDER — OXYCODONE-ACETAMINOPHEN 5-325 MG PO TABS: 1.0000 | ORAL_TABLET | ORAL | Status: DC | PRN

## 2016-04-10 MED ORDER — HYDROMORPHONE HCL 1 MG/ML IJ SOLN: 0.5000 mg | INTRAMUSCULAR | Status: DC | PRN

## 2016-04-10 MED ORDER — METHOCARBAMOL 500 MG PO TABS
500.0000 mg | ORAL_TABLET | Freq: Four times a day (QID) | ORAL | Status: AC | PRN
Start: 2016-04-10 — End: ?

## 2016-04-10 MED ORDER — ACETAMINOPHEN 160 MG/5ML PO SOLN
650.0000 mg | Freq: Four times a day (QID) | ORAL | Status: DC | PRN
  Administered 2016-04-10 (×2): 649.6 mg via ORAL
  Administered 2016-04-10 – 2016-04-13 (×8): 650 mg via ORAL
  Filled 2016-04-10 (×11): qty 20.3

## 2016-04-10 MED ORDER — METOPROLOL TARTRATE 5 MG/5ML IV SOLN
2.5000 mg | Freq: Once | INTRAVENOUS | Status: AC
  Administered 2016-04-10: 2.5 mg via INTRAVENOUS
  Filled 2016-04-10: qty 5

## 2016-04-10 MED ORDER — DIGOXIN 0.25 MG/ML IJ SOLN
0.2500 mg | Freq: Once | INTRAMUSCULAR | Status: AC
  Administered 2016-04-10: 0.25 mg via INTRAVENOUS
  Filled 2016-04-10: qty 1

## 2016-04-10 NOTE — Discharge Instructions (Signed)
INSTRUCTIONS AFTER JOINT REPLACEMENT  ° °o Remove items at home which could result in a fall. This includes throw rugs or furniture in walking pathways °o ICE to the affected joint every three hours while awake for 30 minutes at a time, for at least the first 3-5 days, and then as needed for pain and swelling.  Continue to use ice for pain and swelling. You may notice swelling that will progress down to the foot and ankle.  This is normal after surgery.  Elevate your leg when you are not up walking on it.   °o Continue to use the breathing machine you got in the hospital (incentive spirometer) which will help keep your temperature down.  It is common for your temperature to cycle up and down following surgery, especially at night when you are not up moving around and exerting yourself.  The breathing machine keeps your lungs expanded and your temperature down. ° ° °DIET:  As you were doing prior to hospitalization, we recommend a well-balanced diet. ° °DRESSING / WOUND CARE / SHOWERING ° °You may change your dressing every day with sterile gauze.  Please use good hand washing techniques before changing the dressing.  Do not use any lotions or creams on the incision until instructed by your surgeon. ° °ACTIVITY ° °o Increase activity slowly as tolerated, but follow the weight bearing instructions below.   °o No driving for 6 weeks or until further direction given by your physician.  You cannot drive while taking narcotics.  °o No lifting or carrying greater than 10 lbs. until further directed by your surgeon. °o Avoid periods of inactivity such as sitting longer than an hour when not asleep. This helps prevent blood clots.  °o You may return to work once you are authorized by your doctor.  ° ° ° °WEIGHT BEARING  ° °Weight bearing as tolerated with assist device (walker, cane, etc) as directed, use it as long as suggested by your surgeon or therapist, typically at least 4-6 weeks. ° ° °EXERCISES ° °Results after joint  replacement surgery are often greatly improved when you follow the exercise, range of motion and muscle strengthening exercises prescribed by your doctor. Safety measures are also important to protect the joint from further injury. Any time any of these exercises cause you to have increased pain or swelling, decrease what you are doing until you are comfortable again and then slowly increase them. If you have problems or questions, call your caregiver or physical therapist for advice.  ° °Rehabilitation is important following a joint replacement. After just a few days of immobilization, the muscles of the leg can become weakened and shrink (atrophy).  These exercises are designed to build up the tone and strength of the thigh and leg muscles and to improve motion. Often times heat used for twenty to thirty minutes before working out will loosen up your tissues and help with improving the range of motion but do not use heat for the first two weeks following surgery (sometimes heat can increase post-operative swelling).  ° °These exercises can be done on a training (exercise) mat, on the floor, on a table or on a bed. Use whatever works the best and is most comfortable for you.    Use music or television while you are exercising so that the exercises are a pleasant break in your day. This will make your life better with the exercises acting as a break in your routine that you can look forward to.     Perform all exercises about fifteen times, three times per day or as directed.  You should exercise both the operative leg and the other leg as well.  Exercises include:    Quad Sets - Tighten up the muscle on the front of the thigh (Quad) and hold for 5-10 seconds.    Straight Leg Raises - With your knee straight (if you were given a brace, keep it on), lift the leg to 60 degrees, hold for 3 seconds, and slowly lower the leg.  Perform this exercise against resistance later as your leg gets stronger.   Leg Slides:  Lying on your back, slowly slide your foot toward your buttocks, bending your knee up off the floor (only go as far as is comfortable). Then slowly slide your foot back down until your leg is flat on the floor again.   Angel Wings: Lying on your back spread your legs to the side as far apart as you can without causing discomfort.   Hamstring Strength:  Lying on your back, push your heel against the floor with your leg straight by tightening up the muscles of your buttocks.  Repeat, but this time bend your knee to a comfortable angle, and push your heel against the floor.  You may put a pillow under the heel to make it more comfortable if necessary.   A rehabilitation program following joint replacement surgery can speed recovery and prevent re-injury in the future due to weakened muscles. Contact your doctor or a physical therapist for more information on knee rehabilitation.    CONSTIPATION  Constipation is defined medically as fewer than three stools per week and severe constipation as less than one stool per week.  Even if you have a regular bowel pattern at home, your normal regimen is likely to be disrupted due to multiple reasons following surgery.  Combination of anesthesia, postoperative narcotics, change in appetite and fluid intake all can affect your bowels.   YOU MUST use at least one of the following options; they are listed in order of increasing strength to get the job done.  They are all available over the counter, and you may need to use some, POSSIBLY even all of these options:    Drink plenty of fluids (prune juice may be helpful) and high fiber foods Colace 100 mg by mouth twice a day  Senokot for constipation as directed and as needed Dulcolax (bisacodyl), take with full glass of water  Miralax (polyethylene glycol) once or twice a day as needed.  If you have tried all these things and are unable to have a bowel movement in the first 3-4 days after surgery call either your  surgeon or your primary doctor.    If you experience loose stools or diarrhea, hold the medications until you stool forms back up.  If your symptoms do not get better within 1 week or if they get worse, check with your doctor.  If you experience "the worst abdominal pain ever" or develop nausea or vomiting, please contact the office immediately for further recommendations for treatment.   ITCHING:  If you experience itching with your medications, try taking only a single pain pill, or even half a pain pill at a time.  You can also use Benadryl over the counter for itching or also to help with sleep.   TED HOSE STOCKINGS:  Use stockings on both legs until for at least 2 weeks or as directed by physician office. They may be removed at night for  sleeping. ° °MEDICATIONS:  See your medication summary on the “After Visit Summary” that nursing will review with you.  You may have some home medications which will be placed on hold until you complete the course of blood thinner medication.  It is important for you to complete the blood thinner medication as prescribed. ° °PRECAUTIONS:  If you experience chest pain or shortness of breath - call 911 immediately for transfer to the hospital emergency department.  ° °If you develop a fever greater that 101 F, purulent drainage from wound, increased redness or drainage from wound, foul odor from the wound/dressing, or calf pain - CONTACT YOUR SURGEON.   °                                                °FOLLOW-UP APPOINTMENTS:  If you do not already have a post-op appointment, please call the office for an appointment to be seen by your surgeon.  Guidelines for how soon to be seen are listed in your “After Visit Summary”, but are typically between 1-4 weeks after surgery. ° °OTHER INSTRUCTIONS:  ° °Knee Replacement:  Do not place pillow under knee, focus on keeping the knee straight while resting. CPM instructions: 0-90 degrees, 2 hours in the morning, 2 hours in the  afternoon, and 2 hours in the evening. Place foam block, curve side up under heel at all times except when in CPM or when walking.  DO NOT modify, tear, cut, or change the foam block in any way. ° °MAKE SURE YOU:  °• Understand these instructions.  °• Get help right away if you are not doing well or get worse.  ° ° °Thank you for letting us be a part of your medical care team.  It is a privilege we respect greatly.  We hope these instructions will help you stay on track for a fast and full recovery!  ° ° ° °

## 2016-04-10 NOTE — Progress Notes (Signed)
Occupational Therapy Treatment Note  Pt requires max A +2 for stand pivot transfers and 3rd person to assist with toileting.  HR 127 max, with BP 111/54, 02 sats >94%.  She remains confused and following commands intermittently.   Unsure if family will be able to provide necessary level of care at discharge.     04/10/16 1600  OT Visit Information  Last OT Received On 04/10/16  Assistance Needed +3 or more  PT/OT/SLP Co-Evaluation/Treatment Yes  Reason for Co-Treatment For patient/therapist safety  OT goals addressed during session ADL's and self-care  History of Present Illness LTKA  Precautions  Precautions Fall;Knee  Required Braces or Orthoses Knee Immobilizer - Left  Knee Immobilizer - Left Discontinue once straight leg raise with < 10 degree lag  Pain Assessment  Pain Assessment Faces  Faces Pain Scale 4  Pain Location Lt knee   Pain Descriptors / Indicators Grimacing;Guarding  Cognition  Arousal/Alertness Awake/alert;Lethargic  Behavior During Therapy Flat affect  Overall Cognitive Status Impaired/Different from baseline  Area of Impairment Attention;Safety/judgement;Problem solving  Current Attention Level Focused;Sustained  Following Commands Follows one step commands inconsistently  Safety/Judgement Decreased awareness of safety  ADL  Overall ADL's  Needs assistance/impaired  Toilet Transfer Maximal assistance;+2 for physical assistance;Stand-pivot;BSC;RW  Toilet Transfer Details (indicate cue type and reason) Pt able to pivot to [facility], and stood wtih max A +2 x 3 for peri care   Toileting- Clothing Manipulation and Hygiene Total assistance;Sit to/from stand;+2 for physical assistance  General ADL Comments Pt remains confused.  Moves slowly, and fatigues rapidly   Bed Mobility  Overal bed mobility Needs Assistance  Bed Mobility Sit to Supine  Sit to supine Total assist  General bed mobility comments requires assist for all aspects   Balance  Overall balance  assessment Needs assistance  Sitting-balance support Feet supported  Sitting balance-Leahy Scale Poor  Sitting balance - Comments mod - max A to move to EOC  Standing balance support Bilateral upper extremity supported  Standing balance-Leahy Scale Poor  Standing balance comment Pt requires max A =2 to maintain standing balance  Transfers  Overall transfer level Needs assistance  Equipment used Rolling walker (2 wheeled)  Transfers Sit to/from [employer] Transfers  Sit to Stand Max assist;+2 physical assistance  Stand pivot transfers +2 physical assistance;Max assist  General transfer comment Pt requires assist for all aspects.  She fatigues quickly requiring assist to maintain standing while performing peri care   OT - End of Session  Equipment Utilized During Treatment Left knee immobilizer;Gait belt;Rolling walker  Activity Tolerance Patient limited by fatigue;Patient limited by pain  Patient left in bed;with call bell/phone within reach;with family/visitor present;with nursing/sitter in room  Nurse Communication Mobility status  OT Assessment/Plan  OT Plan Discharge plan remains appropriate  OT Frequency (ACUTE ONLY) Min 2X/week  Follow Up Recommendations Home health OT;Supervision/Assistance - 24 hour  OT Equipment None recommended by OT  OT Goal Progression  Progress towards OT goals Progressing toward goals  ADL Goals  Pt Will Perform Upper Body Bathing with min assist;sitting  Pt Will Transfer to Toilet with mod assist;stand pivot transfer;bedside commode  Pt Will Perform Toileting - Clothing Manipulation and hygiene with mod assist;sit to/from stand  OT Time Calculation  OT Start Time (ACUTE ONLY) 1412  OT Stop Time (ACUTE ONLY) 1445  OT Time Calculation (min) 33 min  OT General Charges  $OT Visit 1 Procedure  OT Treatments  $Self Care/Home Management  8-22 mins  Naomii Kreger, OTR/L  [phone]  

## 2016-04-10 NOTE — Care Management Note (Signed)
Case Management Note  Patient Details  Name: TIFFANIE BLASSINGAME MRN: [id number] Date of Birth: [birth year]  Subjective/Objective:                  LEFT TOTAL KNEE ARTHROPLASTY (Left) Action/Plan: Discharge planning Expected Discharge Date:                 Expected Discharge Plan:  [place]  In-House Referral:     Discharge planning Services  CM Consult  Post Acute Care Choice:    Choice offered to:  Adult Children, Patient  DME Arranged:  N/A DME Agency:  NA  HH Arranged:  PT HH Agency:  [facility]  Status of Service:  Completed, signed off  Medicare Important Message Given:    Date Medicare IM Given:    Medicare IM give by:    Date Additional Medicare IM Given:    Additional Medicare Important Message give by:     If discussed at [facility] of Stay Meetings, dates discussed:    Additional Comments: Cm met with pt and daughter in room to offer choice of home health agency.  Pt requests [facility] to render HHPT.  Referral made to [facility] rep, Tim with specific notation of [facility] for PT.  Pt has all DME needed at home including shower stool, rolling walker, 3n1.  NO other Cm needs were communicated. Dellie Catholic, RN 04/10/2016, 2:27 PM

## 2016-04-10 NOTE — Consult Note (Signed)
[facility] Medical Consultation  Stacey Jefferson [id number] DOB: February 01, 1925 DOA: 04/09/2016 PCP: Mathews Argyle, MD   Requesting physician: Dr. Jackquline Bosch Date of consultation: April 10, 2016 Reason for consultation: Post-operative atrial fibrillation with RVR  Impression/Recommendations Active Problems:   Status post total left knee replacement   Pressure ulcer Post-operative atrial fibrillation with RVR   Atrial fibrillation with RVR --Transfer to telemetry (already done) --IV metoprolol 2.5mg  x 1 has helped with rate but patient still tachycardic 110's - 120's.  Will start digoxin load now and monitor.  Can move to higher level of care for cardizem drip if needed (patient's daughter reports that she tolerated IV cardizem last year but had adverse reaction to the oral form) --Continue home dose of bisoprolol for now --Continue current dose of Xarelto for now (she is on it for DVT prophylaxis).  She has a CHADS-Vasc score of 3, so risk vs benefit of long term anticoagulation will need to be addressed. --Check troponin, TFTs, complete echo --Consider cardiology consult if abnormal findings  [facility] will followup again tomorrow.  Thank you for this consultation.  Chief Complaint: Post-operative atrial fibrillation with RVR  HPI:  Stacey Jefferson is an 82yo woman admitted to the orthopedic service S/P left knee replacement, POD #1.  Hospitalist asked to consult for atrial fibrillation with RVR.  The patient is currently asymptomatic.  Her daughter is at bedside and assists with history.  The patient does not have any history of CAD/MI, CHF, CVA, or diabetes.   No history of PUD, GI bleeding.  She had one prior episode of paroxysmal atrial fibrillation in Nov/Dec 2016 while admitted to [facility] for EGD (history of esophageal stricture), complicated by esophageal perforation.  She never followed up with a cardiologist.  She tolerated IV cardizem in the hospital, but had  an intolerance to the oral medication.  She converted to NSR and has been maintained on bisoprolol by her PCP (she had increased lethargy with metoprolol).  She has not been on long term anticoagulation.  Review of Systems: Limited ROS negative except as stated in HPI.  Past Medical History  Diagnosis Date  . Weight loss   . Vomiting   . Dysphagia, unspecified(787.20)   . Memory loss   . Hypotension, unspecified   . Irritable bowel syndrome   . Restless legs syndrome (RLS)   . Carpal tunnel syndrome   . Abnormality of gait     uses rollator and walker at home for ambulation  . Headache   . Pain in limb   . Polyneuropathy in other diseases classified elsewhere ([place])   . Hypertension   . GERD (gastroesophageal reflux disease)   . Arthritis   . Cancer ([place])     skin cancer left cheek with Mohrs  . HOH (hard of hearing)     bilateral hearing aids  . Eyelid abnormality     left eyelid droops(common)  . Hx of seasonal allergies   . Dysrhythmia     Episode of atrial fibrillation (Stable)after esophageal tear s/p Endoscopy at [facility]-records with cahrt.   Past Surgical History  Procedure Laterality Date  . Bladder aspiration    . Carpal tunnel release Right   . Squamous cell carcinoma excision    . Abdominal hysterectomy    . Eye surgery      bilateral cataract surgery with lens implants  . Mouth implants  1991  . Total knee arthroplasty Bilateral 05/10/2015    Procedure: TOTAL RIGHT  KNEE ARTHROPLASTY ;  LEFT KNEE STEROID INJECTION ;  Surgeon: Latanya Maudlin, MD;  Location: [facility] ORS;  Service: Orthopedics;  Laterality: Bilateral;  . Total knee arthroplasty Left 04/09/2016    Procedure: LEFT TOTAL KNEE ARTHROPLASTY;  Surgeon: Latanya Maudlin, MD;  Location: [facility] ORS;  Service: Orthopedics;  Laterality: Left;   Social History:  reports that she has quit smoking. Her smoking use included Cigarettes. She has never used smokeless tobacco. She reports that she drinks alcohol. She reports  that she does not use illicit drugs.  Allergies  Allergen Reactions  . Clarithromycin Diarrhea  . Diltiazem Itching    "itching, rash"  . Sulfa Antibiotics Other (See Comments)    Reaction unknown   Family History  Problem Relation Age of Onset  . Dementia Mother   . Stroke Father     Prior to Admission medications   Medication Sig Start Date End Date Taking? Authorizing Provider  acetaminophen (TYLENOL) 160 MG/5ML solution Take 20.3 mLs (650 mg total) by mouth every 6 (six) hours as needed for mild pain, headache or fever. 05/12/15  Yes Amber Cecilio Asper, PA-C  aspirin 81 MG chewable tablet Chew 81 mg by mouth daily.   Yes Historical Provider, MD  bisoprolol (ZEBETA) 5 MG tablet Take 5 mg by mouth at bedtime.    Yes Historical Provider, MD  carbidopa-levodopa (SINEMET) 25-100 MG per tablet Take 1 tablet by mouth 3 (three) times daily. Patient taking differently: Take 1 tablet by mouth at bedtime.  04/07/15  Yes Donika K Patel, DO  chlorhexidine (PERIDEX) 0.12 % solution Use as directed 15 mLs in the mouth or throat daily.  05/04/15  Yes Historical Provider, MD  clonazePAM (KLONOPIN) 0.5 MG tablet Take 1 tablet (0.5 mg total) by mouth at bedtime as needed. Patient taking differently: Take 0.5 mg by mouth at bedtime.  05/06/14  Yes Donika K Patel, DO  Cyanocobalamin (VITAMIN B 12 PO) Take 1 tablet by mouth daily.    Yes Historical Provider, MD  donepezil (ARICEPT) 10 MG tablet Take 1 tablet (10 mg total) by mouth at bedtime. 05/30/15  Yes Alda Berthold, DO  liver oil-zinc oxide (DESITIN) 40 % ointment Apply 1 application topically as needed for irritation (to bottom after each BM-using a generic ' Butt paste w zinc").   Yes Historical Provider, MD  loperamide (IMODIUM) 1 MG/5ML solution Take 2 mg by mouth as needed for diarrhea or loose stools.   Yes Historical Provider, MD  loratadine (CLARITIN) 10 MG tablet Take 10 mg by mouth daily.   Yes Historical Provider, MD  methocarbamol (ROBAXIN) 500  MG tablet Take 1 tablet (500 mg total) by mouth every 6 (six) hours as needed for muscle spasms. Patient taking differently: Take 250-500 mg by mouth 2 (two) times daily. Take half a tablet in the morning and one tablet in the afternoon. 05/12/15  Yes Amber Cecilio Asper, PA-C  omeprazole (PRILOSEC) 20 MG capsule Take 20 mg by mouth 2 (two) times daily. Opens capsule and mixes in with food or water   Yes Historical Provider, MD  Polyethyl Glycol-Propyl Glycol (SYSTANE OP) Place 1 drop into both eyes 3 (three) times daily.    Yes Historical Provider, MD  Salicylic Acid (SALACYN) 6 % LOTN Apply 1 application topically as needed (for legs).    Yes Historical Provider, MD  Wheat Dextrin (BENEFIBER) POWD Take 10 mLs by mouth 2 (two) times daily.   Yes Historical Provider, MD  methocarbamol (ROBAXIN) 500 MG tablet Take 1 tablet (500 mg total)  by mouth every 6 (six) hours as needed for muscle spasms. 04/10/16   Ardeen Jourdain, PA-C   Physical Exam: Blood pressure 105/52, pulse 110, temperature 101.5 F (38.6 C), temperature source Oral, resp. rate 16, height 5\' 1"  (1.549 m), weight 63.504 kg (140 lb), SpO2 99 %. Filed Vitals:   04/10/16 1623 04/10/16 1946  BP:  105/52  Pulse: 126 110  Temp:  101.5 F (38.6 C)  Resp:  16     General:  Awake and alert, NAD  Eyes: Pupils react bilaterally  ENT: Mucous membranes are moist, normal dentition  Neck: Supple  Cardiovascular: Irregular and tachycardic, no significant lower extremity edema  Respiratory: breathing unlabored.  No wheeze or ronchi listening anteriorly  Abdomen: soft, NT, ND, bowel sounds are present  Skin: Warm and dry  Musculoskeletal: Left knee in immobilizer, moves all others without difficulty  Psychiatric: Normal insight, affect appropriate Neurologic: No new focal deficits, vision impairment at baseline  Labs on Admission:  Basic Metabolic Panel:  Recent Labs Lab 04/10/16 0400  NA 140  K 3.8  CL 108  CO2 27  GLUCOSE  141*  BUN 12  CREATININE 0.83  CALCIUM 8.1*   CBC:  Recent Labs Lab 04/10/16 0400  WBC 5.6  HGB 9.3*  HCT 28.1*  MCV 92.4  PLT 101*   Cardiac Enzymes:  Recent Labs Lab 04/10/16 2126  TROPONINI <0.03    EKG: Independently reviewed. Atrial fibrillation with RVR, Rate 128  Time spent: 45 minutes  [facility]   If 7PM-7AM, please contact night-coverage [email or web address] Password [facility] 04/10/2016, 10:15 PM

## 2016-04-10 NOTE — Op Note (Signed)
Stacey Jefferson, Stacey Jefferson NO.:  [id number]  MEDICAL RECORD NO.:  [id number]  LOCATION:  [id number]                         FACILITY:  [facility]  PHYSICIAN:  Kipp Brood. Aiana Nordquist, M.D.DATE OF BIRTH:  03/01/1925  DATE OF PROCEDURE: DATE OF DISCHARGE:                              OPERATIVE REPORT   SURGEON:  Daiel Strohecker A. Gladstone Lighter, M.D.  ASSISTANT:  Ardeen Jourdain, [place].  PREOPERATIVE DIAGNOSES: 1. Bone-on-bone primary osteoarthritis of the left knee. 2. Flexion contracture of the left knee.  POSTOPERATIVE DIAGNOSES: 1. Bone-on-bone primary osteoarthritis of the left knee. 2. Flexion contracture of the left knee.  OPERATION:  Left total knee arthroplasty utilizing DePuy system.  All three components were cemented.  Gentamicin was used in the cement.  The sizes used was a size 2.5 left femoral component posterior cruciate sacrificing type.  The tibial tray was a size 2.  The insert was a size 2.5, 12.5-mm thickness.  The patella was a size 38 with 3 pegs.  DESCRIPTION OF PROCEDURE:  First, the appropriate time-out was carried out.  I also marked the appropriate left leg in the holding area.  At this time, after sterile prep and draping, and a time-out, the leg was exsanguinated with an Esmarch and tourniquet was elevated to 300 mmHg. Following that, the left leg was placed in a DeMayo knee holder. Anterior approach of the knee was carried out.  Bleeders were identified and cauterized and two flaps were created.  I then carried out a median parapatellar incision, reflected the patella laterally.  I then did medial and lateral meniscectomies and excised the anterior and posterior cruciate ligaments.  I removed number of spurs from the femur and patella and tibia.  At this time, initial drill hole was made in the intercondylar notch.  The guide rod was inserted.  We were well up into the canal.  I then thoroughly irrigated out the canal after the canal finder was removed.  We then  removed 11 mm thickness off the distal femur at 5 degree valgus angle.  Following that, we then measured the femur to be a size 2.5.  We made the anterior, posterior, and chamfering cuts for a size 2.5 of left femoral component.  Next, attention was directed to the tibial plateau.  At this time, initial drill hole was made in the plateau.  We then inserted our guide rod and then removed 6- mm thickness off the affected medial side.  At this particular point, we then inserted our lamina spreaders and removed two large spurs from the posterior femoral condyles, one on each side.  We then completed our lateral meniscectomy.  Following that, we then inserted our spacer blocks and felt that the 12.5 would be our best fit.  We then continued the repair of the tibial plateau.  We cut our keel cut out of the tibial plateau, femoral cut, and for the notch cut then was carried out.  The trial components also were inserted.  We first tried the 10-mm thickness insert, it was too loose.  We then inserted 12.5, we had a perfect fit. We then reduced the knee.  Took the knee through motion  and then finally did a resurfacing procedure on the patella for a size 38 patella.  Three drill holes were made in the articular surface of the patella, and we then went through the trial patella and it fit quite nicely.  We then removed all trial components, thoroughly water picked out the knee, dried the knee out, cemented all three components in simultaneously with gentamicin and cement.  Once the cement was thoroughly hardened, we removed all loose pieces of cement.  We then water picked out the knee to make sure there were no other loose fragments present.  I then injected 20 mL of 0.25% Marcaine plain into the soft tissue, especially the medial structures.  Following that, I then inserted a Hemovac drain and a prior to doing that, I inserted my permanent rotating platform, 12.5 mm thickness size 2.5, and reduced  the knee.  We had excellent function.  At that time, I then inserted a Hemovac drain, closed the knee in layers in usual fashion.  She had Ancef 2 g preop.          ______________________________ Kipp Brood. Gladstone Lighter, M.D.     RAG/MEDQ  D:  04/09/2016  T:  04/10/2016  Job:  [id number]

## 2016-04-10 NOTE — Progress Notes (Signed)
   Subjective: 1 Day Post-Op Procedure(s) (LRB): LEFT TOTAL KNEE ARTHROPLASTY (Left) Patient reports pain as mild.   Patient seen in rounds with Dr. Gladstone Lighter. Patient is well, and has had no acute complaints or problems. No SOB or chest pain. No issues overnight. Plan is to go Home after hospital stay.  Objective: Vital signs in last 24 hours: Temp:  [97.6 F (36.4 C)-98.8 F (37.1 C)] 98.2 F (36.8 C) (06/14 0500) Pulse Rate:  [55-81] 68 (06/14 0500) Resp:  [13-18] 16 (06/14 0500) BP: (109-158)/(39-72) 120/46 mmHg (06/14 0500) SpO2:  [96 %-100 %] 100 % (06/14 0500) Weight:  [63.504 kg (140 lb)] 63.504 kg (140 lb) (06/13 1018)  Intake/Output from previous day:  Intake/Output Summary (Last 24 hours) at 04/10/16 0746 Last data filed at 04/10/16 0700  Gross per 24 hour  Intake   3855 ml  Output   2200 ml  Net   1655 ml     Labs:  Recent Labs  04/10/16 0400  HGB 9.3*    Recent Labs  04/10/16 0400  WBC 5.6  RBC 3.04*  HCT 28.1*  PLT 101*    Recent Labs  04/10/16 0400  NA 140  K 3.8  CL 108  CO2 27  BUN 12  CREATININE 0.83  GLUCOSE 141*  CALCIUM 8.1*    EXAM General - Patient is Alert and Oriented Extremity - Neurologically intact Intact pulses distally Dorsiflexion/Plantar flexion intact No cellulitis present Compartment soft Dressing - no drainage Motor Function - intact, moving foot and toes well on exam.  Hemovac pulled without difficulty.  Past Medical History  Diagnosis Date  . Weight loss   . Vomiting   . Dysphagia, unspecified(787.20)   . Memory loss   . Hypotension, unspecified   . Irritable bowel syndrome   . Restless legs syndrome (RLS)   . Carpal tunnel syndrome   . Abnormality of gait     uses rollator and walker at home for ambulation  . Headache   . Pain in limb   . Polyneuropathy in other diseases classified elsewhere ([place])   . Hypertension   . GERD (gastroesophageal reflux disease)   . Arthritis   . Cancer ([place])    skin cancer left cheek with Mohrs  . HOH (hard of hearing)     bilateral hearing aids  . Eyelid abnormality     left eyelid droops(common)  . Hx of seasonal allergies   . Dysrhythmia     Episode of atrial fibrillation (Stable)after esophageal tear s/p Endoscopy at [facility]-records with cahrt.    Assessment/Plan: 1 Day Post-Op Procedure(s) (LRB): LEFT TOTAL KNEE ARTHROPLASTY (Left) Active Problems:   Status post total left knee replacement  Estimated body mass index is 26.47 kg/(m^2) as calculated from the following:   Height as of this encounter: 5\' 1"  (1.549 m).   Weight as of this encounter: 63.504 kg (140 lb). Advance diet Up with therapy D/C IV fluids when tolerating POs well  DVT Prophylaxis - Xarelto Weight-Bearing as tolerated D/C O2 and Pulse OX and try on Room Air  Doing fair this morning. Plan for therapy today. Hopefully for DC home Thursday or Friday depending on progress.   Ardeen Jourdain, PA-C Orthopaedic Surgery 04/10/2016, 7:46 AM

## 2016-04-10 NOTE — Evaluation (Signed)
Physical Therapy Evaluation Patient Details Name: Stacey Jefferson MRN: [id number] DOB: 10-30-1924 Today's Date: 04/10/2016   History of Present Illness  LTKA  Clinical Impression  The patient required extensive assist to get from bed to recliner. The patient is very drowsy, demonstrated poor balance today. The daughter  Reports plans for patient to DC to Home. Has a WC and RW. Pt admitted with above diagnosis. Pt currently with functional limitations due to the deficits listed below (see PT Problem List).  Pt will benefit from skilled PT to increase their independence and safety with mobility to allow discharge to the venue listed below.       Follow Up Recommendations Home health PT;Supervision/Assistance - 24 hour    Equipment Recommendations  None recommended by PT    Recommendations for Other Services       Precautions / Restrictions Precautions Precautions: Fall;Knee Required Braces or Orthoses: Knee Immobilizer - Left Knee Immobilizer - Left: Discontinue once straight leg raise with < 10 degree lag Restrictions Weight Bearing Restrictions: No      Mobility  Bed Mobility Overal bed mobility: Needs Assistance;+2 for physical assistance;+ 2 for safety/equipment Bed Mobility: Supine to Sit     Supine to sit: Total assist;+2 for physical assistance;+2 for safety/equipment;HOB elevated     General bed mobility comments: patient required extensive assist to sxsit at the edge of the bed. listing to Right and backward.  Transfers Overall transfer level: Needs assistance Equipment used: Rolling walker (2 wheeled) Transfers: Sit to/from [facility] Sit to Stand: Total assist;+2 physical assistance;+2 safety/equipment Stand pivot transfers: Total assist;+2 physical assistance;+2 safety/equipment       General transfer comment: attempted x 3 to dtand at the bedside, patient immediately sat down  On 4th attempt, depends pulled up and patient  was  assisted with  total assist to pivot to recliner.  Ambulation/Gait             General Gait Details: unable  Stairs            Wheelchair Mobility    Modified Rankin (Stroke Patients Only)       Balance Overall balance assessment: Needs assistance Sitting-balance support: Feet supported;Bilateral upper extremity supported Sitting balance-Leahy Scale: Zero     Standing balance support: During functional activity;Bilateral upper extremity supported Standing balance-Leahy Scale: Zero                               Pertinent Vitals/Pain Pain Assessment: Faces Faces Pain Scale: Hurts little more Pain Location: L knee Pain Descriptors / Indicators: Tender Pain Intervention(s): Limited activity within patient's tolerance;Monitored during session;Premedicated before session;Repositioned    Home Living Family/patient expects to be discharged to:: Private residence Living Arrangements: Children;Non-relatives/Friends Available Help at Discharge: Personal care attendant;Family Type of Home: House Home Access: Ramped entrance     Home Layout: One level Home Equipment: Shower seat;Bedside commode      Prior Function Level of Independence: Needs assistance   Gait / Transfers Assistance Needed: walks w/ rollator,   ADL's / Homemaking Assistance Needed: gets assist from family, stays byself a few hours at a time  Comments: L eye ptosis     Hand Dominance        Extremity/Trunk Assessment   Upper Extremity Assessment: Defer to OT evaluation           Lower Extremity Assessment: LLE deficits/detail;RLE deficits/detail RLE Deficits / Details: lacks 20 degrees of  knee extension LLE Deficits / Details: requires assist to lift leg  Cervical / Trunk Assessment: Kyphotic  Communication   Communication: HOH  Cognition Arousal/Alertness: Lethargic Behavior During Therapy: Flat affect Overall Cognitive Status: Impaired/Different from baseline Area of Impairment:  Following commands;Awareness       Following Commands: Follows one step commands inconsistently       General Comments: very lethargic and poor ly aroused to participate    General Comments      Exercises        Assessment/Plan    PT Assessment Patient needs continued PT services  PT Diagnosis Difficulty walking;Generalized weakness;Acute pain;Altered mental status   PT Problem List Decreased strength;Decreased range of motion;Decreased activity tolerance;Decreased mobility;Decreased cognition;Decreased balance;Decreased knowledge of use of DME;Decreased safety awareness;Decreased knowledge of precautions;Pain  PT Treatment Interventions DME instruction;Gait training;Functional mobility training;Therapeutic activities;Therapeutic exercise;Patient/family education;Cognitive remediation   PT Goals (Current goals can be found in the Care Plan section) Acute Rehab PT Goals Patient Stated Goal: to go home PT Goal Formulation: With patient/family Time For Goal Achievement: 04/17/16 Potential to Achieve Goals: Good    Frequency 7X/week   Barriers to discharge Decreased caregiver support      Co-evaluation               End of Session Equipment Utilized During Treatment: Gait belt Activity Tolerance: Patient limited by lethargy Patient left: in chair;with call bell/phone within reach;with family/visitor present Nurse Communication: Mobility status         Time: [id number] PT Time Calculation (min) (ACUTE ONLY): 19 min   Charges:   PT Evaluation $PT Eval Moderate Complexity: 1 Procedure     PT G CodesMarcelino Freestone PT [id number]  04/10/2016, 12:25 PM

## 2016-04-10 NOTE — Evaluation (Signed)
Occupational Therapy Evaluation Patient Details Name: Stacey Jefferson MRN: [id number] DOB: 02/08/1925 Today's Date: 04/10/2016    History of Present Illness LTKA   Clinical Impression   Pt admitted with above. She demonstrates the below listed deficits and will benefit from continued OT to maximize safety and independence with BADLs.  Pt presents to OT with generalized weakness, impaired cognition, and pain.  She requires total A for ADLs.  Daughter plans to take pt home, but unsure they will be able to do so safely - optimal plan would be SNF based on current level of functioning.  If pt discharges home, recommend HHOT, PT, and aid.       Follow Up Recommendations  Home health OT;Supervision/Assistance - 24 hour    Equipment Recommendations  None recommended by OT    Recommendations for Other Services       Precautions / Restrictions Precautions Precautions: Fall;Knee Required Braces or Orthoses: Knee Immobilizer - Left Knee Immobilizer - Left: Discontinue once straight leg raise with < 10 degree lag      Mobility Bed Mobility Overal bed mobility: Needs Assistance;+2 for physical assistance;+ 2 for safety/equipment Bed Mobility: Supine to Sit     Supine to sit: Total assist;+2 for physical assistance;+2 for safety/equipment;HOB elevated     General bed mobility comments: pt sitting up in chair   Transfers Overall transfer level: Needs assistance Equipment used: Rolling walker (2 wheeled) Transfers: Sit to/from [facility] Sit to Stand: Total assist;+2 physical assistance;+2 safety/equipment Stand pivot transfers: Total assist;+2 physical assistance;+2 safety/equipment       General transfer comment: Pt would not attempt with OT states she is tired     Balance Overall balance assessment: Needs assistance Sitting-balance support: Feet supported Sitting balance-Leahy Scale: Poor Sitting balance - Comments: max A to move to EOC    Standing balance  support: During functional activity;Bilateral upper extremity supported Standing balance-Leahy Scale: Zero                              ADL Overall ADL's : Needs assistance/impaired Eating/Feeding: Set up;Sitting   Grooming: Wash/dry hands;Wash/dry face;Moderate assistance;Sitting Grooming Details (indicate cue type and reason): requires significant amount of encouragement to participate  Upper Body Bathing: Maximal assistance;Sitting   Lower Body Bathing: Total assistance   Upper Body Dressing : Total assistance;Sitting   Lower Body Dressing: Total assistance   Toilet Transfer: Total assistance Toilet Transfer Details (indicate cue type and reason): Pt would not attempt  Toileting- Clothing Manipulation and Hygiene: Total assistance;Bed level         General ADL Comments: Pt very lethargic.  Only follows intermittent commands      Vision     Perception     Praxis      Pertinent Vitals/Pain Pain Assessment: Faces Faces Pain Scale: Hurts little more Pain Location: Lt knee Pain Descriptors / Indicators: Grimacing Pain Intervention(s): Limited activity within patient's tolerance     Hand Dominance Right   Extremity/Trunk Assessment Upper Extremity Assessment Upper Extremity Assessment: RUE deficits/detail;LUE deficits/detail RUE Deficits / Details: AAROM WFL.  Pt will not attempt AROM when prompted.  Daughter reports pt with h/o rotator cuff injuries, but is unsure which UE is worse LUE Deficits / Details: AAROM WFL, but pt reports pain with shoulder ROM.   She will not attempt AROM when prompted.  Daugther reports h/o rotator cuff injury    Lower Extremity Assessment Lower Extremity Assessment: Defer  to PT evaluation RLE Deficits / Details: lacks 20 degrees of knee extension LLE Deficits / Details: requires assist to lift leg   Cervical / Trunk Assessment Cervical / Trunk Assessment: Kyphotic   Communication Communication Communication: HOH    Cognition Arousal/Alertness: Lethargic Behavior During Therapy: Flat affect Overall Cognitive Status: Impaired/Different from baseline Area of Impairment: Following commands;Awareness       Following Commands: Follows one step commands inconsistently;Follows one step commands with increased time       General Comments: Pt very lethargic, difficulty maintaining arousal    General Comments       Exercises       Shoulder Instructions      Home Living Family/patient expects to be discharged to:: Private residence Living Arrangements: Children;Non-relatives/Friends Available Help at Discharge: Personal care attendant;Family Type of Home: House Home Access: Ramped entrance     Home Layout: One level     Bathroom Shower/Tub: Occupational psychologist: Handicapped height     Home Equipment: Shower seat;Bedside commode   Additional Comments: Daughter reports they have hired assist, and pt has 20-22 hour of assist/day       Prior Functioning/Environment Level of Independence: Needs assistance  Gait / Transfers Assistance Needed: walks w/ rollator,  ADL's / Homemaking Assistance Needed: Pt requires min A, overall for bathing and dressing.  Daughter reports pt has become fearful of showering and now sponge bathes    Comments: Lt eye ptosis.     OT Diagnosis: Generalized weakness;Cognitive deficits;Acute pain   OT Problem List: Decreased strength;Decreased activity tolerance;Impaired balance (sitting and/or standing);Decreased cognition;Decreased safety awareness;Decreased knowledge of use of DME or AE;Decreased knowledge of precautions;Pain   OT Treatment/Interventions: Self-care/ADL training;DME and/or AE instruction;Therapeutic activities;Cognitive remediation/compensation;Patient/family education;Balance training    OT Goals(Current goals can be found in the care plan section) Acute Rehab OT Goals Patient Stated Goal: to go home OT Goal Formulation: With  patient/family Time For Goal Achievement: 04/24/16 Potential to Achieve Goals: Good ADL Goals Pt Will Perform Upper Body Bathing: with min assist;sitting Pt Will Transfer to Toilet: with mod assist;stand pivot transfer;bedside commode Pt Will Perform Toileting - Clothing Manipulation and hygiene: with mod assist;sit to/from stand  OT Frequency: Min 2X/week   Barriers to D/C:            Co-evaluation              End of Session Equipment Utilized During Treatment: Left knee immobilizer Nurse Communication: Mobility status  Activity Tolerance: Patient limited by lethargy Patient left: in chair;with call bell/phone within reach;with family/visitor present   Time: [id number] OT Time Calculation (min): 11 min Charges:  OT General Charges $OT Visit: 1 Procedure OT Evaluation $OT Eval Moderate Complexity: 1 Procedure G-Codes:    Lucille Passy M 2016/04/29, 2:53 PM

## 2016-04-10 NOTE — Progress Notes (Signed)
Subjective: Post-Op doing well.   Objective: Vital signs in last 24 hours: Temp:  [97.6 F (36.4 C)-98.8 F (37.1 C)] 98.2 F (36.8 C) (06/14 0500) Pulse Rate:  [55-81] 68 (06/14 0500) Resp:  [13-18] 16 (06/14 0500) BP: (109-158)/(39-72) 120/46 mmHg (06/14 0500) SpO2:  [96 %-100 %] 100 % (06/14 0500) Weight:  [63.504 kg (140 lb)] 63.504 kg (140 lb) (06/13 1018)  Intake/Output from previous day: 06/13 0701 - 06/14 0700 In: 3100 [P.O.:240; I.V.:2750; IV Piggyback:110] Out: 2200 [Urine:2000; Drains:135; Blood:50] Intake/Output this shift:     Recent Labs  04/10/16 0400  HGB 9.3*    Recent Labs  04/10/16 0400  WBC 5.6  RBC 3.04*  HCT 28.1*  PLT 101*    Recent Labs  04/10/16 0400  NA 140  K 3.8  CL 108  CO2 27  BUN 12  CREATININE 0.83  GLUCOSE 141*  CALCIUM 8.1*   No results for input(s): LABPT, INR in the last 72 hours.  Dorsiflexion/Plantar flexion intact No cellulitis present Compartment soft  Assessment/Plan: Up with PT   Stacey Jefferson A 04/10/2016, 7:13 AM

## 2016-04-10 NOTE — Progress Notes (Signed)
Physical Therapy Treatment Patient Details Name: Stacey Jefferson MRN: [id number] DOB: 07/18/1925 Today's Date: 04/10/2016    History of Present Illness LTKA    PT Comments    Noted HR 89-127 during  ]session.  RN aware. Patient continues to require extensive assist for all aspects of mobility. Daughter present and aware of needs.  Follow Up Recommendations  Home health PT;Supervision/Assistance - 24 hour     Equipment Recommendations  None recommended by PT    Recommendations for Other Services       Precautions / Restrictions Precautions Precautions: Fall;Knee Required Braces or Orthoses: Knee Immobilizer - Left Knee Immobilizer - Left: Discontinue once straight leg raise with < 10 degree lag    Mobility  Bed Mobility Overal bed mobility: Needs Assistance Bed Mobility: Sit to Supine       Sit to supine: Total assist   General bed mobility comments: requires assist for all aspects   Transfers Overall transfer level: Needs assistance Equipment used: Rolling walker (2 wheeled) Transfers: Sit to/from [employer] Transfers Sit to Stand: Max assist;+2 physical assistance Stand pivot transfers: +2 physical assistance;Max assist       General transfer comment: Pt requires assist for all aspects.  She fatigues quickly requiring assist to maintain standing while performing peri care , transferred from recliner to [facility], incontinencenoted. stood x 3 for pericare, bed brought up to patient for return to bed.  Ambulation/Gait                 Stairs            Wheelchair Mobility    Modified Rankin (Stroke Patients Only)       Balance Overall balance assessment: Needs assistance Sitting-balance support: Feet supported Sitting balance-Leahy Scale: Poor Sitting balance - Comments: mod - max A to move to EOC   Standing balance support: Bilateral upper extremity supported;During functional activity Standing balance-Leahy Scale: Poor Standing balance  comment: Pt requires max A =2 to maintain standing balance                    Cognition Arousal/Alertness: Awake/alert;Lethargic Behavior During Therapy: Flat affect Overall Cognitive Status: Impaired/Different from baseline Area of Impairment: Attention;Safety/judgement;Problem solving   Current Attention Level: Focused;Sustained   Following Commands: Follows one step commands inconsistently Safety/Judgement: Decreased awareness of safety          Exercises      General Comments        Pertinent Vitals/Pain Pain Assessment: Faces Faces Pain Scale: Hurts little more Pain Location: Lt knee Pain Descriptors / Indicators: Discomfort;Grimacing;Guarding Pain Intervention(s): Limited activity within patient's tolerance    Home Living                      Prior Function            PT Goals (current goals can now be found in the care plan section) Progress towards PT goals: Not progressing toward goals - comment (requires extensive assistance)    Frequency  7X/week    PT Plan Current plan remains appropriate    Co-evaluation PT/OT/SLP Co-Evaluation/Treatment: Yes Reason for Co-Treatment: For patient/therapist safety PT goals addressed during session: Mobility/safety with mobility OT goals addressed during session: ADL's and self-care     End of Session   Activity Tolerance: Patient limited by lethargy Patient left: in bed;with call bell/phone within reach;with bed alarm set;with family/visitor present     Time: [id number] PT Time Calculation (min) (ACUTE  ONLY): 26 min  Charges:  $Therapeutic Activity: 8-22 mins                    G Codes:      [name] 04/10/2016, 5:06 PM

## 2016-04-11 ENCOUNTER — Inpatient Hospital Stay (HOSPITAL_COMMUNITY): Payer: Medicare Other

## 2016-04-11 ENCOUNTER — Encounter (HOSPITAL_COMMUNITY): Payer: Self-pay | Admitting: Cardiology

## 2016-04-11 DIAGNOSIS — I4891 Unspecified atrial fibrillation: Secondary | ICD-10-CM

## 2016-04-11 DIAGNOSIS — Z96652 Presence of left artificial knee joint: Secondary | ICD-10-CM

## 2016-04-11 DIAGNOSIS — I481 Persistent atrial fibrillation: Secondary | ICD-10-CM

## 2016-04-11 LAB — ECHOCARDIOGRAM COMPLETE
FS: 8 % — AB (ref 28–44)
Height: 61 in
IV/PV OW: 1.15
LA diam end sys: 35 mm
LA vol index: 54 mL/m2
LA vol: 90.2 mL
LADIAMINDEX: 2.1 cm/m2
LASIZE: 35 mm
LAVOLA4C: 86.3 mL
LDCA: 2.27 cm2
LVOT diameter: 17 mm
MVPKEVEL: 56.8 m/s
PW: 13.1 mm — AB (ref 0.6–1.1)
RV TAPSE: 17.3 mm
Reg peak vel: 269 cm/s
TR max vel: 269 cm/s
WEIGHTICAEL: 2240 [oz_av]

## 2016-04-11 LAB — CBC
HEMATOCRIT: 30.9 % — AB (ref 36.0–46.0)
HEMOGLOBIN: 10.4 g/dL — AB (ref 12.0–15.0)
MCH: 31.8 pg (ref 26.0–34.0)
MCHC: 33.7 g/dL (ref 30.0–36.0)
MCV: 94.5 fL (ref 78.0–100.0)
Platelets: 98 10*3/uL — ABNORMAL LOW (ref 150–400)
RBC: 3.27 MIL/uL — ABNORMAL LOW (ref 3.87–5.11)
RDW: 13.1 % (ref 11.5–15.5)
WBC: 6.7 10*3/uL (ref 4.0–10.5)

## 2016-04-11 LAB — BASIC METABOLIC PANEL
Anion gap: 5 (ref 5–15)
BUN: 9 mg/dL (ref 6–20)
CHLORIDE: 107 mmol/L (ref 101–111)
CO2: 27 mmol/L (ref 22–32)
CREATININE: 0.7 mg/dL (ref 0.44–1.00)
Calcium: 8.3 mg/dL — ABNORMAL LOW (ref 8.9–10.3)
GFR calc Af Amer: >60 mL/min (ref >60)
GFR calc non Af Amer: >60 mL/min (ref >60)
Glucose, Bld: 137 mg/dL — ABNORMAL HIGH (ref 65–99)
Potassium: 3.5 mmol/L (ref 3.5–5.1)
Sodium: 139 mmol/L (ref 135–145)

## 2016-04-11 LAB — TROPONIN I
Troponin I: <0.03 ng/mL (ref <0.031)
Troponin I: <0.03 ng/mL (ref <0.031)

## 2016-04-11 LAB — T4, FREE: Free T4: 1.01 ng/dL (ref 0.61–1.12)

## 2016-04-11 MED ORDER — DIGOXIN 0.25 MG/ML IJ SOLN
0.1250 mg | Freq: Four times a day (QID) | INTRAMUSCULAR | Status: AC
  Administered 2016-04-11 (×2): 0.125 mg via INTRAVENOUS
  Filled 2016-04-11 (×2): qty 0.5

## 2016-04-11 NOTE — Consult Note (Signed)
CARDIOLOGY CONSULT NOTE   Patient ID: Stacey Jefferson MRN: [id number], DOB/AGE: 80/12/1924   Admit date: 04/09/2016 Date of Consult: 04/11/2016   Primary Physician: Mathews Argyle, MD Primary Cardiologist: new  Pt. Profile  Stacey Jefferson is pleasant 45 year year-old Caucasian female with past medical history of GERD, RLS, esophageal stricture with perforation during esophageal dilation 09/25/2015, postop afib otherwise no other cardiac issues presented for L knee arthroplasty on 6/13 and found to be in afib with RVR after surgery  Problem List  Past Medical History  Diagnosis Date  . Dysphagia, unspecified(787.20)   . Memory loss   . Hypotension, unspecified   . Irritable bowel syndrome   . Restless legs syndrome (RLS)   . Carpal tunnel syndrome   . Abnormality of gait     uses rollator and walker at home for ambulation  . Polyneuropathy in other diseases classified elsewhere ([place])   . Hypertension   . GERD (gastroesophageal reflux disease)   . Arthritis   . Cancer ([place])     skin cancer left cheek with Mohrs  . HOH (hard of hearing)     bilateral hearing aids  . Eyelid abnormality     left eyelid droops(common)  . Hx of seasonal allergies   . Atrial fibrillation [facility])     Past Surgical History  Procedure Laterality Date  . Bladder aspiration    . Carpal tunnel release Right   . Squamous cell carcinoma excision    . Abdominal hysterectomy    . Eye surgery      bilateral cataract surgery with lens implants  . Mouth implants  1991  . Total knee arthroplasty Bilateral 05/10/2015    Procedure: TOTAL RIGHT  KNEE ARTHROPLASTY ;   LEFT KNEE STEROID INJECTION ;  Surgeon: Latanya Maudlin, MD;  Location: [facility] ORS;  Service: Orthopedics;  Laterality: Bilateral;  . Total knee arthroplasty Left 04/09/2016    Procedure: LEFT TOTAL KNEE ARTHROPLASTY;  Surgeon: Latanya Maudlin, MD;  Location: [facility] ORS;  Service: Orthopedics;  Laterality: Left;     Allergies  Allergies  Allergen  Reactions  . Clarithromycin Diarrhea  . Diltiazem Itching    "itching, rash"  . Sulfa Antibiotics Other (See Comments)    Reaction unknown    HPI   Stacey Jefferson is pleasant 65 year year-old Caucasian female with past medical history of GERD, RLS, esophageal stricture with perforation during esophageal dilation 09/25/2015, postop afib otherwise no other cardiac issues. During her hospitalization secondary to perforation of the esophagus as result of esophageal dilatation in November 2016, she did develop new onset of atrial fibrillation with RVR on 09/29/2015 at [facility] She was started on IV Cardizem drip and later transitioned to oral Cardizem. Heart rate was controlled. Her CHA2DS2-Vasc score was at least 3-4 (female, age +/-HTN). She was not started on systemic anticoagulation due to acute perforation. She was discharged on Cardizem, however she did developed rash which was felt to be related to Cardizem. She was transitioned to metoprolol, however she has severe fatigue on metoprolol. She was eventually transitioned back to bisoprolol which she has been taking for many years. Per daughter, it is unclear if she still have trouble swallowing, however she was told that no further EGD will be attempted. She eats only small amount at home. She does walk with a walker occasionally only for short distances. She is largely sedentary. She lives with her daughter. Her last episode of fall fortunately was over 3 years ago. Per internal  medicine note, since she was converted to normal sinus rhythm. Although under [place], in [employer] record, there is no EKG obtained after she went into atrial fibrillation.   She was admitted this time for left total knee arthroplasty was significant arthritis. Post procedure, she was noted to be atrial fibrillation with RVR. There is no EKG prior to the procedure. She has no cardiac awareness of any palpitation. She is currently on DVT dosing of Xarelto 10 mg daily.  Internal medicine service was consulted for interim consulted cardiology for atrial fibrillation. She has been given one dose of 0.25 IV digoxin and 2 doses of 0.125 mg IV digoxin yesterday and today. Her baseline heart rate seems to be around 80s, she frequently has breakthrough episodes with heart rate in the 120s.   Inpatient Medications  . bisoprolol  5 mg Oral QHS  . carbidopa-levodopa  1 tablet Oral TID  . chlorhexidine  15 mL Mouth/Throat Daily  . clonazePAM  0.5 mg Oral Daily  . donepezil  10 mg Oral QHS  . ferrous sulfate  325 mg Oral TID PC  . pantoprazole  40 mg Oral Daily  . polyvinyl alcohol   Both Eyes TID  . rivaroxaban  10 mg Oral Q breakfast  . vitamin B-12  100 mcg Oral Daily    Family History Family History  Problem Relation Age of Onset  . Dementia Mother   . Stroke Father      Social History Social History   Social History  . Marital Status: Widowed    Spouse Name: N/A  . Number of Children: 6  . Years of Education: 12   Occupational History  .      retired   Social History Main Topics  . Smoking status: Former Smoker    Types: Cigarettes  . Smokeless tobacco: Never Used     Comment: Quit 60 years ago.  . Alcohol Use: Yes     Comment: Rare glass of wine  . Drug Use: No  . Sexual Activity: Not on file   Other Topics Concern  . Not on file   Social History Narrative   Patient is retired widowed and has six children. High school education.  Right handed.   Caffeine- two cups daily.  Retired Agricultural engineer.              Review of Systems  General:  No chills, fever, night sweats or weight changes.  Cardiovascular:  No chest pain, dyspnea on exertion, edema, orthopnea, palpitations, paroxysmal nocturnal dyspnea. No cardiac awareness of afib Dermatological: No rash, lesions/masses Respiratory: No cough, dyspnea Urologic: No hematuria, dysuria Abdominal:   No nausea, vomiting, diarrhea, bright red blood per rectum, melena, or  hematemesis Neurologic:  No visual changes, wkns, changes in mental status. Denies significant pain in L knee All other systems reviewed and are otherwise negative except as noted above.  Physical Exam  Blood pressure 121/49, pulse 57, temperature 98.8 F (37.1 C), temperature source Oral, resp. rate 18, height 5\' 1"  (1.549 m), weight 140 lb (63.504 kg), SpO2 100 %.  General: Pleasant, NAD Psych: Normal affect. Neuro: Alert. Mild dementia and hard of hearing, but largely oriented. Moves all extremities spontaneously. HEENT: Normal  Neck: Supple without bruits or JVD. Lungs:  Resp regular and unlabored, anterior exam CTA. Heart: irregular. no s3, s4, or murmurs. Abdomen: Soft, non-tender, non-distended, BS + x 4.  Extremities: No clubbing, cyanosis or edema. DP/PT/Radials 2+ and equal bilaterally. Dressing noted over R knee  Labs   Recent Labs  04/10/16 2126 04/11/16 0203 04/11/16 0933  TROPONINI <0.03 <0.03 <0.03   Lab Results  Component Value Date   WBC 6.7 04/11/2016   HGB 10.4* 04/11/2016   HCT 30.9* 04/11/2016   MCV 94.5 04/11/2016   PLT 98* 04/11/2016     Recent Labs Lab 04/11/16 0203  NA 139  K 3.5  CL 107  CO2 27  BUN 9  CREATININE 0.70  CALCIUM 8.3*  GLUCOSE 137*   No results found for: CHOL, HDL, LDLCALC, TRIG No results found for: DDIMER  Radiology/Studies  No results found.  ECG  Afib with RVR rate 132, non specific ST T wave changes.  04/11/2016    ASSESSMENT AND PLAN  1. Recurrent PAF  - ideally she should be on full dose Xarelto or eliquis given CHA2DS2-Vasc score of 3-4   - This patients CHA2DS2-VASc Score and unadjusted Ischemic Stroke Rate (% per year) is equal to 4.8 % stroke rate/year from a score of 59 (female, age +/- HTN) Although our record does not indicate she has HTN, [facility] record shows she has PMH of HTN  Above score calculated as 1 point each if present [CHF, HTN, DM, Vascular=MI/PAD/Aortic Plaque, Age if 43-74, or  Female] Above score calculated as 2 points each if present [Age > 75, or Stroke/TIA/TE]  - she is currently on 10mg  daily Xarelto, which is DVT dosing. She appears to be very frail, but normally would expect high risk of fall, but per daughter she is largely sedantary and has not had a fall in 3 years.   - no TEE in this patient, given previous esophageal rupture during EGD. Focus on rate control, her current HR is controlled after 3 dose of IV digoxin, her BP is borderline. Consider add 0.125mg  daily digoxin for rate control. If significant breakthrough episodes, we can consider amiodarone  2. H/o esophageal stricture s/p esophageal dilatation in 08/2015 with resultant perforation  3. L knee OA s/p L total knee arthroplasty: per ortho   Signed, Minus Breeding, PA-C 04/11/2016, 11:18 AM  History and all data above reviewed.  Patient examined.  I agree with the findings as above.  The patient is quite somnolent but able to answer questions.  She does not notice that she is in fibrillation with rapid rate.  I was able to get a clear history from her daughter.  The patient is careful not to fall.  She has had no bleeding issues.   The patient exam reveals [id number]  ,  Lungs: clear  ,  Abd: Positive bowel sounds, no rebound no guarding, Ext No edema, post op right knee  .  All available labs, radiology testing, previous records reviewed. Agree with documented assessment and plan. Atrial fib:  Discussed at length with her daughter.  I agree with plans as above for rate control.  This does not need to be perfect prior to discharge.  We can follow this as an outpatient.  She should be on full dose anticoagulation prior to discharge.  I would suggest Eliquis 2.5 mg bid when OK from a surgical standpoint.    Jeneen Rinks [facility]  11:36 AM  04/11/2016

## 2016-04-11 NOTE — Progress Notes (Signed)
OT Cancellation Note  Patient Details Name: Stacey Jefferson MRN: [id number] DOB: 1924-11-05   Cancelled Treatment:    Reason Eval/Treat Not Completed: Patient at procedure or test/ unavailable  Will recheck later in day or next day  Kari Baars, [place]  [facility], Edwena Felty D 04/11/2016, 2:30 PM

## 2016-04-11 NOTE — Progress Notes (Signed)
  Echocardiogram 2D Echocardiogram has been performed.  Jennette Dubin 04/11/2016, 1:46 PM

## 2016-04-11 NOTE — Progress Notes (Addendum)
Physical Therapy Treatment Patient Details Name: Stacey Jefferson MRN: [id number] DOB: October 26, 1925 Today's Date: 04/11/2016    History of Present Illness LTKA    PT Comments    POD # 2  Assisted OOB to [facility] + 2 assist then I was unable to get her off.  Pt needed + 3 assist to stand and perform peri care.  Pt was unable to support self in standing and unable to weight shift enough to take any steps.  BSC and recliner switched out behind pt and assisted to recliner.  Hoyer pad placed for nursing to use lift to assist pt back to bed.    Follow Up Recommendations  Home health PT  Consulted with LPT pt's slow progress (Rec SNF due to poor performance level,  pt is a Medicare Bundle planning home D/C)     Equipment Recommendations       Recommendations for Other Services       Precautions / Restrictions Precautions Precautions: Fall;Knee Restrictions Weight Bearing Restrictions: No    Mobility  Bed Mobility Overal bed mobility: Needs Assistance Bed Mobility: Supine to Sit     Supine to sit: Total assist;+2 for physical assistance;+2 for safety/equipment;HOB elevated (pt 10%)     General bed mobility comments: very difficult to perfrom and rigid  Transfers Overall transfer level: Needs assistance   Transfers: Sit to/from Stand;Stand Pivot Transfers Sit to Stand: Total assist;+2 physical assistance (pt 10%) Stand pivot transfers: Total assist;+2 physical assistance (pt 5%)       General transfer comment: attempted sit to stand with walker however pt was unable to put forth enough effort to rise and clear hips off bed.  Used stand pivot sit "Bear Hug" to assist pt from elevated bed to [facility] pt 0%.  Severe posterior lean and inability to support self in standing.  was unable to get pt off BSC.  Called RN and NT to assist.  Lower level height required + 3 assist to stand pt and perform peri care.  Very poor standing ability.  had to swith out [facility] and recliner from behind as pt was  unable to take any functional steps.    Ambulation/Gait             General Gait Details: unable   Stairs            Wheelchair Mobility    Modified Rankin (Stroke Patients Only)       Balance                                    Cognition Arousal/Alertness: Awake/alert;Lethargic   Overall Cognitive Status: Impaired/Different from baseline Area of Impairment: Attention;Safety/judgement;Problem solving               General Comments: difficult to arouse from sleeping    Exercises      General Comments        Pertinent Vitals/Pain Pain Assessment: Faces Faces Pain Scale: Hurts a little bit Pain Location: L knee Pain Descriptors / Indicators: Grimacing;Discomfort;Guarding Pain Intervention(s): Monitored during session;Repositioned;Ice applied    Home Living                      Prior Function            PT Goals (current goals can now be found in the care plan section) Progress towards PT goals: Progressing toward goals  Frequency  7X/week    PT Plan Current plan remains appropriate    Co-evaluation             End of Session Equipment Utilized During Treatment: Gait belt   Patient left: in chair;with call bell/phone within reach;with family/visitor present     Time: [id number] PT Time Calculation (min) (ACUTE ONLY): 43 min  Charges:  $Therapeutic Activity: 23-37 mins                    G Codes:      Rica Koyanagi  PTA [facility]  Acute  Rehab Pager      [phone]

## 2016-04-11 NOTE — Progress Notes (Signed)
Subjective: 2 Days Post-Op Procedure(s) (LRB): LEFT TOTAL KNEE ARTHROPLASTY (Left) Patient reports pain as 2 on 0-10 scale. Doing well except for Atrial Fibrillation.She is being managed by the Cardiologist. Dressing dry.HBg10.4   Objective: Vital signs in last 24 hours: Temp:  [98.6 F (37 C)-101.5 F (38.6 C)] 98.8 F (37.1 C) (06/15 0427) Pulse Rate:  [57-146] 57 (06/15 0427) Resp:  [16-18] 18 (06/15 0427) BP: (104-125)/(41-83) 121/49 mmHg (06/15 0427) SpO2:  [96 %-100 %] 100 % (06/15 0427)  Intake/Output from previous day: 06/14 0701 - 06/15 0700 In: 700 [P.O.:360; I.V.:340] Out: 1 [Stool:1] Intake/Output this shift:     Recent Labs  04/10/16 0400 04/11/16 0203  HGB 9.3* 10.4*    Recent Labs  04/10/16 0400 04/11/16 0203  WBC 5.6 6.7  RBC 3.04* 3.27*  HCT 28.1* 30.9*  PLT 101* 98*    Recent Labs  04/10/16 0400 04/11/16 0203  NA 140 139  K 3.8 3.5  CL 108 107  CO2 27 27  BUN 12 9  CREATININE 0.83 0.70  GLUCOSE 141* 137*  CALCIUM 8.1* 8.3*   No results for input(s): LABPT, INR in the last 72 hours.  Dorsiflexion/Plantar flexion intact No cellulitis present  Assessment/Plan: 2 Days Post-Op Procedure(s) (LRB): LEFT TOTAL KNEE ARTHROPLASTY (Left) Up with therapy.Will wait for Hospitalist for disposition.  Stacey Jefferson A 04/11/2016, 7:12 AM

## 2016-04-11 NOTE — Progress Notes (Signed)
PROGRESS NOTE  Stacey Jefferson [id number] DOB: 1925/06/15 DOA: 04/09/2016 PCP: Mathews Argyle, MD  HPI/Recap of past 24 hours: 82F who underwent L knee arthroplasty 6/13 and was found to be in Afib RVR for which hospitalist was consulted. She was given several doses of IV digoxin after which she has been in rate controlled Afib this AM. She is without any complaints of chest pain, dizziness or palpitations. She was placed on oral Diltiazem in the past, due to which she had a rash.   Assessment/Plan: Active Problems:   Status post total left knee replacement - per primary ortho team   Pressure ulcer  Afib RVR - now rates are controlled, electrolytes normal. TSH normal, Echo pending this AM. She likely would meet criteria for full anticoagulation. Given continued Afib and prior reaction to PO diltiazem, will consult cardiology today for recommendations  Code Status: FULL   Family Communication: DW patient and daughter in the room this AM   Disposition Plan: Per ortho, plan is for HHPT. Once cardiology workup is complete there is no medical barrier to discharge from the hospital.    Objective: Filed Vitals:   04/10/16 1623 04/10/16 1946 04/10/16 2345 04/11/16 0427  BP:  105/52 104/53 121/49  Pulse: 126 110 105 57  Temp:  101.5 F (38.6 C)  98.8 F (37.1 C)  TempSrc:  Oral  Oral  Resp:  16 18 18   Height:      Weight:      SpO2:  99% 100% 100%    Intake/Output Summary (Last 24 hours) at 04/11/16 1113 Last data filed at 04/10/16 1948  Gross per 24 hour  Intake    160 ml  Output      1 ml  Net    159 ml   Filed Weights   04/09/16 1018  Weight: 63.504 kg (140 lb)    Exam: General:  Alert, oriented, calm, in no acute distress Eyes: pupils round and reactive to light and accomodation, clear sclerea Neck: supple, no masses, trachea mildline  Cardiovascular: irregular, no murmurs or rubs, no peripheral edema  Respiratory: clear to auscultation bilaterally, no  wheezes, no crackles  Abdomen: soft, nontender, nondistended, normal bowel tones heard  Skin: dry, no rashes  Musculoskeletal: no joint effusions, left knee bandage Psychiatric: appropriate affect, normal speech  Neurologic: extraocular muscles intact, clear speech, moving all extremities with intact sensorium    Data Reviewed: CBC:  Recent Labs Lab 04/10/16 0400 04/11/16 0203  WBC 5.6 6.7  HGB 9.3* 10.4*  HCT 28.1* 30.9*  MCV 92.4 94.5  PLT 101* 98*   Basic Metabolic Panel:  Recent Labs Lab 04/10/16 0400 04/11/16 0203  NA 140 139  K 3.8 3.5  CL 108 107  CO2 27 27  GLUCOSE 141* 137*  BUN 12 9  CREATININE 0.83 0.70  CALCIUM 8.1* 8.3*   GFR: Estimated Creatinine Clearance: 41.5 mL/min (by C-G formula based on Cr of 0.7). Liver Function Tests: No results for input(s): AST, ALT, ALKPHOS, BILITOT, PROT, ALBUMIN in the last 168 hours. No results for input(s): LIPASE, AMYLASE in the last 168 hours. No results for input(s): AMMONIA in the last 168 hours. Coagulation Profile: No results for input(s): INR, PROTIME in the last 168 hours. Cardiac Enzymes:  Recent Labs Lab 04/10/16 2126 04/11/16 0203 04/11/16 0933  TROPONINI <0.03 <0.03 <0.03   BNP (last 3 results) No results for input(s): PROBNP in the last 8760 hours. HbA1C: No results for input(s): HGBA1C in the last  72 hours. CBG: No results for input(s): GLUCAP in the last 168 hours. Lipid Profile: No results for input(s): CHOL, HDL, LDLCALC, TRIG, CHOLHDL, LDLDIRECT in the last 72 hours. Thyroid Function Tests:  Recent Labs  04/10/16 2126  TSH 0.899  FREET4 1.01   Anemia Panel: No results for input(s): VITAMINB12, FOLATE, FERRITIN, TIBC, IRON, RETICCTPCT in the last 72 hours. Urine analysis:    Component Value Date/Time   COLORURINE YELLOW 09/16/2013 [place] 09/16/2013 1539   LABSPEC 1.023 09/16/2013 1539   PHURINE 6.5 09/16/2013 1539   GLUCOSEU NEGATIVE 09/16/2013 1539   HGBUR  NEGATIVE 09/16/2013 1539   BILIRUBINUR NEGATIVE 09/16/2013 1539   KETONESUR NEGATIVE 09/16/2013 1539   PROTEINUR NEGATIVE 09/16/2013 1539   UROBILINOGEN 0.2 09/16/2013 1539   NITRITE NEGATIVE 09/16/2013 1539   LEUKOCYTESUR NEGATIVE 09/16/2013 1539   Sepsis Labs: @LABRCNTIP (procalcitonin:4,lacticidven:4)  ) Recent Results (from the past 240 hour(s))  Surgical pcr screen     Status: None   Collection Time: 04/02/16  2:28 PM  Result Value Ref Range Status   MRSA, PCR NEGATIVE NEGATIVE Final   Staphylococcus aureus NEGATIVE NEGATIVE Final    Comment:        The Xpert SA Assay (FDA approved for NASAL specimens in patients over 63 years of age), is one component of a comprehensive surveillance program.  Test performance has been validated by [facility] for patients greater than or equal to 35 year old. It is not intended to diagnose infection nor to guide or monitor treatment.       Studies: No results found.  Scheduled Meds: . bisoprolol  5 mg Oral QHS  . carbidopa-levodopa  1 tablet Oral TID  . chlorhexidine  15 mL Mouth/Throat Daily  . clonazePAM  0.5 mg Oral Daily  . donepezil  10 mg Oral QHS  . ferrous sulfate  325 mg Oral TID PC  . pantoprazole  40 mg Oral Daily  . polyvinyl alcohol   Both Eyes TID  . rivaroxaban  10 mg Oral Q breakfast  . vitamin B-12  100 mcg Oral Daily    Continuous Infusions:    LOS: 2 days   Time spent: 22 min  Mir Marry Guan, MD [facility] Pager [phone]  If 7PM-7AM, please contact night-coverage [email or web address] Password [email or web address] 04/11/2016, 11:13 AM

## 2016-04-12 DIAGNOSIS — I4891 Unspecified atrial fibrillation: Secondary | ICD-10-CM

## 2016-04-12 LAB — CBC
HEMATOCRIT: 29.2 % — AB (ref 36.0–46.0)
Hemoglobin: 10 g/dL — ABNORMAL LOW (ref 12.0–15.0)
MCH: 31.5 pg (ref 26.0–34.0)
MCHC: 34.2 g/dL (ref 30.0–36.0)
MCV: 92.1 fL (ref 78.0–100.0)
Platelets: 109 10*3/uL — ABNORMAL LOW (ref 150–400)
RBC: 3.17 MIL/uL — ABNORMAL LOW (ref 3.87–5.11)
RDW: 12.8 % (ref 11.5–15.5)
WBC: 7.2 10*3/uL (ref 4.0–10.5)

## 2016-04-12 MED ORDER — HYDROCODONE-ACETAMINOPHEN 7.5-325 MG PO TABS
1.0000 | ORAL_TABLET | ORAL | Status: DC | PRN
Start: 2016-04-12 — End: 2016-05-30

## 2016-04-12 MED ORDER — DIGOXIN 125 MCG PO TABS
0.0625 mg | ORAL_TABLET | Freq: Every day | ORAL | Status: DC
  Administered 2016-04-12 – 2016-04-13 (×2): 0.0625 mg via ORAL
  Filled 2016-04-12: qty 0.5

## 2016-04-12 MED ORDER — LOPERAMIDE HCL 1 MG/5ML PO LIQD
2.0000 mg | ORAL | Status: DC | PRN
  Administered 2016-04-12: 2 mg via ORAL
  Filled 2016-04-12 (×2): qty 10

## 2016-04-12 MED ORDER — HYDROCODONE-ACETAMINOPHEN 7.5-325 MG PO TABS: 1.0000 | ORAL_TABLET | ORAL | Status: DC | PRN

## 2016-04-12 MED ORDER — RIVAROXABAN 10 MG PO TABS: 10.0000 mg | ORAL_TABLET | Freq: Every day | ORAL | Status: DC

## 2016-04-12 NOTE — Progress Notes (Signed)
Occupational Therapy Treatment Patient Details Name: AVERLEE REDFERN MRN: [id number] DOB: 20-Dec-1924 Today's Date: 04/12/2016    History of present illness LTKA   OT comments  Pt improved this day but still 2 person A. Daughter Aware and observed entire treatment session  Follow Up Recommendations  Home health OT;Supervision/Assistance - 24 hour;Other (comment) (pt would benefit from SNF but daughter insisting on [facility] and aware pt is 2 person A)    Equipment Recommendations  None recommended by OT       Precautions / Restrictions Precautions Precautions: Fall;Knee Restrictions Weight Bearing Restrictions: No       Mobility Bed Mobility Overal bed mobility: Needs Assistance Bed Mobility: Supine to Sit     Supine to sit: Max assist;+2 for physical assistance;+2 for safety/equipment        Transfers Overall transfer level: Needs assistance Equipment used: Rolling walker (2 wheeled) Transfers: Sit to/from [facility] Sit to Stand: Mod assist;+2 physical assistance;+2 safety/equipment Stand pivot transfers: Mod assist;+2 physical assistance                ADL Overall ADL's : Needs assistance/impaired                     Lower Body Dressing: Sit to/from stand;+2 for physical assistance;Total assistance   Toilet Transfer: +2 for physical assistance;Stand-pivot;BSC;RW;Moderate assistance;Cueing for safety   Toileting- Clothing Manipulation and Hygiene: Total assistance;Sit to/from stand;+2 for physical assistance;Cueing for sequencing;Cueing for safety Toileting - Clothing Manipulation Details (indicate cue type and reason): mod A of 2 to stand.  total A fo hygiene and clothing management       General ADL Comments: spoke with daugther regarding amount of A pt will need at home. She states there will always be 2 people to A her with transfers. Discussed standing and switching out BSC and WC                 Cognition   Behavior During  Therapy: WFL for tasks assessed/performed Overall Cognitive Status: Impaired/Different from baseline                               General Comments  Pt not consistent in following commands and seems to have delay processing at times.  Pt needs increased VC and time for all transitions    Pertinent Vitals/ Pain       Pain Score: 4  Faces Pain Scale: Hurts little more Pain Location: L knee Pain Descriptors / Indicators: Grimacing;Discomfort Pain Intervention(s): Monitored during session;Ice applied;Repositioned         Frequency Min 2X/week     Progress Toward Goals  OT Goals(current goals can now be found in the care plan section)  Progress towards OT goals: Progressing toward goals     Plan Discharge plan needs to be updated    Co-evaluation    PT/OT/SLP Co-Evaluation/Treatment: Yes Reason for Co-Treatment: For patient/therapist safety PT goals addressed during session: Mobility/safety with mobility OT goals addressed during session: ADL's and self-care      End of Session     Activity Tolerance Patient tolerated treatment well   Patient Left in chair;with call bell/phone within reach;with family/visitor present   Nurse Communication Mobility status;Need for lift equipment        Time: 1203-1220 OT Time Calculation (min): 17 min  Charges: OT General Charges $OT Visit: 1 Procedure OT Treatments $Self Care/Home Management : 8-22 mins  Betsy Pries 04/12/2016, 12:29 PM

## 2016-04-12 NOTE — Progress Notes (Signed)
PROGRESS NOTE  Stacey Jefferson [id number] DOB: September 21, 1925 DOA: 04/09/2016 PCP: Mathews Argyle, MD  HPI/Recap of past 24 hours: 21F who underwent L knee arthroplasty 6/13 and was found to be in Afib RVR for which hospitalist was consulted. She was given several doses of IV digoxin after which she has been in rate controlled Afib, cardiology was consulted due to recurrent Afiba nd questions from family about anticoagulation.  Currently on home BB but not rate controlled this AM, asymptomatic. Discussed with patient and 3 of her 6 children this AM.  Assessment/Plan: Active Problems:   Status post total left knee replacement - per primary ortho team   Pressure ulcer  Afib RVR - uncontrolled, electrolytes normal. TSH normal, Echo unremarkable. Appreciate cardiology management. She will need something for better rate control, will defer to cardiology. Regarding A/C for Afib, she will d/c on Xarelto per Ortho, and transition to full anticoagulation for Afib once cleared by ortho.  Code Status: FULL   Family Communication: DW patient and daughters in the room this AM   Disposition Plan: Per ortho, plan is for HHPT. Once cardiology workup is complete there is no medical barrier to discharge from the hospital.    Objective: Filed Vitals:   04/11/16 0427 04/11/16 1542 04/11/16 2113 04/12/16 0651  BP: 121/49 102/68 101/46 109/41  Pulse: 57 70 81 127  Temp: 98.8 F (37.1 C) 98.1 F (36.7 C) 99 F (37.2 C) 99.2 F (37.3 C)  TempSrc: Oral Oral Oral Oral  Resp: 18 18 18 20   Height:      Weight:      SpO2: 100% 100% 100% 100%    Intake/Output Summary (Last 24 hours) at 04/12/16 1025 Last data filed at 04/11/16 1544  Gross per 24 hour  Intake    120 ml  Output      0 ml  Net    120 ml   Filed Weights   04/09/16 1018  Weight: 63.504 kg (140 lb)    Exam: General:  Alert, oriented, calm, in no acute distress Eyes: pupils round and reactive to light and accomodation, clear  sclerea Neck: supple, no masses, trachea mildline  Cardiovascular: irregular, no murmurs or rubs, no peripheral edema  Respiratory: clear to auscultation bilaterally, no wheezes, no crackles  Abdomen: soft, nontender, nondistended, normal bowel tones heard  Skin: dry, no rashes  Musculoskeletal: no joint effusions, left knee bandage Psychiatric: appropriate affect, normal speech  Neurologic: extraocular muscles intact, clear speech, moving all extremities with intact sensorium    Data Reviewed: CBC:  Recent Labs Lab 04/10/16 0400 04/11/16 0203 04/12/16 0435  WBC 5.6 6.7 7.2  HGB 9.3* 10.4* 10.0*  HCT 28.1* 30.9* 29.2*  MCV 92.4 94.5 92.1  PLT 101* 98* 0000000*   Basic Metabolic Panel:  Recent Labs Lab 04/10/16 0400 04/11/16 0203  NA 140 139  K 3.8 3.5  CL 108 107  CO2 27 27  GLUCOSE 141* 137*  BUN 12 9  CREATININE 0.83 0.70  CALCIUM 8.1* 8.3*   GFR: Estimated Creatinine Clearance: 41.5 mL/min (by C-G formula based on Cr of 0.7). Liver Function Tests: No results for input(s): AST, ALT, ALKPHOS, BILITOT, PROT, ALBUMIN in the last 168 hours. No results for input(s): LIPASE, AMYLASE in the last 168 hours. No results for input(s): AMMONIA in the last 168 hours. Coagulation Profile: No results for input(s): INR, PROTIME in the last 168 hours. Cardiac Enzymes:  Recent Labs Lab 04/10/16 2126 04/11/16 0203 04/11/16 0933  TROPONINI <  0.03 <0.03 <0.03   BNP (last 3 results) No results for input(s): PROBNP in the last 8760 hours. HbA1C: No results for input(s): HGBA1C in the last 72 hours. CBG: No results for input(s): GLUCAP in the last 168 hours. Lipid Profile: No results for input(s): CHOL, HDL, LDLCALC, TRIG, CHOLHDL, LDLDIRECT in the last 72 hours. Thyroid Function Tests:  Recent Labs  04/10/16 2126  TSH 0.899  FREET4 1.01   Anemia Panel: No results for input(s): VITAMINB12, FOLATE, FERRITIN, TIBC, IRON, RETICCTPCT in the last 72 hours. Urine analysis:     Component Value Date/Time   COLORURINE YELLOW 09/16/2013 [place] 09/16/2013 1539   LABSPEC 1.023 09/16/2013 1539   PHURINE 6.5 09/16/2013 1539   GLUCOSEU NEGATIVE 09/16/2013 1539   HGBUR NEGATIVE 09/16/2013 1539   BILIRUBINUR NEGATIVE 09/16/2013 1539   KETONESUR NEGATIVE 09/16/2013 1539   PROTEINUR NEGATIVE 09/16/2013 1539   UROBILINOGEN 0.2 09/16/2013 1539   NITRITE NEGATIVE 09/16/2013 1539   LEUKOCYTESUR NEGATIVE 09/16/2013 1539   Sepsis Labs: @LABRCNTIP (procalcitonin:4,lacticidven:4)  ) Recent Results (from the past 240 hour(s))  Surgical pcr screen     Status: None   Collection Time: 04/02/16  2:28 PM  Result Value Ref Range Status   MRSA, PCR NEGATIVE NEGATIVE Final   Staphylococcus aureus NEGATIVE NEGATIVE Final    Comment:        The Xpert SA Assay (FDA approved for NASAL specimens in patients over 83 years of age), is one component of a comprehensive surveillance program.  Test performance has been validated by [facility] for patients greater than or equal to 36 year old. It is not intended to diagnose infection nor to guide or monitor treatment.       Studies: No results found.  Scheduled Meds: . bisoprolol  5 mg Oral QHS  . carbidopa-levodopa  1 tablet Oral TID  . chlorhexidine  15 mL Mouth/Throat Daily  . clonazePAM  0.5 mg Oral Daily  . donepezil  10 mg Oral QHS  . ferrous sulfate  325 mg Oral TID PC  . pantoprazole  40 mg Oral Daily  . polyvinyl alcohol   Both Eyes TID  . rivaroxaban  10 mg Oral Q breakfast  . vitamin B-12  100 mcg Oral Daily    Continuous Infusions:    LOS: 3 days   Time spent: 26 min  Mir Marry Guan, MD [facility] Pager [phone]  If 7PM-7AM, please contact night-coverage [email or web address] Password [facility] 04/12/2016, 10:25 AM

## 2016-04-12 NOTE — Progress Notes (Signed)
I took over care for this patient at 1500.  I agree with the previous nurses assessment.

## 2016-04-12 NOTE — Progress Notes (Signed)
    SUBJECTIVE:  She is more awake.  No acute complaints.     PHYSICAL EXAM Filed Vitals:   04/11/16 0427 04/11/16 1542 04/11/16 2113 04/12/16 0651  BP: 121/49 102/68 101/46 109/41  Pulse: 57 70 81 127  Temp: 98.8 F (37.1 C) 98.1 F (36.7 C) 99 F (37.2 C) 99.2 F (37.3 C)  TempSrc: Oral Oral Oral Oral  Resp: 18 18 18 20   Height:      Weight:      SpO2: 100% 100% 100% 100%   General:  No distress Lungs:  Clear Heart:  Irregular Abdomen:  Positive bowel sounds, no rebound no guarding Extremities:  No edema   LABS: Lab Results  Component Value Date   TROPONINI <0.03 04/11/2016   Results for orders placed or performed during the hospital encounter of 04/09/16 (from the past 24 hour(s))  CBC     Status: Abnormal   Collection Time: 04/12/16  4:35 AM  Result Value Ref Range   WBC 7.2 4.0 - 10.5 K/uL   RBC 3.17 (L) 3.87 - 5.11 MIL/uL   Hemoglobin 10.0 (L) 12.0 - 15.0 g/dL   HCT 29.2 (L) 36.0 - 46.0 %   MCV 92.1 78.0 - 100.0 fL   MCH 31.5 26.0 - 34.0 pg   MCHC 34.2 30.0 - 36.0 g/dL   RDW 12.8 11.5 - 15.5 %   Platelets 109 (L) 150 - 400 K/uL    Intake/Output Summary (Last 24 hours) at 04/12/16 1019 Last data filed at 04/11/16 1544  Gross per 24 hour  Intake    120 ml  Output      0 ml  Net    120 ml    ASSESSMENT AND PLAN:  PAF:    Surgery is suggesting not to increase anticoagulation to full dose at this point.  When they allow this I would suggest reduced dose Eliquis.  I will add a low dose of digoxin for now for rate control.  Continue beta blocker.     Jeneen Rinks [facility] 04/12/2016 10:19 AM

## 2016-04-12 NOTE — Progress Notes (Signed)
   Subjective: 3 Days Post-Op Procedure(s) (LRB): LEFT TOTAL KNEE ARTHROPLASTY (Left) Patient reports pain as moderate.   Patient seen in rounds with Dr. Gladstone Lighter. Patient is well, but has had some minor complaints of fatigue and pain in the left knee, requiring pain medications . Reports that therapy has been difficult, requiring multiple assist.  Objective: Vital signs in last 24 hours: Temp:  [98.1 F (36.7 C)-99.2 F (37.3 C)] 99.2 F (37.3 C) (06/16 0651) Pulse Rate:  [70-127] 127 (06/16 0651) Resp:  [18-20] 20 (06/16 0651) BP: (101-109)/(41-68) 109/41 mmHg (06/16 0651) SpO2:  [100 %] 100 % (06/16 0651)  Intake/Output from previous day:  Intake/Output Summary (Last 24 hours) at 04/12/16 0834 Last data filed at 04/11/16 1544  Gross per 24 hour  Intake    120 ml  Output      0 ml  Net    120 ml     Labs:  Recent Labs  04/10/16 0400 04/11/16 0203 04/12/16 0435  HGB 9.3* 10.4* 10.0*    Recent Labs  04/11/16 0203 04/12/16 0435  WBC 6.7 7.2  RBC 3.27* 3.17*  HCT 30.9* 29.2*  PLT 98* 109*    Recent Labs  04/10/16 0400 04/11/16 0203  NA 140 139  K 3.8 3.5  CL 108 107  CO2 27 27  BUN 12 9  CREATININE 0.83 0.70  GLUCOSE 141* 137*  CALCIUM 8.1* 8.3*    EXAM General - Patient is Alert and Oriented Extremity - Neurologically intact Intact pulses distally Dorsiflexion/Plantar flexion intact No cellulitis present Compartment soft Dressing/Incision - clean, dry, no drainage Motor Function - intact, moving foot and toes well on exam.   Past Medical History  Diagnosis Date  . Dysphagia, unspecified(787.20)   . Memory loss   . Hypotension, unspecified   . Irritable bowel syndrome   . Restless legs syndrome (RLS)   . Carpal tunnel syndrome   . Abnormality of gait     uses rollator and walker at home for ambulation  . Polyneuropathy in other diseases classified elsewhere ([place])   . Hypertension   . GERD (gastroesophageal reflux disease)   . Arthritis    . Cancer ([place])     skin cancer left cheek with Mohrs  . HOH (hard of hearing)     bilateral hearing aids  . Eyelid abnormality     left eyelid droops(common)  . Hx of seasonal allergies   . Atrial fibrillation (HCC)     Assessment/Plan: 3 Days Post-Op Procedure(s) (LRB): LEFT TOTAL KNEE ARTHROPLASTY (Left) Active Problems:   Status post total left knee replacement   Pressure ulcer  Estimated body mass index is 26.47 kg/(m^2) as calculated from the following:   Height as of this encounter: 5\' 1"  (1.549 m).   Weight as of this encounter: 63.504 kg (140 lb). Advance diet Up with therapy Plan for discharge tomorrow  DVT Prophylaxis - Xarelto Weight-Bearing as tolerated   Will have her walk with therapy today. If she progresses well today, hopeful for DC home with HHPT and her daughters. Will send home on xarelto 10mg . Prefer to not start on full dose 20mg  per afib protocol yet to minimize bleeding into the knee. Will not give IV pain meds due to risk of sedation and confusion. Will not increase pain meds over Norco 7.5.  Ardeen Jourdain, PA-C Orthopaedic Surgery 04/12/2016, 8:34 AM

## 2016-04-12 NOTE — Progress Notes (Signed)
Physical Therapy Treatment Patient Details Name: Stacey Jefferson MRN: [id number] DOB: [birth year] Today's Date: 04/12/2016    History of Present Illness LTKA    PT Comments    POD # 3 Pt progressing slowly and still requires + 2 assist for all mobility.    Follow Up Recommendations  SNF;Home health PT (daughter declines SNF and plans to provide care.  Daughter declines ambualnce transport to home and plans to have a family member help her transport)     Equipment Recommendations       Recommendations for Other Services       Precautions / Restrictions Precautions Precautions: Fall;Knee Precaution Comments: performs better without Restrictions Weight Bearing Restrictions: No Other Position/Activity Restrictions: WBAT    Mobility  Bed Mobility Overal bed mobility: Needs Assistance Bed Mobility: Supine to Sit     Supine to sit: Max assist;+2 for physical assistance;+2 for safety/equipment     General bed mobility comments: + 2 assist side to side rolling for peri care as pt is incont.  + 2 assist supine to sit with severe posterior lean.    Transfers Overall transfer level: Needs assistance Equipment used: Rolling walker (2 wheeled) Transfers: Sit to/from [facility] Sit to Stand: Mod assist;+2 physical assistance;+2 safety/equipment Stand pivot transfers: Mod assist;+2 physical assistance       General transfer comment: sit to stand from elevated bed + 2 assist with 50% VC's on proper hand placement.  Severe posterior lean.  Difficulty weight shifting to complete 1/4 turn to [facility].  75% VC's to complete turn and sequencing.  Pt attempted to sit prior to completing turn.  Assisted off BSC + 2 assist and 75% VC's on proper hand placement.  Increased difficulty rising from lower surface level.  Severe posterior lean.  Assisted with peri care and balance.  HIGH FALL RISK backward.   Ambulation/Gait Ambulation/Gait assistance: Max assist;+2 physical  assistance;+2 safety/equipment Ambulation Distance (Feet): 3 Feet Assistive device: Rolling walker (2 wheeled) Gait Pattern/deviations: Step-to pattern;Decreased step length - right;Decreased step length - left;Decreased stance time - right;Trunk flexed;Shuffle;Narrow base of support     General Gait Details: pt was able to amb 3 feet forward as recliner followed behind.  75% VC's on proper sequencing and erect posture.  Severe posterior lean.  Poor self correction to midline.  Delayed coorective reaction.  HIGH FALL RISK.    Stairs            Wheelchair Mobility    Modified Rankin (Stroke Patients Only)       Balance                                    Cognition Arousal/Alertness: Awake/alert Behavior During Therapy: WFL for tasks assessed/performed Overall Cognitive Status: Within Functional Limits for tasks assessed                 General Comments: appears more alert today    Exercises      General Comments        Pertinent Vitals/Pain Pain Assessment: Faces Pain Score: 4  Faces Pain Scale: Hurts a little bit Pain Location: L knee Pain Descriptors / Indicators: Discomfort Pain Intervention(s): Monitored during session;Repositioned    Home Living                      Prior Function  PT Goals (current goals can now be found in the care plan section) Progress towards PT goals: Progressing toward goals    Frequency       PT Plan Discharge plan needs to be updated    Co-evaluation   Reason for Co-Treatment: For patient/therapist safety PT goals addressed during session: Mobility/safety with mobility OT goals addressed during session: ADL's and self-care     End of Session Equipment Utilized During Treatment: Gait belt   Patient left: in chair;with call bell/phone within reach;with family/visitor present     Time: [id number] PT Time Calculation (min) (ACUTE ONLY): 40 min  Charges:  $Gait Training:  8-22 mins $Therapeutic Activity: 23-37 mins                    G Codes:      Rica Koyanagi  PTA [facility]  Acute  Rehab Pager      [phone]

## 2016-04-13 MED ORDER — RIVAROXABAN 15 MG PO TABS: 15.0000 mg | ORAL_TABLET | Freq: Every day | ORAL | Status: AC

## 2016-04-13 MED ORDER — DIGOXIN 62.5 MCG PO TABS
0.0625 mg | ORAL_TABLET | Freq: Every day | ORAL | Status: AC
Start: 2016-04-13 — End: ?

## 2016-04-13 NOTE — Progress Notes (Signed)
   Subjective: 4 Days Post-Op Procedure(s) (LRB): LEFT TOTAL KNEE ARTHROPLASTY (Left)  Pt doing fair this morning C/o moderate pain in the knee  She has not been up with therapy to walk and is max assist from report of daughters and therapy Possible d/c today vs tomorrow depending on how well she does with therapy She has been cleared from cardiology standpoint for d/c home Patient reports pain as moderate.  Objective:   VITALS:   Filed Vitals:   04/13/16 0420 04/13/16 0900  BP: 117/71   Pulse: 86 94  Temp: 98.9 F (37.2 C)   Resp: 18     Left knee incision healing well nv intact distally No rashes or edema  LABS  Recent Labs  04/11/16 0203 04/12/16 0435  HGB 10.4* 10.0*  HCT 30.9* 29.2*  WBC 6.7 7.2  PLT 98* 109*     Recent Labs  04/11/16 0203  NA 139  K 3.5  BUN 9  CREATININE 0.70  GLUCOSE 137*     Assessment/Plan: 4 Days Post-Op Procedure(s) (LRB): LEFT TOTAL KNEE ARTHROPLASTY (Left) Recommend OT/PT therapy today and if she is able to walk several feet she may be d/c home today with daughters Otherwise d/c may need to be postponed until tomorrow Pt and family aware Continue pain management and pulmonary toilet Please page me today if she is cleared by therapy to go home for d/c order   [place], PA-C  04/13/2016, 10:02 AM

## 2016-04-13 NOTE — Progress Notes (Signed)
PROGRESS NOTE  SUNJAI BRUEGGEN [id number] DOB: December 07, 1924 DOA: 04/09/2016 PCP: Mathews Argyle, MD  HPI/Recap of past 24 hours: 40F who underwent L knee arthroplasty 6/13 and was found to be in Afib RVR for which hospitalist was consulted. She was given several doses of IV digoxin after which she has been in rate controlled Afib, cardiology was consulted due to recurrent Afib and questions from family about anticoagulation.  She is not rate controlled on BB and Digoxin. She feels well, but tired. Per daughter she has not been doing much with therapy but they want to take her home.  Assessment/Plan: Active Problems:   Status post total left knee replacement - per primary ortho team   Pressure ulcer  Afib RVR - uncontrolled, electrolytes normal. TSH normal, Echo unremarkable. Appreciate cardiology management. Now on PO digoxin. Regarding A/C for Afib, she will d/c on Xarelto per Ortho, and transition to full anticoagulation for Afib once cleared by ortho.  Code Status: FULL   Family Communication: DW patient and daughter in the room this AM.  Disposition Plan: Per ortho, plan is for HHPT. No barrier to d/c today.    Objective: Filed Vitals:   04/12/16 1711 04/12/16 2152 04/13/16 0420 04/13/16 0900  BP:  103/58 117/71   Pulse:  82 86 94  Temp:  99.6 F (37.6 C) 98.9 F (37.2 C)   TempSrc:  Oral Oral   Resp:  18 18   Height:      Weight:      SpO2: 98% 98% 97%     Intake/Output Summary (Last 24 hours) at 04/13/16 1040 Last data filed at 04/12/16 1300  Gross per 24 hour  Intake    120 ml  Output      0 ml  Net    120 ml   Filed Weights   04/09/16 1018  Weight: 63.504 kg (140 lb)    Exam: General:  Alert, oriented, calm, in no acute distress Eyes: pupils round and reactive to light and accomodation, clear sclerea Neck: supple, no masses, trachea mildline  Cardiovascular: irregular, no murmurs or rubs, no peripheral edema  Respiratory: clear to auscultation  bilaterally, no wheezes, no crackles  Abdomen: soft, nontender, nondistended, normal bowel tones heard  Skin: dry, no rashes  Musculoskeletal: no joint effusions, left knee bandage Psychiatric: appropriate affect, normal speech  Neurologic: extraocular muscles intact, clear speech, moving all extremities with intact sensorium    Data Reviewed: CBC:  Recent Labs Lab 04/10/16 0400 04/11/16 0203 04/12/16 0435  WBC 5.6 6.7 7.2  HGB 9.3* 10.4* 10.0*  HCT 28.1* 30.9* 29.2*  MCV 92.4 94.5 92.1  PLT 101* 98* 0000000*   Basic Metabolic Panel:  Recent Labs Lab 04/10/16 0400 04/11/16 0203  NA 140 139  K 3.8 3.5  CL 108 107  CO2 27 27  GLUCOSE 141* 137*  BUN 12 9  CREATININE 0.83 0.70  CALCIUM 8.1* 8.3*   GFR: Estimated Creatinine Clearance: 41.5 mL/min (by C-G formula based on Cr of 0.7). Liver Function Tests: No results for input(s): AST, ALT, ALKPHOS, BILITOT, PROT, ALBUMIN in the last 168 hours. No results for input(s): LIPASE, AMYLASE in the last 168 hours. No results for input(s): AMMONIA in the last 168 hours. Coagulation Profile: No results for input(s): INR, PROTIME in the last 168 hours. Cardiac Enzymes:  Recent Labs Lab 04/10/16 2126 04/11/16 0203 04/11/16 0933  TROPONINI <0.03 <0.03 <0.03   BNP (last 3 results) No results for input(s): PROBNP in the  last 8760 hours. HbA1C: No results for input(s): HGBA1C in the last 72 hours. CBG: No results for input(s): GLUCAP in the last 168 hours. Lipid Profile: No results for input(s): CHOL, HDL, LDLCALC, TRIG, CHOLHDL, LDLDIRECT in the last 72 hours. Thyroid Function Tests:  Recent Labs  04/10/16 2126  TSH 0.899  FREET4 1.01   Anemia Panel: No results for input(s): VITAMINB12, FOLATE, FERRITIN, TIBC, IRON, RETICCTPCT in the last 72 hours. Urine analysis:    Component Value Date/Time   COLORURINE YELLOW 09/16/2013 [place] 09/16/2013 1539   LABSPEC 1.023 09/16/2013 1539   PHURINE 6.5  09/16/2013 1539   GLUCOSEU NEGATIVE 09/16/2013 1539   HGBUR NEGATIVE 09/16/2013 1539   BILIRUBINUR NEGATIVE 09/16/2013 1539   KETONESUR NEGATIVE 09/16/2013 1539   PROTEINUR NEGATIVE 09/16/2013 1539   UROBILINOGEN 0.2 09/16/2013 1539   NITRITE NEGATIVE 09/16/2013 1539   LEUKOCYTESUR NEGATIVE 09/16/2013 1539   Sepsis Labs: @LABRCNTIP (procalcitonin:4,lacticidven:4)  ) No results found for this or any previous visit (from the past 240 hour(s)).    Studies: No results found.  Scheduled Meds: . bisoprolol  5 mg Oral QHS  . carbidopa-levodopa  1 tablet Oral TID  . chlorhexidine  15 mL Mouth/Throat Daily  . clonazePAM  0.5 mg Oral Daily  . digoxin  0.0625 mg Oral Daily  . donepezil  10 mg Oral QHS  . ferrous sulfate  325 mg Oral TID PC  . pantoprazole  40 mg Oral Daily  . polyvinyl alcohol   Both Eyes TID  . rivaroxaban  10 mg Oral Q breakfast  . vitamin B-12  100 mcg Oral Daily    Continuous Infusions:    LOS: 4 days   Time spent: 24 min  Undrea Shipes Marry Guan, MD [facility] Pager [phone]  If 7PM-7AM, please contact night-coverage [email or web address] Password [email or web address] 04/13/2016, 10:40 AM

## 2016-04-13 NOTE — Progress Notes (Signed)
   SUBJECTIVE: The patient is doing well today.  At this time, she denies chest pain, shortness of breath, or any new concerns.  . bisoprolol  5 mg Oral QHS  . carbidopa-levodopa  1 tablet Oral TID  . chlorhexidine  15 mL Mouth/Throat Daily  . clonazePAM  0.5 mg Oral Daily  . digoxin  0.0625 mg Oral Daily  . donepezil  10 mg Oral QHS  . ferrous sulfate  325 mg Oral TID PC  . pantoprazole  40 mg Oral Daily  . polyvinyl alcohol   Both Eyes TID  . rivaroxaban  10 mg Oral Q breakfast  . vitamin B-12  100 mcg Oral Daily      OBJECTIVE: Physical Exam: Filed Vitals:   04/12/16 1500 04/12/16 1711 04/12/16 2152 04/13/16 0420  BP: 105/45  103/58 117/71  Pulse: 82  82 86  Temp: 99 F (37.2 C)  99.6 F (37.6 C) 98.9 F (37.2 C)  TempSrc: Oral  Oral Oral  Resp: 20  18 18   Height:      Weight:      SpO2: 100% 98% 98% 97%    Intake/Output Summary (Last 24 hours) at 04/13/16 0746 Last data filed at 04/12/16 1300  Gross per 24 hour  Intake    240 ml  Output      0 ml  Net    240 ml    Telemetry reveals rate controlled afib  GEN- The patient is well appearing, sleeping but arouses Head- normocephalic, atraumatic Eyes-  Sclera clear, conjunctiva pink Ears- hearing intact Oropharynx- clear Neck- supple,   Lungs- Clear to ausculation bilaterally, normal work of breathing Heart- irregular rate and rhythm  GI- soft, NT, ND, + BS Extremities- no clubbing, cyanosis, or edema Skin- no rash or lesion  LABS: Basic Metabolic Panel:  Recent Labs  04/11/16 0203  NA 139  K 3.5  CL 107  CO2 27  GLUCOSE 137*  BUN 9  CREATININE 0.70  CALCIUM 8.3*   Liver Function Tests: No results for input(s): AST, ALT, ALKPHOS, BILITOT, PROT, ALBUMIN in the last 72 hours. No results for input(s): LIPASE, AMYLASE in the last 72 hours. CBC:  Recent Labs  04/11/16 0203 04/12/16 0435  WBC 6.7 7.2  HGB 10.4* 10.0*  HCT 30.9* 29.2*  MCV 94.5 92.1  PLT 98* 109*   Cardiac  Enzymes:  Recent Labs  04/10/16 2126 04/11/16 0203 04/11/16 0933  TROPONINI <0.03 <0.03 <0.03   Thyroid Function Tests:  Recent Labs  04/10/16 2126  TSH 0.899   ASSESSMENT AND PLAN:  Active Problems:   Status post total left knee replacement   Pressure ulcer   Atrial fibrillation with RVR (HCC)  Rate controlled afib Continue xarelto and increase to therapeutic dose once able from a surgical standpoint No further inpatient cardiology evaluation planned  Cardiology to see as needed while here Will schedule follow-up in AF clinic in 1-2 weeks and with Dr Percival Spanish in 4-6 weeks    Thompson Grayer, MD 04/13/2016 7:46 AM

## 2016-04-13 NOTE — Discharge Summary (Signed)
Physician Discharge Summary   Patient ID: Stacey Jefferson MRN: [id number] DOB/AGE: 80-Apr-1926 80 y.o.  Admit date: 04/09/2016 Discharge date: 04/13/2016  Admission Diagnoses:  Active Problems:   Status post total left knee replacement   Pressure ulcer   Atrial fibrillation with RVR [facility])   Discharge Diagnoses:  Same   Surgeries: Procedure(s): LEFT TOTAL KNEE ARTHROPLASTY on 04/09/2016   Consultants: PT/OT  Discharged Condition: Stable  Hospital Course: Stacey Jefferson is an 82 y.o. female who was admitted 04/09/2016 with a chief complaint of left knee pain, and found to have a diagnosis of left knee end stage osteoarthritis.  They were brought to the operating room on 04/09/2016 and underwent the above named procedures.    The patient had an uncomplicated hospital course and was stable for discharge.  Recent vital signs:  Filed Vitals:   04/13/16 0420 04/13/16 0900  BP: 117/71   Pulse: 86 94  Temp: 98.9 F (37.2 C)   Resp: 18     Recent laboratory studies:  Results for orders placed or performed during the hospital encounter of 04/09/16  CBC  Result Value Ref Range   WBC 5.6 4.0 - 10.5 K/uL   RBC 3.04 (L) 3.87 - 5.11 MIL/uL   Hemoglobin 9.3 (L) 12.0 - 15.0 g/dL   HCT 28.1 (L) 36.0 - 46.0 %   MCV 92.4 78.0 - 100.0 fL   MCH 30.6 26.0 - 34.0 pg   MCHC 33.1 30.0 - 36.0 g/dL   RDW 13.0 11.5 - 15.5 %   Platelets 101 (L) 150 - 400 K/uL  Basic metabolic panel  Result Value Ref Range   Sodium 140 135 - 145 mmol/L   Potassium 3.8 3.5 - 5.1 mmol/L   Chloride 108 101 - 111 mmol/L   CO2 27 22 - 32 mmol/L   Glucose, Bld 141 (H) 65 - 99 mg/dL   BUN 12 6 - 20 mg/dL   Creatinine, Ser 0.83 0.44 - 1.00 mg/dL   Calcium 8.1 (L) 8.9 - 10.3 mg/dL   GFR calc non Af Amer >60 >60 mL/min   GFR calc Af Amer >60 >60 mL/min   Anion gap 5 5 - 15  CBC  Result Value Ref Range   WBC 6.7 4.0 - 10.5 K/uL   RBC 3.27 (L) 3.87 - 5.11 MIL/uL   Hemoglobin 10.4 (L) 12.0 - 15.0 g/dL   HCT 30.9 (L)  36.0 - 46.0 %   MCV 94.5 78.0 - 100.0 fL   MCH 31.8 26.0 - 34.0 pg   MCHC 33.7 30.0 - 36.0 g/dL   RDW 13.1 11.5 - 15.5 %   Platelets 98 (L) 150 - 400 K/uL  Basic metabolic panel  Result Value Ref Range   Sodium 139 135 - 145 mmol/L   Potassium 3.5 3.5 - 5.1 mmol/L   Chloride 107 101 - 111 mmol/L   CO2 27 22 - 32 mmol/L   Glucose, Bld 137 (H) 65 - 99 mg/dL   BUN 9 6 - 20 mg/dL   Creatinine, Ser 0.70 0.44 - 1.00 mg/dL   Calcium 8.3 (L) 8.9 - 10.3 mg/dL   GFR calc non Af Amer >60 >60 mL/min   GFR calc Af Amer >60 >60 mL/min   Anion gap 5 5 - 15  TSH  Result Value Ref Range   TSH 0.899 0.350 - 4.500 uIU/mL  T4, free  Result Value Ref Range   Free T4 1.01 0.61 - 1.12 ng/dL  Troponin I (q  6hr x 3)  Result Value Ref Range   Troponin I <0.03 <0.031 ng/mL  Troponin I (q 6hr x 3)  Result Value Ref Range   Troponin I <0.03 <0.031 ng/mL  Troponin I (q 6hr x 3)  Result Value Ref Range   Troponin I <0.03 <0.031 ng/mL  CBC  Result Value Ref Range   WBC 7.2 4.0 - 10.5 K/uL   RBC 3.17 (L) 3.87 - 5.11 MIL/uL   Hemoglobin 10.0 (L) 12.0 - 15.0 g/dL   HCT 29.2 (L) 36.0 - 46.0 %   MCV 92.1 78.0 - 100.0 fL   MCH 31.5 26.0 - 34.0 pg   MCHC 34.2 30.0 - 36.0 g/dL   RDW 12.8 11.5 - 15.5 %   Platelets 109 (L) 150 - 400 K/uL  ECHO COMPLETE  Result Value Ref Range   Weight 2240 oz   Height 61 in   BP 121/49 mmHg   LV PW d 13.1 (A) 0.6 - 1.1 mm   FS 8 (A) 28 - 44 %   LA vol 90.2 mL   LA ID, A-P, ES 35 mm   IVS/LV PW RATIO, ED 1.15    Reg peak vel 269 cm/s   LA diam index 2.1 cm/m2   LA vol A4C 86.3 ml   LVOT diameter 17 mm   LVOT area 2.27 cm2   MV pk E vel 56.8 m/s   TR max vel 269 cm/s   LA vol index 54 mL/m2   LA diam end sys 35 mm   TAPSE 17.3 mm    Discharge Medications:     Medication List    STOP taking these medications        acetaminophen 160 MG/5ML solution  Commonly known as:  TYLENOL     aspirin 81 MG chewable tablet      TAKE these medications         BENEFIBER Powd  Take 10 mLs by mouth 2 (two) times daily.     bisoprolol 5 MG tablet  Commonly known as:  ZEBETA  Take 5 mg by mouth at bedtime.     carbidopa-levodopa 25-100 MG tablet  Commonly known as:  SINEMET  Take 1 tablet by mouth 3 (three) times daily.     chlorhexidine 0.12 % solution  Commonly known as:  PERIDEX  Use as directed 15 mLs in the mouth or throat daily.     clonazePAM 0.5 MG tablet  Commonly known as:  KLONOPIN  Take 1 tablet (0.5 mg total) by mouth at bedtime as needed.     donepezil 10 MG tablet  Commonly known as:  ARICEPT  Take 1 tablet (10 mg total) by mouth at bedtime.     HYDROcodone-acetaminophen 7.5-325 MG tablet  Commonly known as:  NORCO  Take 1 tablet by mouth every 4 (four) hours as needed for moderate pain or severe pain.     liver oil-zinc oxide 40 % ointment  Commonly known as:  DESITIN  Apply 1 application topically as needed for irritation (to bottom after each BM-using a generic ' Butt paste w zinc").     loperamide 1 MG/5ML solution  Commonly known as:  IMODIUM  Take 2 mg by mouth as needed for diarrhea or loose stools.     loratadine 10 MG tablet  Commonly known as:  CLARITIN  Take 10 mg by mouth daily.     methocarbamol 500 MG tablet  Commonly known as:  ROBAXIN  Take 1 tablet (  500 mg total) by mouth every 6 (six) hours as needed for muscle spasms.     omeprazole 20 MG capsule  Commonly known as:  PRILOSEC  Take 20 mg by mouth 2 (two) times daily. Opens capsule and mixes in with food or water     rivaroxaban 10 MG Tabs tablet  Commonly known as:  XARELTO  Take 1 tablet (10 mg total) by mouth daily with breakfast.     SALACYN 6 % Lotn  Generic drug:  Salicylic Acid  Apply 1 application topically as needed (for legs).     SYSTANE OP  Place 1 drop into both eyes 3 (three) times daily.     VITAMIN B 12 PO  Take 1 tablet by mouth daily.        Diagnostic Studies: No results found.  Disposition: 06-Home-Health  Care Svc      Discharge Instructions    Call MD / Call 911    Complete by:  As directed   If you experience chest pain or shortness of breath, CALL 911 and be transported to the hospital emergency room.  If you develope a fever above 101 F, pus (white drainage) or increased drainage or redness at the wound, or calf pain, call your surgeon's office.     Call MD / Call 911    Complete by:  As directed   If you experience chest pain or shortness of breath, CALL 911 and be transported to the hospital emergency room.  If you develope a fever above 101 F, pus (white drainage) or increased drainage or redness at the wound, or calf pain, call your surgeon's office.     Constipation Prevention    Complete by:  As directed   Drink plenty of fluids.  Prune juice may be helpful.  You may use a stool softener, such as Colace (over the counter) 100 mg twice a day.  Use MiraLax (over the counter) for constipation as needed.     Constipation Prevention    Complete by:  As directed   Drink plenty of fluids.  Prune juice may be helpful.  You may use a stool softener, such as Colace (over the counter) 100 mg twice a day.  Use MiraLax (over the counter) for constipation as needed.     Diet - low sodium heart healthy    Complete by:  As directed      Diet - low sodium heart healthy    Complete by:  As directed      Discharge instructions    Complete by:  As directed   INSTRUCTIONS AFTER JOINT REPLACEMENT   Remove items at home which could result in a fall. This includes throw rugs or furniture in walking pathways ICE to the affected joint every three hours while awake for 30 minutes at a time, for at least the first 3-5 days, and then as needed for pain and swelling.  Continue to use ice for pain and swelling. You may notice swelling that will progress down to the foot and ankle.  This is normal after surgery.  Elevate your leg when you are not up walking on it.   Continue to use the breathing machine you got  in the hospital (incentive spirometer) which will help keep your temperature down.  It is common for your temperature to cycle up and down following surgery, especially at night when you are not up moving around and exerting yourself.  The breathing machine keeps your lungs expanded and  your temperature down.   DIET:  As you were doing prior to hospitalization, we recommend a well-balanced diet.  DRESSING / WOUND CARE / SHOWERING  You may change your dressing every day with sterile gauze.  Please use good hand washing techniques before changing the dressing.  Do not use any lotions or creams on the incision until instructed by your surgeon.  ACTIVITY  Increase activity slowly as tolerated, but follow the weight bearing instructions below.   No driving for 6 weeks or until further direction given by your physician.  You cannot drive while taking narcotics.  No lifting or carrying greater than 10 lbs. until further directed by your surgeon. Avoid periods of inactivity such as sitting longer than an hour when not asleep. This helps prevent blood clots.  You may return to work once you are authorized by your doctor.     WEIGHT BEARING   Weight bearing as tolerated with assist device (walker, cane, etc) as directed, use it as long as suggested by your surgeon or therapist, typically at least 4-6 weeks.   EXERCISES  Results after joint replacement surgery are often greatly improved when you follow the exercise, range of motion and muscle strengthening exercises prescribed by your doctor. Safety measures are also important to protect the joint from further injury. Any time any of these exercises cause you to have increased pain or swelling, decrease what you are doing until you are comfortable again and then slowly increase them. If you have problems or questions, call your caregiver or physical therapist for advice.   Rehabilitation is important following a joint replacement. After just a few  days of immobilization, the muscles of the leg can become weakened and shrink (atrophy).  These exercises are designed to build up the tone and strength of the thigh and leg muscles and to improve motion. Often times heat used for twenty to thirty minutes before working out will loosen up your tissues and help with improving the range of motion but do not use heat for the first two weeks following surgery (sometimes heat can increase post-operative swelling).   These exercises can be done on a training (exercise) mat, on the floor, on a table or on a bed. Use whatever works the best and is most comfortable for you.    Use music or television while you are exercising so that the exercises are a pleasant break in your day. This will make your life better with the exercises acting as a break in your routine that you can look forward to.   Perform all exercises about fifteen times, three times per day or as directed.  You should exercise both the operative leg and the other leg as well.  Exercises include:   Quad Sets - Tighten up the muscle on the front of the thigh (Quad) and hold for 5-10 seconds.   Straight Leg Raises - With your knee straight (if you were given a brace, keep it on), lift the leg to 60 degrees, hold for 3 seconds, and slowly lower the leg.  Perform this exercise against resistance later as your leg gets stronger.  Leg Slides: Lying on your back, slowly slide your foot toward your buttocks, bending your knee up off the floor (only go as far as is comfortable). Then slowly slide your foot back down until your leg is flat on the floor again.  Angel Wings: Lying on your back spread your legs to the side as far apart as you can  without causing discomfort.  Hamstring Strength:  Lying on your back, push your heel against the floor with your leg straight by tightening up the muscles of your buttocks.  Repeat, but this time bend your knee to a comfortable angle, and push your heel against the  floor.  You may put a pillow under the heel to make it more comfortable if necessary.   A rehabilitation program following joint replacement surgery can speed recovery and prevent re-injury in the future due to weakened muscles. Contact your doctor or a physical therapist for more information on knee rehabilitation.    CONSTIPATION  Constipation is defined medically as fewer than three stools per week and severe constipation as less than one stool per week.  Even if you have a regular bowel pattern at home, your normal regimen is likely to be disrupted due to multiple reasons following surgery.  Combination of anesthesia, postoperative narcotics, change in appetite and fluid intake all can affect your bowels.   YOU MUST use at least one of the following options; they are listed in order of increasing strength to get the job done.  They are all available over the counter, and you may need to use some, POSSIBLY even all of these options:    Drink plenty of fluids (prune juice may be helpful) and high fiber foods Colace 100 mg by mouth twice a day  Senokot for constipation as directed and as needed Dulcolax (bisacodyl), take with full glass of water  Miralax (polyethylene glycol) once or twice a day as needed.  If you have tried all these things and are unable to have a bowel movement in the first 3-4 days after surgery call either your surgeon or your primary doctor.    If you experience loose stools or diarrhea, hold the medications until you stool forms back up.  If your symptoms do not get better within 1 week or if they get worse, check with your doctor.  If you experience "the worst abdominal pain ever" or develop nausea or vomiting, please contact the office immediately for further recommendations for treatment.   ITCHING:  If you experience itching with your medications, try taking only a single pain pill, or even half a pain pill at a time.  You can also use Benadryl over the counter for  itching or also to help with sleep.   TED HOSE STOCKINGS:  Use stockings on both legs until for at least 2 weeks or as directed by physician office. They may be removed at night for sleeping.  MEDICATIONS:  See your medication summary on the "After Visit Summary" that nursing will review with you.  You may have some home medications which will be placed on hold until you complete the course of blood thinner medication.  It is important for you to complete the blood thinner medication as prescribed.  PRECAUTIONS:  If you experience chest pain or shortness of breath - call 911 immediately for transfer to the hospital emergency department.   If you develop a fever greater that 101 F, purulent drainage from wound, increased redness or drainage from wound, foul odor from the wound/dressing, or calf pain - CONTACT YOUR SURGEON.                                                   FOLLOW-UP APPOINTMENTS:  If  you do not already have a post-op appointment, please call the office for an appointment to be seen by your surgeon.  Guidelines for how soon to be seen are listed in your "After Visit Summary", but are typically between 1-4 weeks after surgery.  OTHER INSTRUCTIONS:   Knee Replacement:  Do not place pillow under knee, focus on keeping the knee straight while resting. CPM instructions: 0-90 degrees, 2 hours in the morning, 2 hours in the afternoon, and 2 hours in the evening. Place foam block, curve side up under heel at all times except when in CPM or when walking.  DO NOT modify, tear, cut, or change the foam block in any way.  MAKE SURE YOU:  Understand these instructions.  Get help right away if you are not doing well or get worse.    Thank you for letting us be a part of your medical care team.  It is a privilege we respect greatly.  We hope these instructions will help you stay on track for a fast and full recovery!     Increase activity slowly as tolerated    Complete by:  As directed       Increase activity slowly as tolerated    Complete by:  As directed            Follow-up Information    Follow up with GIOFFRE,RONALD A, MD. Schedule an appointment as soon as possible for a visit in 2 weeks.   Specialty:  Orthopedic Surgery   Contact information:   [address] [place] [address] [phone]       Follow up with [facility].   Why:  Catalina Antigua has been requested for your physical therapist   Contact information:   [place] [address] [phone]        Signed: Ventura Bruns 04/13/2016, 11:53 AM

## 2016-04-13 NOTE — Progress Notes (Signed)
Occupational Therapy Treatment Patient Details Name: Stacey Jefferson MRN: [id number] DOB: Mar 19, 1925 Today's Date: 04/13/2016    History of present illness 82 yo female s/p L TKA PMH: R TKA mild cognitive deficits, dysaphagia, IBS, carpal tunnel syndrome, HA, HTN, L eye lid droop,    OT comments  Family plans to take patient home with 2 person (A) at all times initially. Pt has all necessary DME for home. Family advised to use w/c for basic transfers to help prevent falls within the home. Pt pleasant and agreeable to all activities. Family does not want placement and insist on return home.   Follow Up Recommendations  Home health OT;Supervision/Assistance - 24 hour;Other (comment)    Equipment Recommendations  None recommended by OT    Recommendations for Other Services      Precautions / Restrictions Precautions Precautions: Fall;Knee Precaution Comments: much better with transfers without use of KI; L knee stable during gait/stance on L Restrictions Weight Bearing Restrictions: No Other Position/Activity Restrictions: WBAT       Mobility Bed Mobility Overal bed mobility: Needs Assistance Bed Mobility: Supine to Sit     Supine to sit: Max assist     General bed mobility comments: educated family on the use of standard sheet at home as a chuck pad and to help with mobilty. Pt required use of bed rail during transfer and family interested in purchase of rail. Pt educated on all topics discussed with family  Transfers Overall transfer level: Needs assistance Equipment used: Rolling walker (2 wheeled) Transfers: Sit to/from Stand Sit to Stand: Mod assist         General transfer comment: cues for hand placement and (A) to anterior weight shift. pt with heavy reliance on R LE    Balance             Standing balance-Leahy Scale: Poor Standing balance comment: reliant on UEs for balance                   ADL Overall ADL's : Needs  assistance/impaired Eating/Feeding: Set up   Grooming: Wash/dry hands;Min guard                   Toilet Transfer: Moderate assistance;Stand-pivot;Ambulation;RW;BSC Toilet Transfer Details (indicate cue type and reason): cues for sequence and transfer to the R Toileting- Clothing Manipulation and Hygiene: Total assistance;Sit to/from stand Toileting - Clothing Manipulation Details (indicate cue type and reason): family reports at baseline completing peri care     Functional mobility during ADLs: Minimal assistance;Rolling walker General ADL Comments: pt attempting to sit prematurely during session x2. Family educated on the use of w/c at home to help with care and basic transfer. Pt currently has a 3n1 for stand pivot transfers. pt however would need to sit in w/c and be pushed to recliner. Pt needs cues to remain standing and (A) to swing hips to sit safely.       Vision                     Perception     Praxis      Cognition   Behavior During Therapy: [facility] for tasks assessed/performed Overall Cognitive Status: Impaired/Different from baseline Area of Impairment: Safety/judgement;Awareness   Current Attention Level: Sustained Memory: Decreased short-term memory  Following Commands: Follows one step commands with increased time Safety/Judgement: Decreased awareness of safety Awareness: Emergent   General Comments: Pt talking on tangants not related to task occurring at this  time. Daughters present to help guide care. Pt referring to family to answer questions at times.     Extremity/Trunk Assessment               Exercises Total Joint Exercises Ankle Circles/Pumps: AROM;Both;15 reps Heel Slides: AAROM;Left;10 reps   Shoulder Instructions       General Comments      Pertinent Vitals/ Pain       Pain Assessment: No/denies pain Faces Pain Scale: Hurts little more Pain Location: L knee with flexion Pain Descriptors / Indicators:  Grimacing;Guarding;Sore Pain Intervention(s): Limited activity within patient's tolerance;Monitored during session;Repositioned;Ice applied  Home Living                                          Prior Functioning/Environment              Frequency Min 2X/week     Progress Toward Goals  OT Goals(current goals can now be found in the care plan section)  Progress towards OT goals: Progressing toward goals  Acute Rehab OT Goals Patient Stated Goal: to go home OT Goal Formulation: With patient/family Time For Goal Achievement: 04/24/16 Potential to Achieve Goals: Good ADL Goals Pt Will Perform Upper Body Bathing: with min assist;sitting Pt Will Transfer to Toilet: with mod assist;stand pivot transfer;bedside commode Pt Will Perform Toileting - Clothing Manipulation and hygiene: with mod assist;sit to/from stand  Plan Discharge plan remains appropriate    Co-evaluation                 End of Session Equipment Utilized During Treatment: Gait belt;Rolling walker   Activity Tolerance Patient tolerated treatment well   Patient Left in chair;with call bell/phone within reach;with family/visitor present   Nurse Communication Mobility status;Precautions        Time: 1020-1059 OT Time Calculation (min): 39 min  Charges: OT General Charges $OT Visit: 1 Procedure OT Treatments $Self Care/Home Management : 38-52 mins  Parke Poisson B 04/13/2016, 12:29 PM  Jeri Modena   OTR/L PagerOH:[phone] Office: [phone] .

## 2016-04-13 NOTE — Progress Notes (Signed)
Physical Therapy Treatment Patient Details Name: Stacey Jefferson MRN: [id number] DOB: [birth year] Today's Date: 04/13/2016    History of Present Illness LTKA    PT Comments    Making excellent gains today--able to amb 30' with min to min/guard; family supportive and plans 2 person assist 24/7; pt has DME; plans to D/C home today  Follow Up Recommendations  Home health PT;Supervision/Assistance - 24 hour     Equipment Recommendations  None recommended by PT    Recommendations for Other Services       Precautions / Restrictions Precautions Precautions: Fall;Knee Precaution Comments: much better with transfers without use of KI; L knee stable during gait/stance on L Restrictions Weight Bearing Restrictions: No Other Position/Activity Restrictions: WBAT    Mobility  Bed Mobility               General bed mobility comments: OOB in chair on PT arrival--finishing OT session  Transfers Overall transfer level: Needs assistance Equipment used: Rolling walker (2 wheeled) Transfers: Sit to/from Stand Sit to Stand: Min assist         General transfer comment: multi-modal cues for anterior wt shift, L knee flexion, hand placement for stand to sit; assist to control descent  Ambulation/Gait Ambulation/Gait assistance: Min assist;Min guard Ambulation Distance (Feet): 30 Feet Assistive device: Rolling walker (2 wheeled) Gait Pattern/deviations: Step-through pattern;Decreased stride length;Trunk flexed;Narrow base of support     General Gait Details: amb much improved today, although reported significant fatigue after 30' distance; pt requiring less assist with incr distance, needing at min/min-guard for balance and safety throughout, occasional cues for RW positioning, posture, upward gaze   Stairs            Wheelchair Mobility    Modified Rankin (Stroke Patients Only)       Balance             Standing balance-Leahy Scale: Poor Standing balance  comment: reliant on UEs for balance                    Cognition Arousal/Alertness: Awake/alert Behavior During Therapy: WFL for tasks assessed/performed Overall Cognitive Status: Within Functional Limits for tasks assessed     Current Attention Level: Sustained Memory: Decreased short-term memory Following Commands: Follows one step commands with increased time       General Comments: alert and following one step commands copnsisitently today; does not recall having therapy, havign ice, meds, etc; family present and filling in gaps    Exercises Total Joint Exercises Ankle Circles/Pumps: AROM;Both;15 reps Heel Slides: AAROM;Left;10 reps    General Comments        Pertinent Vitals/Pain Pain Assessment: Faces Faces Pain Scale: Hurts little more Pain Location: L knee with flexion Pain Descriptors / Indicators: Grimacing;Guarding;Sore Pain Intervention(s): Limited activity within patient's tolerance;Monitored during session;Repositioned;Ice applied    Home Living                      Prior Function            PT Goals (current goals can now be found in the care plan section) Acute Rehab PT Goals Patient Stated Goal: to go home PT Goal Formulation: With patient/family Time For Goal Achievement: 04/17/16 Potential to Achieve Goals: Good Progress towards PT goals: Progressing toward goals    Frequency  7X/week    PT Plan Current plan remains appropriate    Co-evaluation  End of Session Equipment Utilized During Treatment: Gait belt Activity Tolerance: Patient tolerated treatment well Patient left: in chair;with call bell/phone within reach;with family/visitor present     Time: [id number] PT Time Calculation (min) (ACUTE ONLY): 23 min  Charges:  $Gait Training: 23-37 mins                    G Codes:      Stacey Jefferson April 25, 2016, 11:41 AM

## 2016-04-13 NOTE — Progress Notes (Signed)
Questions about cardiac meds at discharge. Digoxin, and Xarelto.  Reviewed notes. Dig at low-dose recommended, IM has ordered.  Full-dose anticoag recommended when OK w/ ortho.  Orthopedic MD contacted and said ok to full dose anticoag Pharmacy stated Xarelto should be 15 mg qd due to renal function. Rx sent in electronically for the [facility] and staff message sent to the Afib clinic for f/u appt.  Rosaria Ferries, PA-C 04/13/2016 1:40 PM [facility] [phone]

## 2016-04-14 DIAGNOSIS — G3184 Mild cognitive impairment, so stated: Secondary | ICD-10-CM | POA: Diagnosis not present

## 2016-04-14 DIAGNOSIS — Z471 Aftercare following joint replacement surgery: Secondary | ICD-10-CM | POA: Diagnosis not present

## 2016-04-14 DIAGNOSIS — G629 Polyneuropathy, unspecified: Secondary | ICD-10-CM | POA: Diagnosis not present

## 2016-04-14 DIAGNOSIS — I10 Essential (primary) hypertension: Secondary | ICD-10-CM | POA: Diagnosis not present

## 2016-04-14 DIAGNOSIS — I4891 Unspecified atrial fibrillation: Secondary | ICD-10-CM | POA: Diagnosis not present

## 2016-04-14 DIAGNOSIS — M1990 Unspecified osteoarthritis, unspecified site: Secondary | ICD-10-CM | POA: Diagnosis not present

## 2016-04-15 DIAGNOSIS — I4891 Unspecified atrial fibrillation: Secondary | ICD-10-CM | POA: Diagnosis not present

## 2016-04-15 DIAGNOSIS — M1990 Unspecified osteoarthritis, unspecified site: Secondary | ICD-10-CM | POA: Diagnosis not present

## 2016-04-15 DIAGNOSIS — I10 Essential (primary) hypertension: Secondary | ICD-10-CM | POA: Diagnosis not present

## 2016-04-15 DIAGNOSIS — Z471 Aftercare following joint replacement surgery: Secondary | ICD-10-CM | POA: Diagnosis not present

## 2016-04-15 DIAGNOSIS — G629 Polyneuropathy, unspecified: Secondary | ICD-10-CM | POA: Diagnosis not present

## 2016-04-15 DIAGNOSIS — G3184 Mild cognitive impairment, so stated: Secondary | ICD-10-CM | POA: Diagnosis not present

## 2016-04-17 DIAGNOSIS — G629 Polyneuropathy, unspecified: Secondary | ICD-10-CM | POA: Diagnosis not present

## 2016-04-17 DIAGNOSIS — I4891 Unspecified atrial fibrillation: Secondary | ICD-10-CM | POA: Diagnosis not present

## 2016-04-17 DIAGNOSIS — M1990 Unspecified osteoarthritis, unspecified site: Secondary | ICD-10-CM | POA: Diagnosis not present

## 2016-04-17 DIAGNOSIS — G3184 Mild cognitive impairment, so stated: Secondary | ICD-10-CM | POA: Diagnosis not present

## 2016-04-17 DIAGNOSIS — Z471 Aftercare following joint replacement surgery: Secondary | ICD-10-CM | POA: Diagnosis not present

## 2016-04-17 DIAGNOSIS — I10 Essential (primary) hypertension: Secondary | ICD-10-CM | POA: Diagnosis not present

## 2016-04-19 DIAGNOSIS — Z471 Aftercare following joint replacement surgery: Secondary | ICD-10-CM | POA: Diagnosis not present

## 2016-04-19 DIAGNOSIS — I4891 Unspecified atrial fibrillation: Secondary | ICD-10-CM | POA: Diagnosis not present

## 2016-04-19 DIAGNOSIS — I10 Essential (primary) hypertension: Secondary | ICD-10-CM | POA: Diagnosis not present

## 2016-04-19 DIAGNOSIS — M1990 Unspecified osteoarthritis, unspecified site: Secondary | ICD-10-CM | POA: Diagnosis not present

## 2016-04-19 DIAGNOSIS — G3184 Mild cognitive impairment, so stated: Secondary | ICD-10-CM | POA: Diagnosis not present

## 2016-04-19 DIAGNOSIS — G629 Polyneuropathy, unspecified: Secondary | ICD-10-CM | POA: Diagnosis not present

## 2016-04-22 ENCOUNTER — Telehealth (HOSPITAL_COMMUNITY): Payer: Self-pay | Admitting: *Deleted

## 2016-04-22 DIAGNOSIS — M1712 Unilateral primary osteoarthritis, left knee: Secondary | ICD-10-CM | POA: Diagnosis not present

## 2016-04-22 NOTE — Telephone Encounter (Signed)
Pt daughter called in regarding recommended followup in afib clinic after recent hospitalization.  States her mother just had knee surgery and is following up with her PCP tomorrow and would prefer he monitor her at this point due to mobility issues.  Instructed daughter to ensure an EKG is performed since she recently started digoxin. She has appt to establish with Dr. Percival Spanish in early August. Daughter states she will call back and make appt in clinic if needed or recommended by Dr. Felipa Eth.

## 2016-04-23 DIAGNOSIS — Z96652 Presence of left artificial knee joint: Secondary | ICD-10-CM | POA: Diagnosis not present

## 2016-04-23 DIAGNOSIS — I10 Essential (primary) hypertension: Secondary | ICD-10-CM | POA: Diagnosis not present

## 2016-04-23 DIAGNOSIS — G2581 Restless legs syndrome: Secondary | ICD-10-CM | POA: Diagnosis not present

## 2016-04-23 DIAGNOSIS — I480 Paroxysmal atrial fibrillation: Secondary | ICD-10-CM | POA: Diagnosis not present

## 2016-04-23 DIAGNOSIS — M25562 Pain in left knee: Secondary | ICD-10-CM | POA: Diagnosis not present

## 2016-04-23 DIAGNOSIS — Z471 Aftercare following joint replacement surgery: Secondary | ICD-10-CM | POA: Diagnosis not present

## 2016-04-23 DIAGNOSIS — Z79899 Other long term (current) drug therapy: Secondary | ICD-10-CM | POA: Diagnosis not present

## 2016-04-26 DIAGNOSIS — M1712 Unilateral primary osteoarthritis, left knee: Secondary | ICD-10-CM | POA: Diagnosis not present

## 2016-04-29 DIAGNOSIS — M1712 Unilateral primary osteoarthritis, left knee: Secondary | ICD-10-CM | POA: Diagnosis not present

## 2016-05-02 ENCOUNTER — Telehealth: Payer: Self-pay

## 2016-05-02 NOTE — Patient Outreach (Signed)
Called Ms Moehring and Ms. Savoie gave the phone to her one of her daughters. She thought that a nurse was going to come out to see her mother. I explained that a Nurse will be give her a call it may be next week if not tomorrow, she said ok. I explained the program to Ms. Edd Fabian, Ms. Osterlund's daughter when they called in the referral.     By [facility]

## 2016-05-07 ENCOUNTER — Other Ambulatory Visit: Payer: Self-pay

## 2016-05-07 DIAGNOSIS — I4891 Unspecified atrial fibrillation: Secondary | ICD-10-CM

## 2016-05-07 NOTE — Patient Outreach (Signed)
[place] [facility]) Care Management  05/07/2016  Stacey Jefferson Nov 16, 1924 [id number]   REFERRAL DATE; 04/26/16 REFERRAL SOURCE:  Primary MD referral / nurse call line REFERRAL REASON:  Patient status post hip replacement, new medications, atrial fib, depression CONSENT: Patient gave verbal consent to speak with daughter, [name] regarding all of her medical information:  SUBJECTIVE: Telephone call to patient regarding primary MD referral. HIPAA verified with patient.  Patients daughter states patient is approximately 1 month out from left knee surgery.  Daughter states patient is up with fair difficulty. Daughter reports patient developed atrial fibrillation post operatively.  Daughter states patient is currently on two new medications, xeralto and digoxin.  Daughter states since starting the new medication patient has been extremely tired and lethargic.  Daughter states patient is hard to get out of bed. Daughter states she has held patients xeralto for the last 2 days to determine if its the xeralto that is making patient lethargic. RNCM inquired if patients doctor was aware of medication being held.  Daughter states she has not informed patients doctor of this. RNCM advised daughter to contact patients doctor as soon as possible to notify of patients change in status and holding of xeralto.  Daughter states patient had an EKG approximately 1 week ago and she was in normal sinus rhythm.   Daughter states patient has completed home physical therapy and has started outpatient physical therapy. Daughter states patient has been unable to go to outpatient therapy this week due to being so tired.  Daughter states patient has an appointment scheduled to see new cardiologist, Dr. Percival Spanish in August 2017.   Daughter states patient also has chronic irritable bowel syndrome.  Daughter states she and her sister assist patient with her care.  Daughter states patient has a health care aid daily  from 8am to 4pm due to daughter working during the day. Daughter states patient lives with her.  Daughter states patients orthopedic is Dr. Gladstone Lighter Daughter verbally agrees to [name] care management services for patient. Daughter request follow up by community case Freight forwarder.   ASSESSMENT: Primary MD referral.  Patient will benefit from community case management follow up.    PLAN:  RNCM will refer patient to community case manager.   Quinn Plowman RN,BSN,CCM [facility] Telephonic  [phone]

## 2016-05-09 DIAGNOSIS — N3000 Acute cystitis without hematuria: Secondary | ICD-10-CM | POA: Diagnosis not present

## 2016-05-09 DIAGNOSIS — R410 Disorientation, unspecified: Secondary | ICD-10-CM | POA: Diagnosis not present

## 2016-05-09 DIAGNOSIS — I959 Hypotension, unspecified: Secondary | ICD-10-CM | POA: Diagnosis not present

## 2016-05-10 ENCOUNTER — Other Ambulatory Visit: Payer: Self-pay | Admitting: *Deleted

## 2016-05-10 ENCOUNTER — Encounter: Payer: Self-pay | Admitting: *Deleted

## 2016-05-10 NOTE — Patient Outreach (Signed)
[facility]) Care Management  05/10/2016  Stacey Jefferson [birth year] [id number]  RN spoke with pt and her caregiver daughter today. Pt provided permission to speak with her daughter and was able to answer some of the initial assessment questions. Daughter states the current issues and further discussed what the pt may need. RN provided information on community resources for Adult Daycare (PACE),  Transportation if needed, Silver sneakers however this is not conducive for this pt at this time. Caregiver inquired on someone to take pt places to entertain her days. RN further explained the Adult Daycare services and what may be offered however other options would be for hired help in the home willing to extend such services based their own criteria for just services (daughter with understanding). RN will further extended Mobile meals and CHRP for both or medication services however declined at this time indicating she lives with the pt and is responsible for managing her pillbox with the pt's daily medications.  State recent diagnosis of a UTI pt was lethargic and her BP was low. Pt provided treatment for her UTI for the next 7 days and pt's BP medication placed on HOLD until pt's BP returns to her normal baseline read of 120/80.Strongly encouraged adherence to this regimen.  Pt states in the background "Today is a good day". No pain or issues related as pt feeling good today. Daughter states she works 4 days out of the week and pt gets up late. Based upon accommodating pt's and caregiver schedule will schedule accordingly. Both parties willing to complete the initial assessment today over the phone and both receptive to the schedule home visit appointment for this month. Plan of care was discussed with goals generated for this pt. Will follow up accordingly and re-evaluate on the next home visit. RN proved RN contact number and name if services are needed prior to the scheduled appointment.    Raina Mina, RN Care Management Coordinator [place] Office [phone]

## 2016-05-21 ENCOUNTER — Other Ambulatory Visit: Payer: Self-pay | Admitting: *Deleted

## 2016-05-21 ENCOUNTER — Encounter: Payer: Self-pay | Admitting: *Deleted

## 2016-05-21 NOTE — Patient Outreach (Addendum)
[place] [facility]) Care Management   05/21/2016  Stacey Jefferson [birth year] [id number]  Stacey Jefferson is an 82 y.o. female  Subjective:  THN: pt remains interested and signed the [facility] consent from for ongoing enrollment today. ATRIAL FIBILLATION: Pt not familar with Atrial Fibrillation however receptive to the teaching and printed material.   Pt discussed other medical conditions today and remains receptive to education on her Atrial Fibilllation.  MEDICATIONS:Pt also confirmed she takes all her medications that are perpared by her daughter [name]). Pt mentioned a caregiver throughout the day along with her daily choices. Daughter reports pt has completed her round of antibiotics with no additional signs of "lethargic" related to a recent UTI.  MEDICAL APPOINTMENTS: Both pt and her daughter verified pt has attended all appointments.  Objective:   Review of Systems  Constitutional: Negative.   HENT: Negative.   Eyes: Negative.   Respiratory: Negative.   Cardiovascular: Negative.   Gastrointestinal: Negative.   Genitourinary: Negative.   Musculoskeletal: Negative.   Skin: Negative.   Neurological: Negative.   Endo/Heme/Allergies: Negative.   Psychiatric/Behavioral: Negative.     Physical Exam  Constitutional: She is oriented to person, place, and time. She appears well-developed and well-nourished.  HENT:  Right Ear: External ear normal.  Left Ear: External ear normal.  Eyes: EOM are normal.  Neck: Normal range of motion.  Cardiovascular: Normal heart sounds.   Respiratory: Effort normal and breath sounds normal.  GI: Soft. Bowel sounds are normal.  Musculoskeletal: Normal range of motion.  Neurological: She is alert and oriented to person, place, and time.  Skin: Skin is warm and dry.  Psychiatric: She has a normal mood and affect. Her behavior is normal. Judgment and thought content normal.    Encounter Medications:   Outpatient Encounter Prescriptions as of  05/21/2016  Medication Sig Note  . chlorhexidine (PERIDEX) 0.12 % solution Use as directed 15 mLs in the mouth or throat daily.    . clonazePAM (KLONOPIN) 0.5 MG tablet Take 1 tablet (0.5 mg total) by mouth at bedtime as needed. (Patient taking differently: Take 0.5 mg by mouth at bedtime. )   . Cyanocobalamin (VITAMIN B 12 PO) Take 1 tablet by mouth daily.    . digoxin 62.5 MCG TABS Take 0.0625 mg by mouth daily.   Marland Kitchen donepezil (ARICEPT) 10 MG tablet Take 1 tablet (10 mg total) by mouth at bedtime.   Marland Kitchen liver oil-zinc oxide (DESITIN) 40 % ointment Apply 1 application topically as needed for irritation (to bottom after each BM-using a generic ' Butt paste w zinc").   . loperamide (IMODIUM) 1 MG/5ML solution Take 2 mg by mouth as needed for diarrhea or loose stools.   Marland Kitchen loratadine (CLARITIN) 10 MG tablet Take 10 mg by mouth daily.   . methocarbamol (ROBAXIN) 500 MG tablet Take 1 tablet (500 mg total) by mouth every 6 (six) hours as needed for muscle spasms.   Marland Kitchen omeprazole (PRILOSEC) 20 MG capsule Take 20 mg by mouth 2 (two) times daily. Opens capsule and mixes in with food or water   . Polyethyl Glycol-Propyl Glycol (SYSTANE OP) Place 1 drop into both eyes 3 (three) times daily.  03/18/2016: She uses on a daily basis.  . rivaroxaban (XARELTO) 15 MG TABS tablet Take 1 tablet (15 mg total) by mouth daily with breakfast.   . Salicylic Acid (SALACYN) 6 % LOTN Apply 1 application topically as needed (for legs).    . Wheat Dextrin (BENEFIBER) POWD Take  10 mLs by mouth 2 (two) times daily.   . bisoprolol (ZEBETA) 5 MG tablet Take 5 mg by mouth at bedtime. Reported on 05/10/2016   . carbidopa-levodopa (SINEMET) 25-100 MG per tablet Take 1 tablet by mouth 3 (three) times daily. (Patient not taking: Reported on 05/10/2016)   . ciprofloxacin (CIPRO) 500 MG tablet Take 500 mg by mouth 2 (two) times daily. Take for two weeks   . HYDROcodone-acetaminophen (NORCO) 7.5-325 MG tablet Take 1 tablet by mouth every 4 (four)  hours as needed for moderate pain or severe pain. (Patient not taking: Reported on 05/10/2016)    No facility-administered encounter medications on file as of 05/21/2016.     Functional Status:   In your present state of health, do you have any difficulty performing the following activities: 05/10/2016 04/09/2016  Hearing? Tempie Donning  Vision? Y N  Difficulty concentrating or making decisions? Tempie Donning  Walking or climbing stairs? Y N  Dressing or bathing? Y Y  Doing errands, shopping? Y N  Preparing Food and eating ? Y -  Using the Toilet? Y -  In the past six months, have you accidently leaked urine? Y -  Do you have problems with loss of bowel control? Y -  Managing your Medications? Y -  Managing your Finances? Y -  Housekeeping or managing your Housekeeping? Y -  Some recent data might be hidden   BP 128/62 (BP Location: Right Arm, Patient Position: Sitting, Cuff Size: Normal)   Resp 20   Ht 1.549 m (5\' 1" )   Wt 140 lb (63.5 kg)   BMI 26.45 kg/m  Fall/Depression Screening:    PHQ 2/9 Scores 05/10/2016  PHQ - 2 Score 0    Assessment:   Introduction to the [facility] program and services for enrollment. Case management related to Atrial Fibrillation Follow up on recent symptoms related to UTI   Plan:  Will confirm pt's interest and obtained a signed consent form for [facility] services and enrollment into a program. Will educate and discuss printed material via EMMI provided to pt today on the following: Atrial Fibrillation and the risk of stroke and Living with Atrial Fibrillation. Verified pt's understanding in the presence of her daughter [name]). Note caregiver present during today's home visit with pt's approval.  Will confirm pt has finished the antibiotic regimen for her UTI with no additional symptoms of lethargic. Will encouraged daughter to contact pt's provider immediately with any additional symptoms that maybe related to an ongoing UTI. Also encouraged the pt to reports such symptoms to her  daughter.  Plan of care discussed and goals for future evaluations. Will strongly encouraged pt to adhere to the plan agreed upon. Will notify pt's primary of pt's involvement along with the plan of care.       Raina Mina, RN Care Management Coordinator [place] Office [phone]

## 2016-05-29 ENCOUNTER — Encounter: Payer: Self-pay | Admitting: *Deleted

## 2016-05-29 NOTE — Telephone Encounter (Signed)
This encounter was created in error - please disregard.  This encounter was created in error - please disregard.

## 2016-05-29 NOTE — Progress Notes (Signed)
Cardiology Office Note   Date:  05/30/2016   ID:  Stacey Jefferson, DOB 1925/05/30, MRN [id number]  PCP:  Mathews Argyle, MD  Cardiologist:   Minus Breeding, MD   Chief Complaint  Patient presents with  . Atrial Fibrillation      History of Present Illness: Stacey Jefferson is a 82 y.o. female who presents for follow up of atrial fib.  She was noted to have this when she was in the hospital with TKR in June.  She was treated with Xarelto and rate control.    She had an echocardiogram at that time demonstrated some left ventricular hypertrophy but was otherwise relatively unremarkable. She comes back for follow-up. She was daughter and ambulates with a walker. She is fairly limited. She's had some increased left lower leg swelling. She hasn't noticed any palpitations, presyncope or syncope. She has had no new SOB.  Her biggest complaint has been anxiety.  Of note her BP was low since discharge and bisoprolol was discontinued.    Past Medical History:  Diagnosis Date  . Abnormality of gait    uses rollator and walker at home for ambulation  . Arthritis   . Atrial fibrillation ([place])   . Cancer ([place])    skin cancer left cheek with Mohrs  . Carpal tunnel syndrome   . Dysphagia, unspecified(787.20)   . Eyelid abnormality    left eyelid droops(common)  . GERD (gastroesophageal reflux disease)   . HOH (hard of hearing)    bilateral hearing aids  . Hx of seasonal allergies   . Hypertension   . Hypotension, unspecified   . Irritable bowel syndrome   . Memory loss   . Polyneuropathy in other diseases classified elsewhere ([place])   . Restless legs syndrome (RLS)     Past Surgical History:  Procedure Laterality Date  . ABDOMINAL HYSTERECTOMY    . BLADDER ASPIRATION    . CARPAL TUNNEL RELEASE Right   . EYE SURGERY     bilateral cataract surgery with lens implants  . mouth implants  1991  . SQUAMOUS CELL CARCINOMA EXCISION    . TOTAL KNEE ARTHROPLASTY Bilateral 05/10/2015   Procedure: TOTAL RIGHT  KNEE ARTHROPLASTY ;   LEFT KNEE STEROID INJECTION ;  Surgeon: Latanya Maudlin, MD;  Location: [facility];  Service: Orthopedics;  Laterality: Bilateral;  . TOTAL KNEE ARTHROPLASTY Left 04/09/2016   Procedure: LEFT TOTAL KNEE ARTHROPLASTY;  Surgeon: Latanya Maudlin, MD;  Location: [facility];  Service: Orthopedics;  Laterality: Left;     Current Outpatient Prescriptions  Medication Sig Dispense Refill  . chlorhexidine (PERIDEX) 0.12 % solution Use as directed 15 mLs in the mouth or throat daily.   0  . clonazePAM (KLONOPIN) 0.5 MG tablet Take 1 tablet (0.5 mg total) by mouth at bedtime as needed. (Patient taking differently: Take 0.5 mg by mouth at bedtime. ) 90 tablet 3  . Cyanocobalamin (VITAMIN B 12 PO) Take 1 tablet by mouth daily.     . digoxin 62.5 MCG TABS Take 0.0625 mg by mouth daily. 30 tablet 0  . donepezil (ARICEPT) 10 MG tablet Take 1 tablet (10 mg total) by mouth at bedtime. 90 tablet 3  . liver oil-zinc oxide (DESITIN) 40 % ointment Apply 1 application topically as needed for irritation (to bottom after each BM-using a generic ' Butt paste w zinc").    . loperamide (IMODIUM) 1 MG/5ML solution Take 2 mg by mouth as needed for diarrhea or loose stools.    Marland Kitchen  loratadine (CLARITIN) 10 MG tablet Take 10 mg by mouth daily.    Marland Kitchen MELATONIN ER PO Take 1 tablet by mouth at bedtime.    . methocarbamol (ROBAXIN) 500 MG tablet Take 1 tablet (500 mg total) by mouth every 6 (six) hours as needed for muscle spasms. 60 tablet 0  . omeprazole (PRILOSEC) 20 MG capsule Take 20 mg by mouth 2 (two) times daily. Opens capsule and mixes in with food or water    . Polyethyl Glycol-Propyl Glycol (SYSTANE OP) Place 1 drop into both eyes 3 (three) times daily.     . rivaroxaban (XARELTO) 15 MG TABS tablet Take 1 tablet (15 mg total) by mouth daily with breakfast. 30 tablet 11  . Salicylic Acid (SALACYN) 6 % LOTN Apply 1 application topically as needed (for legs).     . Wheat Dextrin (BENEFIBER)  POWD Take 10 mLs by mouth 2 (two) times daily.     No current facility-administered medications for this visit.     Allergies:   Clarithromycin; Diltiazem; and Sulfa antibiotics    ROS:  Please see the history of present illness.   Otherwise, review of systems are positive for none.   All other systems are reviewed and negative.    PHYSICAL EXAM: VS:  BP 129/71   Pulse 75   Ht 5\' 2"  (1.575 m)   Wt 152 lb (68.9 kg)   BMI 27.80 kg/m  , BMI Body mass index is 27.8 kg/m. GEN:  No distress, frail NECK:  No jugular venous distention at 90 degrees, waveform within normal limits, carotid upstroke brisk and symmetric, no bruits, no thyromegaly LYMPHATICS:  No cervical adenopathy LUNGS:  Clear to auscultation bilaterally BACK:  No CVA tenderness CHEST:  Unremarkable HEART:  S1 and S2 within normal limits, no S3, no S4, no clicks, no rubs, no murmurs ABD:  Positive bowel sounds normal in frequency in pitch, no bruits, no rebound, no guarding, unable to assess midline mass or bruit with the patient seated. EXT:  2 plus pulses throughout, moderate edema left leg, no cyanosis no clubbing SKIN:  No rashes no nodules NEURO:  Cranial nerves II through XII grossly intact, motor grossly intact throughout PSYCH:  Cognitively intact, oriented to person place and time   EKG:  EKG is not ordered today.   Recent Labs: 04/10/2016: TSH 0.899 04/11/2016: BUN 9; Creatinine, Ser 0.70; Potassium 3.5; Sodium 139 04/12/2016: Hemoglobin 10.0; Platelets 109    Lipid Panel No results found for: CHOL, TRIG, HDL, CHOLHDL, VLDL, LDLCALC, LDLDIRECT    Wt Readings from Last 3 Encounters:  05/30/16 152 lb (68.9 kg)  05/21/16 140 lb (63.5 kg)  05/10/16 136 lb (61.7 kg)      Other studies Reviewed: Additional studies/ records that were reviewed today include:  Hospital records . Review of the above records demonstrates:  Please see elsewhere in the note.     ASSESSMENT AND PLAN:  ATRIAL FIB:  Ms. Stacey Jefferson has a CHA2DS2 - VASc score of 4 with a risk of stroke of 4%.  For now I'm going to continue her anticoagulant. However, she might not need this long-term if I don't see further evidence fibrillation as this may have only been associated with her surgery and acute illness.  HYPOTENSION:   Her blood pressure starting to trend up a little bit. Her daughter watched carefully. She'll let me know if she needs reinstitution of any of her medications.  LEG SWELLING:  I think it would  be unusual for her to have a DVT on her medication but she is on a lower dose.  I will check a venous Doppler.  Current medicines are reviewed at length with the patient today.  The patient does not have concerns regarding medicines.  The following changes have been made:  no change  Labs/ tests ordered today include:  No orders of the defined types were placed in this encounter.    Disposition:   FU with me in 4 months.     Signed, Minus Breeding, MD  05/30/2016 5:33 PM    [place]

## 2016-05-30 ENCOUNTER — Encounter: Payer: Self-pay | Admitting: Cardiology

## 2016-05-30 ENCOUNTER — Ambulatory Visit (INDEPENDENT_AMBULATORY_CARE_PROVIDER_SITE_OTHER): Payer: Medicare Other | Admitting: Cardiology

## 2016-05-30 ENCOUNTER — Other Ambulatory Visit: Payer: Self-pay | Admitting: Cardiology

## 2016-05-30 ENCOUNTER — Ambulatory Visit (HOSPITAL_COMMUNITY)
Admission: RE | Admit: 2016-05-30 | Discharge: 2016-05-30 | Disposition: A | Discharge status: Home / Self Care | Payer: Medicare Other | Source: Ambulatory Visit | Attending: Cardiology | Admitting: Cardiology

## 2016-05-30 VITALS — BP 129/71 | HR 75 | Ht 62.0 in | Wt 152.0 lb

## 2016-05-30 DIAGNOSIS — I480 Paroxysmal atrial fibrillation: Secondary | ICD-10-CM | POA: Diagnosis not present

## 2016-05-30 DIAGNOSIS — M7989 Other specified soft tissue disorders: Secondary | ICD-10-CM | POA: Diagnosis not present

## 2016-05-30 DIAGNOSIS — I10 Essential (primary) hypertension: Secondary | ICD-10-CM | POA: Diagnosis not present

## 2016-05-30 DIAGNOSIS — K219 Gastro-esophageal reflux disease without esophagitis: Secondary | ICD-10-CM | POA: Insufficient documentation

## 2016-05-30 NOTE — Patient Instructions (Addendum)
Medication Instructions:  Continue current medications  Labwork: NONE  Testing/Procedures: Your physician has requested that you have a lower extremity venous duplex. This test is an ultrasound of the veins in the legs or arms. It looks at venous blood flow that carries blood from the heart to the legs or arms. Allow one hour for a Lower Venous exam. Allow thirty minutes for an Upper Venous exam. There are no restrictions or special instructions.  Follow-Up: Your physician wants you to follow-up in: 4 Months. You will receive a reminder letter in the mail two months in advance. If you don't receive a letter, please call our office to schedule the follow-up appointment.   Any Other Special Instructions Will Be Listed Below (If Applicable).   If you need a refill on your cardiac medications before your next appointment, please call your pharmacy.

## 2016-06-03 DIAGNOSIS — Z4789 Encounter for other orthopedic aftercare: Secondary | ICD-10-CM | POA: Diagnosis not present

## 2016-06-04 ENCOUNTER — Other Ambulatory Visit: Payer: Self-pay | Admitting: Neurology

## 2016-06-04 NOTE — Telephone Encounter (Signed)
Rx sent with note stating that patient needs an appointment.

## 2016-06-18 ENCOUNTER — Ambulatory Visit: Payer: Medicare Other | Admitting: *Deleted

## 2016-06-24 ENCOUNTER — Other Ambulatory Visit: Payer: Self-pay | Admitting: *Deleted

## 2016-06-24 NOTE — Patient Outreach (Signed)
[place] [facility]) Care Management   06/24/2016  Stacey Jefferson 1925-01-22 [id number]  Stacey Jefferson is an 82 y.o. female  Subjective:  ATRIAL FIBRILLATION: Pt denies any symptoms of atrial fibrillation and states she has been doing well with no encountered problems. Pt has consult with her CAD provider Dr. Percival Spanish earlier this month with no reported issues.  Pt is aware to notify one of her providers if she experiences any symptoms related to her atrial fibrillation. MEDICAL APPOINTMENTS: Pt reports attending all her scheduled medical appointments other then her out patient PT appointments due to her flare ups with her(IBS) irritable bowel syndrome but continues to make progress with home exercises.  MEDICATIONS: Pt verified she has completed all UTI medication and taking all her medications as provided by her daughter [name] (primary caregiver who lives in the home). No needed refills at this time. SAFETY: Pt states she has not been able to attend her out patient appointment due to her IBS but has been ambulating within her home and using hand weights to continue building her strength. Denies any falls or injuries since RN last home visit.   Objective:   Review of Systems  Constitutional: Negative.   HENT: Negative.   Eyes: Negative.   Respiratory: Negative.   Cardiovascular: Negative.   Gastrointestinal: Negative.   Genitourinary: Negative.   Musculoskeletal: Negative.   Skin: Negative.   Neurological: Negative.   Endo/Heme/Allergies: Negative.   Psychiatric/Behavioral: Negative.     Physical Exam  Constitutional: She is oriented to person, place, and time. She appears well-developed and well-nourished.  Eyes: EOM are normal.  Neck: Normal range of motion.  Respiratory: Effort normal and breath sounds normal.  GI: Soft. Bowel sounds are normal.  Musculoskeletal: Normal range of motion.  Neurological: She is alert and oriented to person, place, and time.  Skin: Skin is  warm and dry.  Psychiatric: She has a normal mood and affect. Her behavior is normal. Judgment and thought content normal.    Encounter Medications:   Outpatient Encounter Prescriptions as of 06/24/2016  Medication Sig Note  . chlorhexidine (PERIDEX) 0.12 % solution Use as directed 15 mLs in the mouth or throat daily.    . clonazePAM (KLONOPIN) 0.5 MG tablet Take 1 tablet (0.5 mg total) by mouth at bedtime as needed. (Patient taking differently: Take 0.5 mg by mouth at bedtime. )   . Cyanocobalamin (VITAMIN B 12 PO) Take 1 tablet by mouth daily.    . digoxin 62.5 MCG TABS Take 0.0625 mg by mouth daily.   Marland Kitchen donepezil (ARICEPT) 10 MG tablet TAKE 1 TABLET BY MOUTH AT BEDTIME   . liver oil-zinc oxide (DESITIN) 40 % ointment Apply 1 application topically as needed for irritation (to bottom after each BM-using a generic ' Butt paste w zinc").   . loperamide (IMODIUM) 1 MG/5ML solution Take 2 mg by mouth as needed for diarrhea or loose stools.   Marland Kitchen loratadine (CLARITIN) 10 MG tablet Take 10 mg by mouth daily.   Marland Kitchen MELATONIN ER PO Take 1 tablet by mouth at bedtime.   . methocarbamol (ROBAXIN) 500 MG tablet Take 1 tablet (500 mg total) by mouth every 6 (six) hours as needed for muscle spasms.   Marland Kitchen omeprazole (PRILOSEC) 20 MG capsule Take 20 mg by mouth 2 (two) times daily. Opens capsule and mixes in with food or water   . Polyethyl Glycol-Propyl Glycol (SYSTANE OP) Place 1 drop into both eyes 3 (three) times daily.  03/18/2016:  She uses on a daily basis.  . rivaroxaban (XARELTO) 15 MG TABS tablet Take 1 tablet (15 mg total) by mouth daily with breakfast.   . Salicylic Acid (SALACYN) 6 % LOTN Apply 1 application topically as needed (for legs).    . Wheat Dextrin (BENEFIBER) POWD Take 10 mLs by mouth 2 (two) times daily.    No facility-administered encounter medications on file as of 06/24/2016.     Functional Status:   In your present state of health, do you have any difficulty performing the following  activities: 05/10/2016 04/09/2016  Hearing? Tempie Donning  Vision? Y N  Difficulty concentrating or making decisions? Tempie Donning  Walking or climbing stairs? Y N  Dressing or bathing? Y Y  Doing errands, shopping? Y N  Preparing Food and eating ? Y -  Using the Toilet? Y -  In the past six months, have you accidently leaked urine? Y -  Do you have problems with loss of bowel control? Y -  Managing your Medications? Y -  Managing your Finances? Y -  Housekeeping or managing your Housekeeping? Y -  Some recent data might be hidden   BP 120/60 (BP Location: Right Arm, Patient Position: Sitting, Cuff Size: Normal)   Pulse 74   Resp 20   Ht 1.524 m (5')   Wt 140 lb (63.5 kg)   SpO2 98%   BMI 27.34 kg/m  Fall/Depression Screening:    PHQ 2/9 Scores 05/10/2016  PHQ - 2 Score 0    Assessment:   Ongoing case management related to Atrial Fibrillation Follow up on adherence related to medical appointments Follow up on adherence related to prescribed medication Follow up on ongoing out patient PT.  Plan:  Will verify pt continues to do well with no issues encountered. Will verify pt has not encountered any symptoms of Atrial Fibrillation or increased HR. Pt with short term memory but aware to inform one of caregivers if she encounters any symptoms or signs. Will review the EMMI information and symptoms provided last home visit and encouraged pt to review with her caregivers.  Will verify pt has attended all medical appointments with sufficient transportation. No resources needed related to transportation. Will verify pt continues to administer her medications as prescribed. Verified pt has enough supples as verified pt's daughter Nancy/Particia. Will continue to encouraged mobility and again offer any needed resources to assist pt further in accomplishing her in meeting her goals.  Plan of care discussed along with all goals some met and long term will be re-evaluated on next home visit in September.  Raina Mina, RN Care Management Coordinator [place] Office [phone]

## 2016-07-15 DIAGNOSIS — M1712 Unilateral primary osteoarthritis, left knee: Secondary | ICD-10-CM | POA: Diagnosis not present

## 2016-07-15 DIAGNOSIS — Z471 Aftercare following joint replacement surgery: Secondary | ICD-10-CM | POA: Diagnosis not present

## 2016-07-15 DIAGNOSIS — Z96652 Presence of left artificial knee joint: Secondary | ICD-10-CM | POA: Diagnosis not present

## 2016-07-16 DIAGNOSIS — M25571 Pain in right ankle and joints of right foot: Secondary | ICD-10-CM | POA: Diagnosis not present

## 2016-07-16 DIAGNOSIS — M71571 Other bursitis, not elsewhere classified, right ankle and foot: Secondary | ICD-10-CM | POA: Diagnosis not present

## 2016-07-16 DIAGNOSIS — M792 Neuralgia and neuritis, unspecified: Secondary | ICD-10-CM | POA: Diagnosis not present

## 2016-07-16 DIAGNOSIS — M19071 Primary osteoarthritis, right ankle and foot: Secondary | ICD-10-CM | POA: Diagnosis not present

## 2016-07-23 DIAGNOSIS — R269 Unspecified abnormalities of gait and mobility: Secondary | ICD-10-CM | POA: Diagnosis not present

## 2016-07-23 DIAGNOSIS — M2041 Other hammer toe(s) (acquired), right foot: Secondary | ICD-10-CM | POA: Diagnosis not present

## 2016-07-23 DIAGNOSIS — M71571 Other bursitis, not elsewhere classified, right ankle and foot: Secondary | ICD-10-CM | POA: Diagnosis not present

## 2016-07-25 ENCOUNTER — Encounter: Payer: Self-pay | Admitting: *Deleted

## 2016-07-25 ENCOUNTER — Other Ambulatory Visit: Payer: Self-pay | Admitting: *Deleted

## 2016-07-25 NOTE — Patient Outreach (Signed)
[place] [facility]) Care Management   07/25/2016  Stacey Jefferson [birth year] [id number]  Stacey Jefferson is an 82 y.o. female  Subjective:  Atrial Fibrillation: Pt reports her memory is short term but appreciative of the educational material and home visits that have been provided. Pt verified no issues or events have occurred over the last month related to her heart (Atrial Fibrillation) with support from her caregivers and family (daughter). TRANSPORTATION: Pt has indicated she has transportation but always interested in additional resources if ever needed in the future.   Objective:   Review of Systems  All other systems reviewed and are negative.   Physical Exam  Constitutional: She is oriented to person, place, and time. She appears well-nourished.  HENT:  Right Ear: External ear normal.  Left Ear: External ear normal.  Hearing aids  Eyes: EOM are normal.  Neck: Normal range of motion.  Cardiovascular: Normal heart sounds.   Respiratory: Effort normal and breath sounds normal.  GI: Soft. Bowel sounds are normal.  Musculoskeletal: Normal range of motion.  Neurological: She is alert and oriented to person, place, and time.  Skin: Skin is warm and dry.  Psychiatric: She has a normal mood and affect. Her behavior is normal. Judgment and thought content normal.    Encounter Medications:   Outpatient Encounter Prescriptions as of 07/25/2016  Medication Sig Note  . chlorhexidine (PERIDEX) 0.12 % solution Use as directed 15 mLs in the mouth or throat daily.    . clonazePAM (KLONOPIN) 0.5 MG tablet Take 1 tablet (0.5 mg total) by mouth at bedtime as needed. (Patient taking differently: Take 0.5 mg by mouth at bedtime. )   . Cyanocobalamin (VITAMIN B 12 PO) Take 1 tablet by mouth daily.    . digoxin 62.5 MCG TABS Take 0.0625 mg by mouth daily.   Marland Kitchen donepezil (ARICEPT) 10 MG tablet TAKE 1 TABLET BY MOUTH AT BEDTIME   . liver oil-zinc oxide (DESITIN) 40 % ointment Apply 1  application topically as needed for irritation (to bottom after each BM-using a generic ' Butt paste w zinc").   . loperamide (IMODIUM) 1 MG/5ML solution Take 2 mg by mouth as needed for diarrhea or loose stools.   Marland Kitchen loratadine (CLARITIN) 10 MG tablet Take 10 mg by mouth daily.   Marland Kitchen MELATONIN ER PO Take 1 tablet by mouth at bedtime.   . methocarbamol (ROBAXIN) 500 MG tablet Take 1 tablet (500 mg total) by mouth every 6 (six) hours as needed for muscle spasms.   Marland Kitchen omeprazole (PRILOSEC) 20 MG capsule Take 20 mg by mouth 2 (two) times daily. Opens capsule and mixes in with food or water   . Polyethyl Glycol-Propyl Glycol (SYSTANE OP) Place 1 drop into both eyes 3 (three) times daily.  03/18/2016: She uses on a daily basis.  . rivaroxaban (XARELTO) 15 MG TABS tablet Take 1 tablet (15 mg total) by mouth daily with breakfast.   . Salicylic Acid (SALACYN) 6 % LOTN Apply 1 application topically as needed (for legs).    . Wheat Dextrin (BENEFIBER) POWD Take 10 mLs by mouth 2 (two) times daily.    No facility-administered encounter medications on file as of 07/25/2016.     Functional Status:   In your present state of health, do you have any difficulty performing the following activities: 05/10/2016 04/09/2016  Hearing? Tempie Donning  Vision? Y N  Difficulty concentrating or making decisions? Tempie Donning  Walking or climbing stairs? Y N  Dressing or  bathing? Y Y  Doing errands, shopping? Y N  Preparing Food and eating ? Y -  Using the Toilet? Y -  In the past six months, have you accidently leaked urine? Y -  Do you have problems with loss of bowel control? Y -  Managing your Medications? Y -  Managing your Finances? Y -  Housekeeping or managing your Housekeeping? Y -  Some recent data might be hidden    Fall/Depression Screening:    PHQ 2/9 Scores 05/10/2016  PHQ - 2 Score 0   BP (!) 116/56 (BP Location: Left Arm, Patient Position: Sitting, Cuff Size: Normal)   Pulse 74   Resp 18   Ht 1.524 m (5')   Wt 135  lb (61.2 kg)   SpO2 96%   BMI 26.37 kg/m  Assessment:   Ongoing case management related to Atrial Fibrillation Community resources related to transportation   Plan:  Will completed a physical assessment and review pt's plan of care and goals. Pt with some memory of atrial fibrillation and verified she has not experienced any abnormal beats or heart related issues. Pt has been provided educational information over the last 3 months and aware when to notify one of her caregivers if any symptoms are encountered. Will provide additional resources for transportation if needed in the future. Will provide a [facility] calendar with additional community resources available for outreach if needed by family or caregivers.  Based upon pt's progress discharge planning discussed last month if pt continued to do well. Verified pt has several caregivers and aides services in the home throughout the day. Will update Dr. Felipa Eth that pt will be discharged via [facility] program and services.   Case will be closed with all goals met. Will alert pt if [facility] services or additional resources are needed to contact this RN directly or the [facility] office with a provided contact number.    Raina Mina, RN Care Management Coordinator [place] Office [phone]

## 2016-07-30 DIAGNOSIS — Z23 Encounter for immunization: Secondary | ICD-10-CM | POA: Diagnosis not present

## 2016-07-30 DIAGNOSIS — L309 Dermatitis, unspecified: Secondary | ICD-10-CM | POA: Diagnosis not present

## 2016-07-30 DIAGNOSIS — L2489 Irritant contact dermatitis due to other agents: Secondary | ICD-10-CM | POA: Diagnosis not present

## 2016-07-30 DIAGNOSIS — L089 Local infection of the skin and subcutaneous tissue, unspecified: Secondary | ICD-10-CM | POA: Diagnosis not present

## 2016-07-30 DIAGNOSIS — B379 Candidiasis, unspecified: Secondary | ICD-10-CM | POA: Diagnosis not present

## 2016-08-02 ENCOUNTER — Other Ambulatory Visit: Payer: Self-pay | Admitting: Neurology

## 2016-08-05 NOTE — Telephone Encounter (Signed)
Refused RX request: Patient needs appointment.

## 2016-08-08 DIAGNOSIS — H1851 Endothelial corneal dystrophy: Secondary | ICD-10-CM | POA: Diagnosis not present

## 2016-08-08 DIAGNOSIS — H01029 Squamous blepharitis unspecified eye, unspecified eyelid: Secondary | ICD-10-CM | POA: Diagnosis not present

## 2016-08-08 DIAGNOSIS — Z961 Presence of intraocular lens: Secondary | ICD-10-CM | POA: Diagnosis not present

## 2016-08-08 DIAGNOSIS — B308 Other viral conjunctivitis: Secondary | ICD-10-CM | POA: Diagnosis not present

## 2016-08-13 DIAGNOSIS — B958 Unspecified staphylococcus as the cause of diseases classified elsewhere: Secondary | ICD-10-CM | POA: Diagnosis not present

## 2016-08-13 DIAGNOSIS — L570 Actinic keratosis: Secondary | ICD-10-CM | POA: Diagnosis not present

## 2016-08-13 DIAGNOSIS — B379 Candidiasis, unspecified: Secondary | ICD-10-CM | POA: Diagnosis not present

## 2016-10-29 DIAGNOSIS — Z79899 Other long term (current) drug therapy: Secondary | ICD-10-CM | POA: Diagnosis not present

## 2016-10-29 DIAGNOSIS — L89151 Pressure ulcer of sacral region, stage 1: Secondary | ICD-10-CM | POA: Diagnosis not present

## 2016-10-29 DIAGNOSIS — G301 Alzheimer's disease with late onset: Secondary | ICD-10-CM | POA: Diagnosis not present

## 2016-10-29 DIAGNOSIS — I480 Paroxysmal atrial fibrillation: Secondary | ICD-10-CM | POA: Diagnosis not present

## 2016-10-29 DIAGNOSIS — I10 Essential (primary) hypertension: Secondary | ICD-10-CM | POA: Diagnosis not present

## 2017-02-04 DIAGNOSIS — R197 Diarrhea, unspecified: Secondary | ICD-10-CM | POA: Diagnosis not present

## 2017-02-04 DIAGNOSIS — D2371 Other benign neoplasm of skin of right lower limb, including hip: Secondary | ICD-10-CM | POA: Diagnosis not present

## 2017-02-04 DIAGNOSIS — M2570 Osteophyte, unspecified joint: Secondary | ICD-10-CM | POA: Diagnosis not present

## 2017-02-05 DIAGNOSIS — R197 Diarrhea, unspecified: Secondary | ICD-10-CM | POA: Diagnosis not present

## 2017-02-18 DIAGNOSIS — R197 Diarrhea, unspecified: Secondary | ICD-10-CM | POA: Diagnosis not present

## 2017-02-19 DIAGNOSIS — R197 Diarrhea, unspecified: Secondary | ICD-10-CM | POA: Diagnosis not present

## 2017-02-19 DIAGNOSIS — L22 Diaper dermatitis: Secondary | ICD-10-CM | POA: Diagnosis not present

## 2017-02-19 DIAGNOSIS — L909 Atrophic disorder of skin, unspecified: Secondary | ICD-10-CM | POA: Diagnosis not present

## 2017-03-11 DIAGNOSIS — Z961 Presence of intraocular lens: Secondary | ICD-10-CM | POA: Diagnosis not present

## 2017-03-11 DIAGNOSIS — B308 Other viral conjunctivitis: Secondary | ICD-10-CM | POA: Diagnosis not present

## 2017-03-11 DIAGNOSIS — H1851 Endothelial corneal dystrophy: Secondary | ICD-10-CM | POA: Diagnosis not present

## 2017-03-11 DIAGNOSIS — H01029 Squamous blepharitis unspecified eye, unspecified eyelid: Secondary | ICD-10-CM | POA: Diagnosis not present

## 2017-04-01 DIAGNOSIS — F322 Major depressive disorder, single episode, severe without psychotic features: Secondary | ICD-10-CM | POA: Diagnosis not present

## 2017-04-01 DIAGNOSIS — G301 Alzheimer's disease with late onset: Secondary | ICD-10-CM | POA: Diagnosis not present

## 2017-04-01 DIAGNOSIS — I480 Paroxysmal atrial fibrillation: Secondary | ICD-10-CM | POA: Diagnosis not present

## 2017-04-01 DIAGNOSIS — I10 Essential (primary) hypertension: Secondary | ICD-10-CM | POA: Diagnosis not present

## 2017-04-09 DIAGNOSIS — R142 Eructation: Secondary | ICD-10-CM | POA: Diagnosis not present

## 2017-04-22 IMAGING — CR DG CHEST 2V
2 series · 2 of 2 positions shown · non-contrast
Comparison: Chest x-ray of September 16, 2013

CLINICAL DATA: Preoperative examination prior to right total knee
replacement, previous history of tobacco use.

EXAM:
CHEST  2 VIEW

[w chest pa]
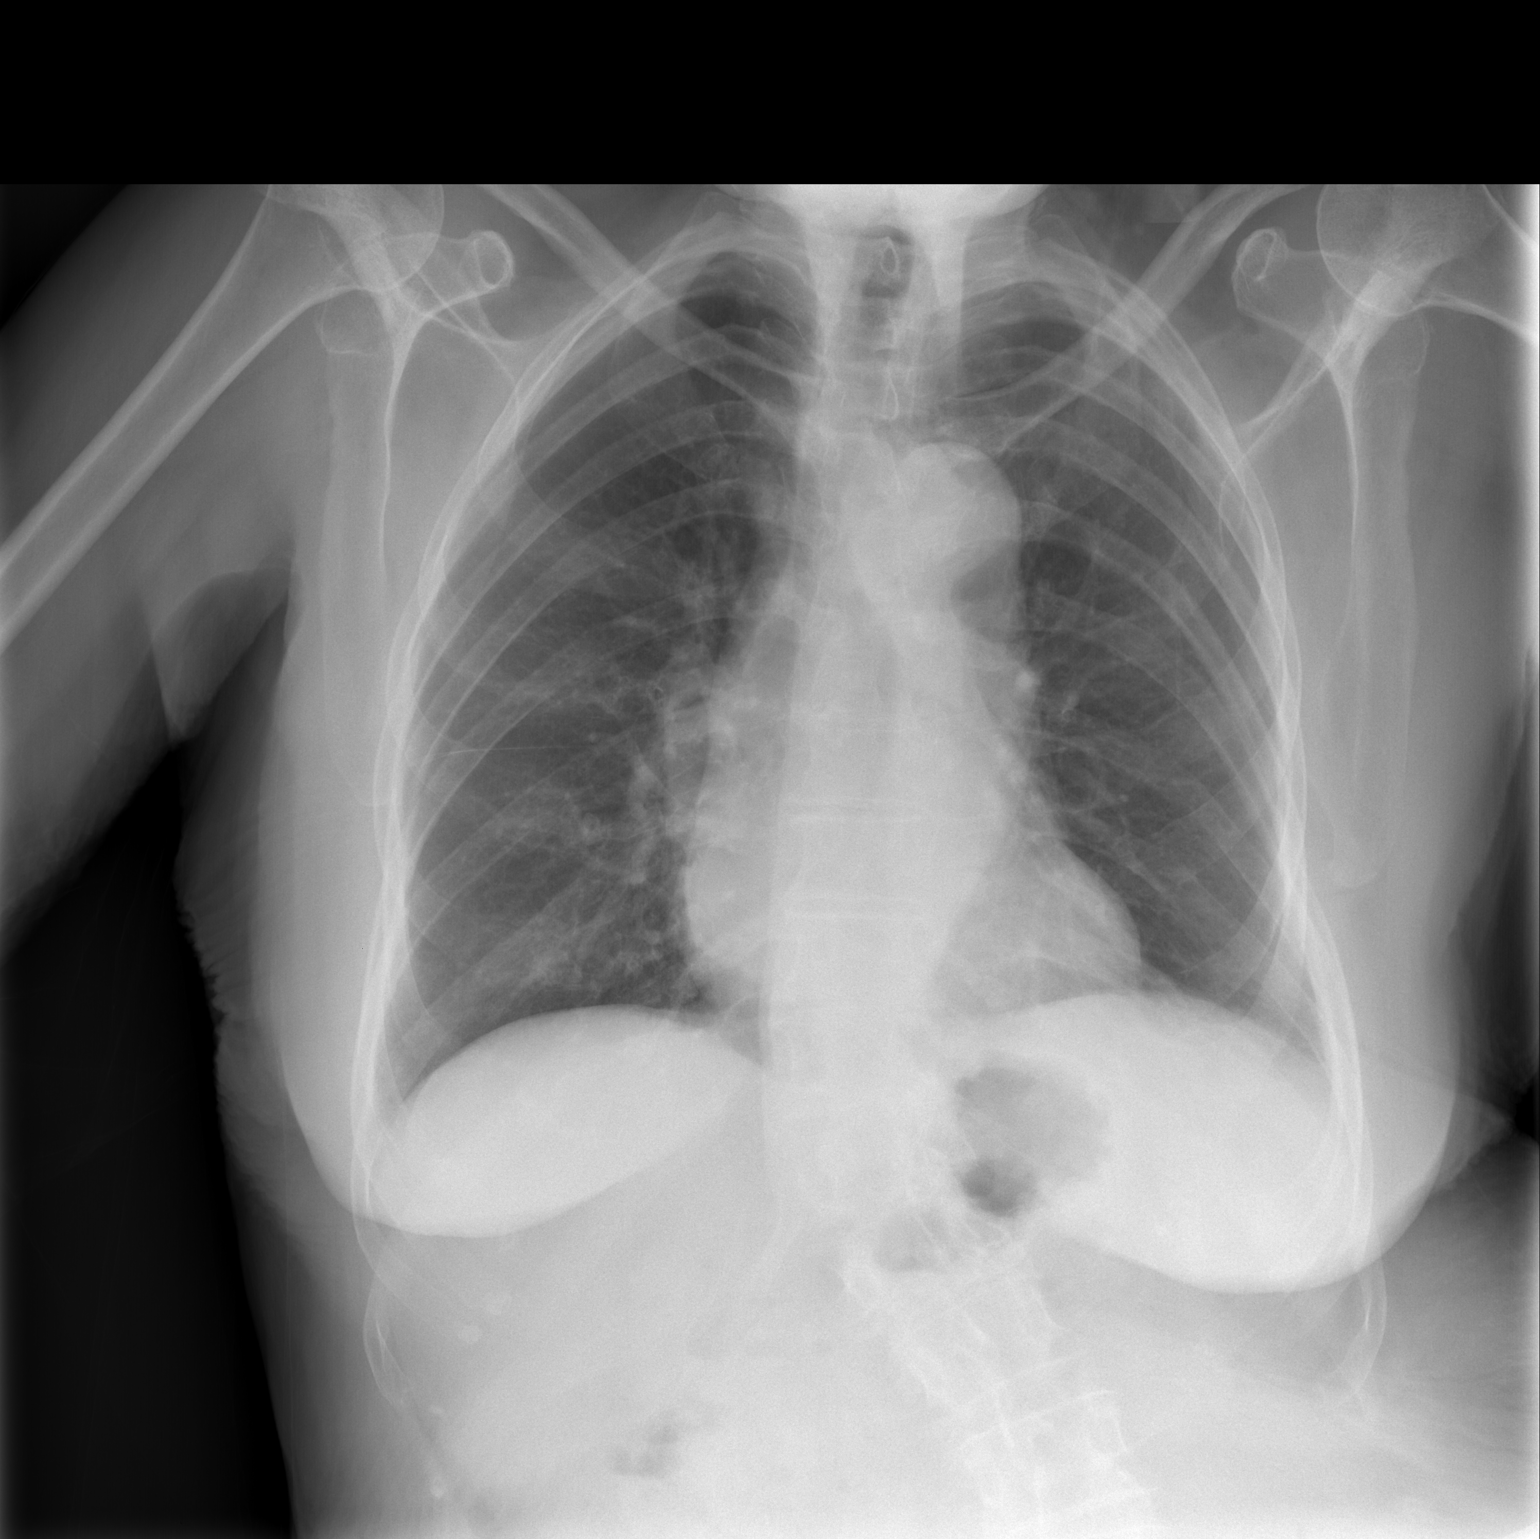

[w chest lat]
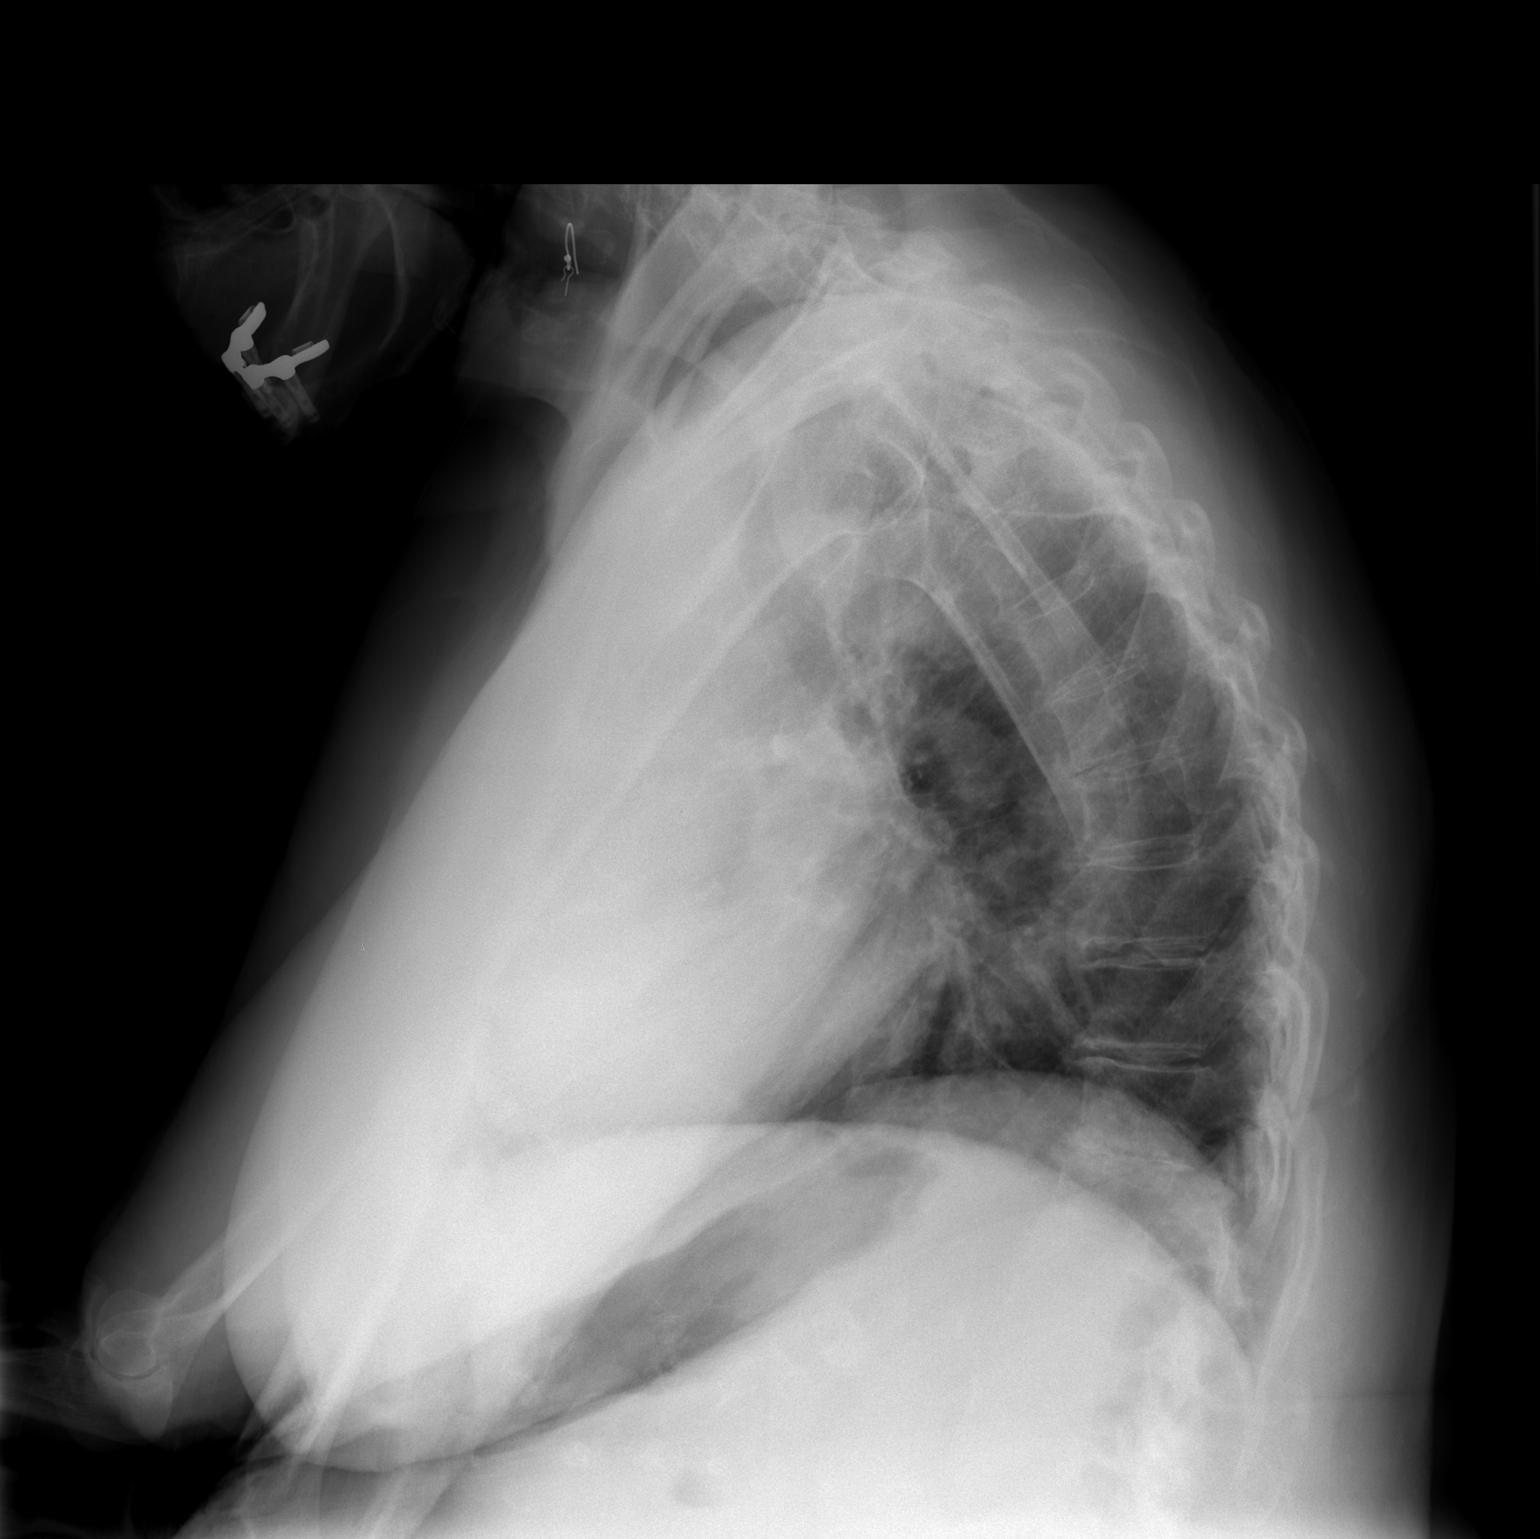

[2 of 2 positions shown; findings below may reference images not displayed]

FINDINGS: The lungs are adequately inflated. There is no focal infiltrate or
pleural effusion. The heart and pulmonary vascularity are normal.
The mediastinum is normal in width. There is mild tortuosity of the
descending thoracic aorta. There is prominent curvature of the
lumbar spine convex towards the left. There degenerative changes of
the shoulders.
IMPRESSION: Mild hyperinflation which may be voluntary or could reflect
underlying COPD. There is no active cardiopulmonary disease.

## 2017-04-23 DIAGNOSIS — N390 Urinary tract infection, site not specified: Secondary | ICD-10-CM | POA: Diagnosis not present

## 2017-04-25 DIAGNOSIS — R197 Diarrhea, unspecified: Secondary | ICD-10-CM | POA: Diagnosis not present

## 2017-05-13 DIAGNOSIS — I480 Paroxysmal atrial fibrillation: Secondary | ICD-10-CM | POA: Diagnosis not present

## 2017-05-13 DIAGNOSIS — R197 Diarrhea, unspecified: Secondary | ICD-10-CM | POA: Diagnosis not present

## 2017-05-13 DIAGNOSIS — F322 Major depressive disorder, single episode, severe without psychotic features: Secondary | ICD-10-CM | POA: Diagnosis not present

## 2017-05-13 DIAGNOSIS — I10 Essential (primary) hypertension: Secondary | ICD-10-CM | POA: Diagnosis not present

## 2017-05-13 DIAGNOSIS — M542 Cervicalgia: Secondary | ICD-10-CM | POA: Diagnosis not present

## 2017-05-13 DIAGNOSIS — G301 Alzheimer's disease with late onset: Secondary | ICD-10-CM | POA: Diagnosis not present

## 2017-05-13 DIAGNOSIS — Z0000 Encounter for general adult medical examination without abnormal findings: Secondary | ICD-10-CM | POA: Diagnosis not present

## 2017-05-13 DIAGNOSIS — Z1389 Encounter for screening for other disorder: Secondary | ICD-10-CM | POA: Diagnosis not present

## 2017-05-27 DIAGNOSIS — M2041 Other hammer toe(s) (acquired), right foot: Secondary | ICD-10-CM | POA: Diagnosis not present

## 2017-05-27 DIAGNOSIS — D2371 Other benign neoplasm of skin of right lower limb, including hip: Secondary | ICD-10-CM | POA: Diagnosis not present

## 2017-07-01 DIAGNOSIS — M542 Cervicalgia: Secondary | ICD-10-CM | POA: Diagnosis not present

## 2017-07-01 DIAGNOSIS — I10 Essential (primary) hypertension: Secondary | ICD-10-CM | POA: Diagnosis not present

## 2017-07-01 DIAGNOSIS — G894 Chronic pain syndrome: Secondary | ICD-10-CM | POA: Diagnosis not present

## 2017-07-15 DIAGNOSIS — L97512 Non-pressure chronic ulcer of other part of right foot with fat layer exposed: Secondary | ICD-10-CM | POA: Diagnosis not present

## 2017-07-28 DIAGNOSIS — N390 Urinary tract infection, site not specified: Secondary | ICD-10-CM | POA: Diagnosis not present

## 2017-08-12 DIAGNOSIS — M71571 Other bursitis, not elsewhere classified, right ankle and foot: Secondary | ICD-10-CM | POA: Diagnosis not present

## 2017-09-13 DIAGNOSIS — M25511 Pain in right shoulder: Secondary | ICD-10-CM | POA: Diagnosis not present

## 2017-09-13 DIAGNOSIS — M25512 Pain in left shoulder: Secondary | ICD-10-CM | POA: Diagnosis not present

## 2017-09-16 DIAGNOSIS — I480 Paroxysmal atrial fibrillation: Secondary | ICD-10-CM | POA: Diagnosis not present

## 2017-09-16 DIAGNOSIS — I10 Essential (primary) hypertension: Secondary | ICD-10-CM | POA: Diagnosis not present

## 2017-09-23 DIAGNOSIS — M71571 Other bursitis, not elsewhere classified, right ankle and foot: Secondary | ICD-10-CM | POA: Diagnosis not present

## 2017-10-14 DIAGNOSIS — H01029 Squamous blepharitis unspecified eye, unspecified eyelid: Secondary | ICD-10-CM | POA: Diagnosis not present

## 2017-10-14 DIAGNOSIS — B308 Other viral conjunctivitis: Secondary | ICD-10-CM | POA: Diagnosis not present

## 2017-10-14 DIAGNOSIS — Z961 Presence of intraocular lens: Secondary | ICD-10-CM | POA: Diagnosis not present

## 2017-10-14 DIAGNOSIS — H1851 Endothelial corneal dystrophy: Secondary | ICD-10-CM | POA: Diagnosis not present

## 2017-11-25 DIAGNOSIS — D2371 Other benign neoplasm of skin of right lower limb, including hip: Secondary | ICD-10-CM | POA: Diagnosis not present

## 2017-11-25 DIAGNOSIS — L97411 Non-pressure chronic ulcer of right heel and midfoot limited to breakdown of skin: Secondary | ICD-10-CM | POA: Diagnosis not present

## 2017-12-02 DIAGNOSIS — L821 Other seborrheic keratosis: Secondary | ICD-10-CM | POA: Diagnosis not present

## 2017-12-02 DIAGNOSIS — D485 Neoplasm of uncertain behavior of skin: Secondary | ICD-10-CM | POA: Diagnosis not present

## 2017-12-02 DIAGNOSIS — L570 Actinic keratosis: Secondary | ICD-10-CM | POA: Diagnosis not present

## 2017-12-02 DIAGNOSIS — D0439 Carcinoma in situ of skin of other parts of face: Secondary | ICD-10-CM | POA: Diagnosis not present

## 2017-12-02 DIAGNOSIS — B078 Other viral warts: Secondary | ICD-10-CM | POA: Diagnosis not present

## 2017-12-18 DIAGNOSIS — M71571 Other bursitis, not elsewhere classified, right ankle and foot: Secondary | ICD-10-CM | POA: Diagnosis not present

## 2017-12-23 DIAGNOSIS — L570 Actinic keratosis: Secondary | ICD-10-CM | POA: Diagnosis not present

## 2017-12-23 DIAGNOSIS — D0430 Carcinoma in situ of skin of unspecified part of face: Secondary | ICD-10-CM | POA: Diagnosis not present

## 2018-01-06 DIAGNOSIS — M2570 Osteophyte, unspecified joint: Secondary | ICD-10-CM | POA: Diagnosis not present

## 2018-01-06 DIAGNOSIS — L97511 Non-pressure chronic ulcer of other part of right foot limited to breakdown of skin: Secondary | ICD-10-CM | POA: Diagnosis not present

## 2018-01-13 DIAGNOSIS — I480 Paroxysmal atrial fibrillation: Secondary | ICD-10-CM | POA: Diagnosis not present

## 2018-01-13 DIAGNOSIS — I10 Essential (primary) hypertension: Secondary | ICD-10-CM | POA: Diagnosis not present

## 2018-01-13 DIAGNOSIS — G2581 Restless legs syndrome: Secondary | ICD-10-CM | POA: Diagnosis not present

## 2018-01-13 DIAGNOSIS — Z79899 Other long term (current) drug therapy: Secondary | ICD-10-CM | POA: Diagnosis not present

## 2018-02-03 DIAGNOSIS — Z01812 Encounter for preprocedural laboratory examination: Secondary | ICD-10-CM | POA: Diagnosis not present

## 2018-02-03 DIAGNOSIS — M2041 Other hammer toe(s) (acquired), right foot: Secondary | ICD-10-CM | POA: Diagnosis not present

## 2018-02-03 DIAGNOSIS — Z79899 Other long term (current) drug therapy: Secondary | ICD-10-CM | POA: Diagnosis not present

## 2018-02-03 DIAGNOSIS — L97511 Non-pressure chronic ulcer of other part of right foot limited to breakdown of skin: Secondary | ICD-10-CM | POA: Diagnosis not present

## 2018-03-12 DIAGNOSIS — C44329 Squamous cell carcinoma of skin of other parts of face: Secondary | ICD-10-CM | POA: Diagnosis not present

## 2018-03-12 DIAGNOSIS — L570 Actinic keratosis: Secondary | ICD-10-CM | POA: Diagnosis not present

## 2018-03-12 DIAGNOSIS — D485 Neoplasm of uncertain behavior of skin: Secondary | ICD-10-CM | POA: Diagnosis not present

## 2018-03-16 DIAGNOSIS — C44329 Squamous cell carcinoma of skin of other parts of face: Secondary | ICD-10-CM | POA: Diagnosis not present

## 2018-03-16 DIAGNOSIS — D485 Neoplasm of uncertain behavior of skin: Secondary | ICD-10-CM | POA: Diagnosis not present

## 2018-03-17 DIAGNOSIS — L603 Nail dystrophy: Secondary | ICD-10-CM | POA: Diagnosis not present

## 2018-03-17 DIAGNOSIS — M2041 Other hammer toe(s) (acquired), right foot: Secondary | ICD-10-CM | POA: Diagnosis not present

## 2018-03-17 DIAGNOSIS — I739 Peripheral vascular disease, unspecified: Secondary | ICD-10-CM | POA: Diagnosis not present

## 2018-03-17 DIAGNOSIS — L97511 Non-pressure chronic ulcer of other part of right foot limited to breakdown of skin: Secondary | ICD-10-CM | POA: Diagnosis not present

## 2018-03-24 DIAGNOSIS — H1851 Endothelial corneal dystrophy: Secondary | ICD-10-CM | POA: Diagnosis not present

## 2018-03-24 DIAGNOSIS — Z961 Presence of intraocular lens: Secondary | ICD-10-CM | POA: Diagnosis not present

## 2018-03-24 DIAGNOSIS — H01029 Squamous blepharitis unspecified eye, unspecified eyelid: Secondary | ICD-10-CM | POA: Diagnosis not present

## 2018-03-24 DIAGNOSIS — B308 Other viral conjunctivitis: Secondary | ICD-10-CM | POA: Diagnosis not present

## 2018-04-23 DIAGNOSIS — C44329 Squamous cell carcinoma of skin of other parts of face: Secondary | ICD-10-CM | POA: Diagnosis not present

## 2018-05-06 DIAGNOSIS — G301 Alzheimer's disease with late onset: Secondary | ICD-10-CM | POA: Diagnosis not present

## 2018-05-06 DIAGNOSIS — I10 Essential (primary) hypertension: Secondary | ICD-10-CM | POA: Diagnosis not present

## 2018-05-06 DIAGNOSIS — G629 Polyneuropathy, unspecified: Secondary | ICD-10-CM | POA: Diagnosis not present

## 2018-05-06 DIAGNOSIS — Z1389 Encounter for screening for other disorder: Secondary | ICD-10-CM | POA: Diagnosis not present

## 2018-05-06 DIAGNOSIS — I480 Paroxysmal atrial fibrillation: Secondary | ICD-10-CM | POA: Diagnosis not present

## 2018-05-26 DIAGNOSIS — L84 Corns and callosities: Secondary | ICD-10-CM | POA: Diagnosis not present

## 2018-05-26 DIAGNOSIS — I739 Peripheral vascular disease, unspecified: Secondary | ICD-10-CM | POA: Diagnosis not present

## 2018-05-26 DIAGNOSIS — M19071 Primary osteoarthritis, right ankle and foot: Secondary | ICD-10-CM | POA: Diagnosis not present

## 2018-08-04 DIAGNOSIS — I739 Peripheral vascular disease, unspecified: Secondary | ICD-10-CM | POA: Diagnosis not present

## 2018-08-04 DIAGNOSIS — L603 Nail dystrophy: Secondary | ICD-10-CM | POA: Diagnosis not present

## 2018-08-04 DIAGNOSIS — L84 Corns and callosities: Secondary | ICD-10-CM | POA: Diagnosis not present

## 2018-08-31 DIAGNOSIS — Z961 Presence of intraocular lens: Secondary | ICD-10-CM | POA: Diagnosis not present

## 2018-08-31 DIAGNOSIS — H01029 Squamous blepharitis unspecified eye, unspecified eyelid: Secondary | ICD-10-CM | POA: Diagnosis not present

## 2018-08-31 DIAGNOSIS — H1851 Endothelial corneal dystrophy: Secondary | ICD-10-CM | POA: Diagnosis not present

## 2018-08-31 DIAGNOSIS — B308 Other viral conjunctivitis: Secondary | ICD-10-CM | POA: Diagnosis not present

## 2018-09-08 DIAGNOSIS — I4891 Unspecified atrial fibrillation: Secondary | ICD-10-CM | POA: Diagnosis not present

## 2018-09-08 DIAGNOSIS — I10 Essential (primary) hypertension: Secondary | ICD-10-CM | POA: Diagnosis not present

## 2018-11-10 DIAGNOSIS — I739 Peripheral vascular disease, unspecified: Secondary | ICD-10-CM | POA: Diagnosis not present

## 2018-11-10 DIAGNOSIS — L84 Corns and callosities: Secondary | ICD-10-CM | POA: Diagnosis not present

## 2018-11-10 DIAGNOSIS — L603 Nail dystrophy: Secondary | ICD-10-CM | POA: Diagnosis not present

## 2019-01-06 DIAGNOSIS — D7589 Other specified diseases of blood and blood-forming organs: Secondary | ICD-10-CM | POA: Diagnosis not present

## 2019-01-06 DIAGNOSIS — K219 Gastro-esophageal reflux disease without esophagitis: Secondary | ICD-10-CM | POA: Diagnosis not present

## 2019-01-06 DIAGNOSIS — Z79899 Other long term (current) drug therapy: Secondary | ICD-10-CM | POA: Diagnosis not present

## 2019-01-06 DIAGNOSIS — G301 Alzheimer's disease with late onset: Secondary | ICD-10-CM | POA: Diagnosis not present

## 2019-01-06 DIAGNOSIS — J300 Vasomotor rhinitis: Secondary | ICD-10-CM | POA: Diagnosis not present

## 2019-01-06 DIAGNOSIS — I480 Paroxysmal atrial fibrillation: Secondary | ICD-10-CM | POA: Diagnosis not present

## 2019-01-06 DIAGNOSIS — I10 Essential (primary) hypertension: Secondary | ICD-10-CM | POA: Diagnosis not present

## 2019-05-11 DIAGNOSIS — D6869 Other thrombophilia: Secondary | ICD-10-CM | POA: Diagnosis not present

## 2019-05-11 DIAGNOSIS — I129 Hypertensive chronic kidney disease with stage 1 through stage 4 chronic kidney disease, or unspecified chronic kidney disease: Secondary | ICD-10-CM | POA: Diagnosis not present

## 2019-05-11 DIAGNOSIS — G301 Alzheimer's disease with late onset: Secondary | ICD-10-CM | POA: Diagnosis not present

## 2019-05-11 DIAGNOSIS — I480 Paroxysmal atrial fibrillation: Secondary | ICD-10-CM | POA: Diagnosis not present

## 2019-05-11 DIAGNOSIS — N183 Chronic kidney disease, stage 3 (moderate): Secondary | ICD-10-CM | POA: Diagnosis not present

## 2019-08-19 DIAGNOSIS — L84 Corns and callosities: Secondary | ICD-10-CM | POA: Diagnosis not present

## 2019-08-19 DIAGNOSIS — L603 Nail dystrophy: Secondary | ICD-10-CM | POA: Diagnosis not present

## 2019-08-19 DIAGNOSIS — I739 Peripheral vascular disease, unspecified: Secondary | ICD-10-CM | POA: Diagnosis not present

## 2019-09-01 DIAGNOSIS — H0102B Squamous blepharitis left eye, upper and lower eyelids: Secondary | ICD-10-CM | POA: Diagnosis not present

## 2019-09-01 DIAGNOSIS — H18513 Endothelial corneal dystrophy, bilateral: Secondary | ICD-10-CM | POA: Diagnosis not present

## 2019-09-01 DIAGNOSIS — Z961 Presence of intraocular lens: Secondary | ICD-10-CM | POA: Diagnosis not present

## 2019-09-01 DIAGNOSIS — H0102A Squamous blepharitis right eye, upper and lower eyelids: Secondary | ICD-10-CM | POA: Diagnosis not present

## 2019-09-14 DIAGNOSIS — Z1389 Encounter for screening for other disorder: Secondary | ICD-10-CM | POA: Diagnosis not present

## 2019-09-14 DIAGNOSIS — Z23 Encounter for immunization: Secondary | ICD-10-CM | POA: Diagnosis not present

## 2019-09-14 DIAGNOSIS — D6869 Other thrombophilia: Secondary | ICD-10-CM | POA: Diagnosis not present

## 2019-09-14 DIAGNOSIS — Z79899 Other long term (current) drug therapy: Secondary | ICD-10-CM | POA: Diagnosis not present

## 2019-09-14 DIAGNOSIS — K219 Gastro-esophageal reflux disease without esophagitis: Secondary | ICD-10-CM | POA: Diagnosis not present

## 2019-09-14 DIAGNOSIS — N1831 Chronic kidney disease, stage 3a: Secondary | ICD-10-CM | POA: Diagnosis not present

## 2019-09-14 DIAGNOSIS — G301 Alzheimer's disease with late onset: Secondary | ICD-10-CM | POA: Diagnosis not present

## 2019-09-14 DIAGNOSIS — I129 Hypertensive chronic kidney disease with stage 1 through stage 4 chronic kidney disease, or unspecified chronic kidney disease: Secondary | ICD-10-CM | POA: Diagnosis not present

## 2019-09-14 DIAGNOSIS — I480 Paroxysmal atrial fibrillation: Secondary | ICD-10-CM | POA: Diagnosis not present

## 2019-09-20 DIAGNOSIS — Z822 Family history of deafness and hearing loss: Secondary | ICD-10-CM | POA: Diagnosis not present

## 2019-09-20 DIAGNOSIS — H9313 Tinnitus, bilateral: Secondary | ICD-10-CM | POA: Diagnosis not present

## 2019-09-20 DIAGNOSIS — H903 Sensorineural hearing loss, bilateral: Secondary | ICD-10-CM | POA: Diagnosis not present

## 2019-11-28 ENCOUNTER — Ambulatory Visit: Payer: Medicare Other

## 2019-12-03 ENCOUNTER — Ambulatory Visit: Payer: Medicare Other

## 2019-12-03 DIAGNOSIS — L603 Nail dystrophy: Secondary | ICD-10-CM | POA: Diagnosis not present

## 2019-12-03 DIAGNOSIS — I739 Peripheral vascular disease, unspecified: Secondary | ICD-10-CM | POA: Diagnosis not present

## 2019-12-03 DIAGNOSIS — L84 Corns and callosities: Secondary | ICD-10-CM | POA: Diagnosis not present

## 2020-01-04 DIAGNOSIS — D0471 Carcinoma in situ of skin of right lower limb, including hip: Secondary | ICD-10-CM | POA: Diagnosis not present

## 2020-01-04 DIAGNOSIS — L918 Other hypertrophic disorders of the skin: Secondary | ICD-10-CM | POA: Diagnosis not present

## 2020-01-04 DIAGNOSIS — L570 Actinic keratosis: Secondary | ICD-10-CM | POA: Diagnosis not present

## 2020-01-04 DIAGNOSIS — D485 Neoplasm of uncertain behavior of skin: Secondary | ICD-10-CM | POA: Diagnosis not present

## 2020-01-19 DIAGNOSIS — N1831 Chronic kidney disease, stage 3a: Secondary | ICD-10-CM | POA: Diagnosis not present

## 2020-01-19 DIAGNOSIS — G301 Alzheimer's disease with late onset: Secondary | ICD-10-CM | POA: Diagnosis not present

## 2020-01-19 DIAGNOSIS — I129 Hypertensive chronic kidney disease with stage 1 through stage 4 chronic kidney disease, or unspecified chronic kidney disease: Secondary | ICD-10-CM | POA: Diagnosis not present

## 2020-01-19 DIAGNOSIS — D6869 Other thrombophilia: Secondary | ICD-10-CM | POA: Diagnosis not present

## 2020-01-19 DIAGNOSIS — K219 Gastro-esophageal reflux disease without esophagitis: Secondary | ICD-10-CM | POA: Diagnosis not present

## 2020-01-19 DIAGNOSIS — I480 Paroxysmal atrial fibrillation: Secondary | ICD-10-CM | POA: Diagnosis not present

## 2020-01-19 DIAGNOSIS — G2581 Restless legs syndrome: Secondary | ICD-10-CM | POA: Diagnosis not present

## 2020-01-19 DIAGNOSIS — F5101 Primary insomnia: Secondary | ICD-10-CM | POA: Diagnosis not present

## 2020-01-27 DIAGNOSIS — D0471 Carcinoma in situ of skin of right lower limb, including hip: Secondary | ICD-10-CM | POA: Diagnosis not present

## 2020-01-27 DIAGNOSIS — L851 Acquired keratosis [keratoderma] palmaris et plantaris: Secondary | ICD-10-CM | POA: Diagnosis not present

## 2020-03-02 DIAGNOSIS — L603 Nail dystrophy: Secondary | ICD-10-CM | POA: Diagnosis not present

## 2020-03-02 DIAGNOSIS — I739 Peripheral vascular disease, unspecified: Secondary | ICD-10-CM | POA: Diagnosis not present

## 2020-03-02 DIAGNOSIS — L84 Corns and callosities: Secondary | ICD-10-CM | POA: Diagnosis not present

## 2020-04-25 DIAGNOSIS — F322 Major depressive disorder, single episode, severe without psychotic features: Secondary | ICD-10-CM | POA: Diagnosis not present

## 2020-04-25 DIAGNOSIS — I129 Hypertensive chronic kidney disease with stage 1 through stage 4 chronic kidney disease, or unspecified chronic kidney disease: Secondary | ICD-10-CM | POA: Diagnosis not present

## 2020-04-25 DIAGNOSIS — I480 Paroxysmal atrial fibrillation: Secondary | ICD-10-CM | POA: Diagnosis not present

## 2020-04-25 DIAGNOSIS — G301 Alzheimer's disease with late onset: Secondary | ICD-10-CM | POA: Diagnosis not present

## 2020-04-25 DIAGNOSIS — N1831 Chronic kidney disease, stage 3a: Secondary | ICD-10-CM | POA: Diagnosis not present

## 2020-05-19 DIAGNOSIS — F458 Other somatoform disorders: Secondary | ICD-10-CM | POA: Diagnosis not present

## 2020-05-19 DIAGNOSIS — F322 Major depressive disorder, single episode, severe without psychotic features: Secondary | ICD-10-CM | POA: Diagnosis not present

## 2020-05-19 DIAGNOSIS — Z0000 Encounter for general adult medical examination without abnormal findings: Secondary | ICD-10-CM | POA: Diagnosis not present

## 2020-05-19 DIAGNOSIS — I480 Paroxysmal atrial fibrillation: Secondary | ICD-10-CM | POA: Diagnosis not present

## 2020-05-19 DIAGNOSIS — K219 Gastro-esophageal reflux disease without esophagitis: Secondary | ICD-10-CM | POA: Diagnosis not present

## 2020-05-19 DIAGNOSIS — Z79899 Other long term (current) drug therapy: Secondary | ICD-10-CM | POA: Diagnosis not present

## 2020-05-19 DIAGNOSIS — G629 Polyneuropathy, unspecified: Secondary | ICD-10-CM | POA: Diagnosis not present

## 2020-05-19 DIAGNOSIS — D6869 Other thrombophilia: Secondary | ICD-10-CM | POA: Diagnosis not present

## 2020-05-19 DIAGNOSIS — K591 Functional diarrhea: Secondary | ICD-10-CM | POA: Diagnosis not present

## 2020-05-19 DIAGNOSIS — Z1389 Encounter for screening for other disorder: Secondary | ICD-10-CM | POA: Diagnosis not present

## 2020-05-19 DIAGNOSIS — N1831 Chronic kidney disease, stage 3a: Secondary | ICD-10-CM | POA: Diagnosis not present

## 2020-05-19 DIAGNOSIS — G301 Alzheimer's disease with late onset: Secondary | ICD-10-CM | POA: Diagnosis not present

## 2020-05-19 DIAGNOSIS — I129 Hypertensive chronic kidney disease with stage 1 through stage 4 chronic kidney disease, or unspecified chronic kidney disease: Secondary | ICD-10-CM | POA: Diagnosis not present

## 2020-05-19 DIAGNOSIS — G2581 Restless legs syndrome: Secondary | ICD-10-CM | POA: Diagnosis not present

## 2020-05-22 DIAGNOSIS — K219 Gastro-esophageal reflux disease without esophagitis: Secondary | ICD-10-CM | POA: Diagnosis not present

## 2020-05-31 DIAGNOSIS — I480 Paroxysmal atrial fibrillation: Secondary | ICD-10-CM | POA: Diagnosis not present

## 2020-05-31 DIAGNOSIS — N1831 Chronic kidney disease, stage 3a: Secondary | ICD-10-CM | POA: Diagnosis not present

## 2020-05-31 DIAGNOSIS — F322 Major depressive disorder, single episode, severe without psychotic features: Secondary | ICD-10-CM | POA: Diagnosis not present

## 2020-05-31 DIAGNOSIS — G301 Alzheimer's disease with late onset: Secondary | ICD-10-CM | POA: Diagnosis not present

## 2020-05-31 DIAGNOSIS — K219 Gastro-esophageal reflux disease without esophagitis: Secondary | ICD-10-CM | POA: Diagnosis not present

## 2020-05-31 DIAGNOSIS — I129 Hypertensive chronic kidney disease with stage 1 through stage 4 chronic kidney disease, or unspecified chronic kidney disease: Secondary | ICD-10-CM | POA: Diagnosis not present

## 2020-06-02 DIAGNOSIS — I739 Peripheral vascular disease, unspecified: Secondary | ICD-10-CM | POA: Diagnosis not present

## 2020-06-02 DIAGNOSIS — L603 Nail dystrophy: Secondary | ICD-10-CM | POA: Diagnosis not present

## 2020-06-02 DIAGNOSIS — L84 Corns and callosities: Secondary | ICD-10-CM | POA: Diagnosis not present

## 2020-06-09 DIAGNOSIS — L851 Acquired keratosis [keratoderma] palmaris et plantaris: Secondary | ICD-10-CM | POA: Diagnosis not present

## 2020-06-09 DIAGNOSIS — L8990 Pressure ulcer of unspecified site, unspecified stage: Secondary | ICD-10-CM | POA: Diagnosis not present

## 2020-06-09 DIAGNOSIS — L22 Diaper dermatitis: Secondary | ICD-10-CM | POA: Diagnosis not present

## 2020-07-11 DIAGNOSIS — I480 Paroxysmal atrial fibrillation: Secondary | ICD-10-CM | POA: Diagnosis not present

## 2020-07-11 DIAGNOSIS — K219 Gastro-esophageal reflux disease without esophagitis: Secondary | ICD-10-CM | POA: Diagnosis not present

## 2020-07-11 DIAGNOSIS — G301 Alzheimer's disease with late onset: Secondary | ICD-10-CM | POA: Diagnosis not present

## 2020-07-11 DIAGNOSIS — F322 Major depressive disorder, single episode, severe without psychotic features: Secondary | ICD-10-CM | POA: Diagnosis not present

## 2020-07-11 DIAGNOSIS — I129 Hypertensive chronic kidney disease with stage 1 through stage 4 chronic kidney disease, or unspecified chronic kidney disease: Secondary | ICD-10-CM | POA: Diagnosis not present

## 2020-07-11 DIAGNOSIS — N1831 Chronic kidney disease, stage 3a: Secondary | ICD-10-CM | POA: Diagnosis not present

## 2020-07-17 DIAGNOSIS — I129 Hypertensive chronic kidney disease with stage 1 through stage 4 chronic kidney disease, or unspecified chronic kidney disease: Secondary | ICD-10-CM | POA: Diagnosis not present

## 2020-07-17 DIAGNOSIS — N1831 Chronic kidney disease, stage 3a: Secondary | ICD-10-CM | POA: Diagnosis not present

## 2020-07-17 DIAGNOSIS — Z23 Encounter for immunization: Secondary | ICD-10-CM | POA: Diagnosis not present

## 2020-07-17 DIAGNOSIS — R1319 Other dysphagia: Secondary | ICD-10-CM | POA: Diagnosis not present

## 2020-07-21 ENCOUNTER — Other Ambulatory Visit: Payer: Self-pay | Admitting: Gastroenterology

## 2020-07-21 DIAGNOSIS — R1310 Dysphagia, unspecified: Secondary | ICD-10-CM

## 2020-07-24 ENCOUNTER — Other Ambulatory Visit: Payer: Self-pay | Admitting: Gastroenterology

## 2020-07-24 ENCOUNTER — Ambulatory Visit
Admission: RE | Admit: 2020-07-24 | Discharge: 2020-07-24 | Disposition: A | Discharge status: Home / Self Care | Payer: Medicare Other | Source: Ambulatory Visit | Attending: Gastroenterology | Admitting: Gastroenterology

## 2020-07-24 DIAGNOSIS — K225 Diverticulum of esophagus, acquired: Secondary | ICD-10-CM | POA: Diagnosis not present

## 2020-07-24 DIAGNOSIS — R1310 Dysphagia, unspecified: Secondary | ICD-10-CM

## 2020-07-25 DIAGNOSIS — I482 Chronic atrial fibrillation, unspecified: Secondary | ICD-10-CM | POA: Diagnosis not present

## 2020-07-25 DIAGNOSIS — K222 Esophageal obstruction: Secondary | ICD-10-CM | POA: Diagnosis not present

## 2020-07-25 DIAGNOSIS — R933 Abnormal findings on diagnostic imaging of other parts of digestive tract: Secondary | ICD-10-CM | POA: Diagnosis not present

## 2020-08-02 DIAGNOSIS — Z8719 Personal history of other diseases of the digestive system: Secondary | ICD-10-CM | POA: Diagnosis not present

## 2020-08-02 DIAGNOSIS — R634 Abnormal weight loss: Secondary | ICD-10-CM | POA: Diagnosis not present

## 2020-08-02 DIAGNOSIS — Z881 Allergy status to other antibiotic agents status: Secondary | ICD-10-CM | POA: Diagnosis not present

## 2020-08-02 DIAGNOSIS — R1319 Other dysphagia: Secondary | ICD-10-CM | POA: Diagnosis not present

## 2020-08-02 DIAGNOSIS — K200 Eosinophilic esophagitis: Secondary | ICD-10-CM | POA: Diagnosis not present

## 2020-08-02 DIAGNOSIS — Z882 Allergy status to sulfonamides status: Secondary | ICD-10-CM | POA: Diagnosis not present

## 2020-08-04 ENCOUNTER — Other Ambulatory Visit: Payer: Self-pay | Admitting: Geriatric Medicine

## 2020-08-04 ENCOUNTER — Ambulatory Visit
Admission: RE | Admit: 2020-08-04 | Discharge: 2020-08-04 | Disposition: A | Discharge status: Home / Self Care | Payer: Medicare Other | Source: Ambulatory Visit | Attending: Geriatric Medicine | Admitting: Geriatric Medicine

## 2020-08-04 DIAGNOSIS — I700 Atherosclerosis of aorta: Secondary | ICD-10-CM | POA: Diagnosis not present

## 2020-08-04 DIAGNOSIS — R0602 Shortness of breath: Secondary | ICD-10-CM | POA: Diagnosis not present

## 2020-08-04 DIAGNOSIS — F322 Major depressive disorder, single episode, severe without psychotic features: Secondary | ICD-10-CM | POA: Diagnosis not present

## 2020-08-04 DIAGNOSIS — I129 Hypertensive chronic kidney disease with stage 1 through stage 4 chronic kidney disease, or unspecified chronic kidney disease: Secondary | ICD-10-CM | POA: Diagnosis not present

## 2020-08-04 DIAGNOSIS — K222 Esophageal obstruction: Secondary | ICD-10-CM | POA: Diagnosis not present

## 2020-08-04 DIAGNOSIS — K219 Gastro-esophageal reflux disease without esophagitis: Secondary | ICD-10-CM | POA: Diagnosis not present

## 2020-08-04 DIAGNOSIS — I480 Paroxysmal atrial fibrillation: Secondary | ICD-10-CM | POA: Diagnosis not present

## 2020-08-04 DIAGNOSIS — G301 Alzheimer's disease with late onset: Secondary | ICD-10-CM | POA: Diagnosis not present

## 2020-08-04 DIAGNOSIS — B3781 Candidal esophagitis: Secondary | ICD-10-CM | POA: Diagnosis not present

## 2020-08-04 DIAGNOSIS — N1831 Chronic kidney disease, stage 3a: Secondary | ICD-10-CM | POA: Diagnosis not present

## 2020-08-04 DIAGNOSIS — R0789 Other chest pain: Secondary | ICD-10-CM | POA: Diagnosis not present

## 2020-08-16 DIAGNOSIS — K219 Gastro-esophageal reflux disease without esophagitis: Secondary | ICD-10-CM | POA: Diagnosis not present

## 2020-08-16 DIAGNOSIS — N189 Chronic kidney disease, unspecified: Secondary | ICD-10-CM | POA: Diagnosis not present

## 2020-08-16 DIAGNOSIS — M3500 Sicca syndrome, unspecified: Secondary | ICD-10-CM | POA: Diagnosis not present

## 2020-08-16 DIAGNOSIS — G2581 Restless legs syndrome: Secondary | ICD-10-CM | POA: Diagnosis not present

## 2020-08-16 DIAGNOSIS — I129 Hypertensive chronic kidney disease with stage 1 through stage 4 chronic kidney disease, or unspecified chronic kidney disease: Secondary | ICD-10-CM | POA: Diagnosis not present

## 2020-08-16 DIAGNOSIS — F0390 Unspecified dementia without behavioral disturbance: Secondary | ICD-10-CM | POA: Diagnosis not present

## 2020-08-16 DIAGNOSIS — L89152 Pressure ulcer of sacral region, stage 2: Secondary | ICD-10-CM | POA: Diagnosis not present

## 2020-08-16 DIAGNOSIS — R1310 Dysphagia, unspecified: Secondary | ICD-10-CM | POA: Diagnosis not present

## 2020-08-16 DIAGNOSIS — Z6820 Body mass index (BMI) 20.0-20.9, adult: Secondary | ICD-10-CM | POA: Diagnosis not present

## 2020-08-16 DIAGNOSIS — I4891 Unspecified atrial fibrillation: Secondary | ICD-10-CM | POA: Diagnosis not present

## 2020-08-16 DIAGNOSIS — F32A Depression, unspecified: Secondary | ICD-10-CM | POA: Diagnosis not present

## 2020-08-16 DIAGNOSIS — K222 Esophageal obstruction: Secondary | ICD-10-CM | POA: Diagnosis not present

## 2020-08-17 DIAGNOSIS — I4891 Unspecified atrial fibrillation: Secondary | ICD-10-CM | POA: Diagnosis not present

## 2020-08-17 DIAGNOSIS — I129 Hypertensive chronic kidney disease with stage 1 through stage 4 chronic kidney disease, or unspecified chronic kidney disease: Secondary | ICD-10-CM | POA: Diagnosis not present

## 2020-08-17 DIAGNOSIS — N189 Chronic kidney disease, unspecified: Secondary | ICD-10-CM | POA: Diagnosis not present

## 2020-08-17 DIAGNOSIS — K222 Esophageal obstruction: Secondary | ICD-10-CM | POA: Diagnosis not present

## 2020-08-17 DIAGNOSIS — R1310 Dysphagia, unspecified: Secondary | ICD-10-CM | POA: Diagnosis not present

## 2020-08-17 DIAGNOSIS — F32A Depression, unspecified: Secondary | ICD-10-CM | POA: Diagnosis not present

## 2020-08-24 ENCOUNTER — Telehealth: Payer: Self-pay | Admitting: Physician Assistant

## 2020-08-24 NOTE — Telephone Encounter (Signed)
I connected by phone with Stacey Jefferson and/or patient's caregiver on 08/24/2020 at 4:26 PM to discuss the potential vaccination through our Homebound vaccination initiative.   Prevaccination Checklist for COVID-19 Vaccines  1.  Are you feeling sick today? no  2.  Have you ever received a dose of a COVID-19 vaccine?  yes      If yes, which one? Pfizer   3.  Have you ever had an allergic reaction: (This would include a severe reaction [ e.g., anaphylaxis] that required treatment with epinephrine or EpiPen or that caused you to go to the hospital.  It would also include an allergic reaction that occurred within 4 hours that caused hives, swelling, or respiratory distress, including wheezing.) A.  A previous dose of COVID-19 vaccine. no  B.  A vaccine or injectable therapy that contains multiple components, one of which is a COVID-19 vaccine component, but it is not known which component elicited the immediate reaction. no  C.  Are you allergic to polyethylene glycol? no  D. Are you allergic to Polysorbate, which is found in some vaccines, film coated tablets and intravenous steroids?  no   4.  Have you ever had an allergic reaction to another vaccine (other than COVID-19 vaccine) or an injectable medication? (This would include a severe reaction [ e.g., anaphylaxis] that required treatment with epinephrine or EpiPen or that caused you to go to the hospital.  It would also include an allergic reaction that occurred within 4 hours that caused hives, swelling, or respiratory distress, including wheezing.)  no   5.  Have you ever had a severe allergic reaction (e.g., anaphylaxis) to something other than a component of the COVID-19 vaccine, or any vaccine or injectable medication?  This would include food, pet, venom, environmental, or oral medication allergies.  no   6.  Have you received any vaccine in the last 14 days? no   7.  Have you ever had a positive test for COVID-19 or has a doctor ever told  you that you had COVID-19?  no   8.  Have you received passive antibody therapy (monoclonal antibodies or convalescent serum) as a treatment for COVID-19? no   9.  Do you have a weakened immune system caused by something such as HIV infection or cancer or do you take immunosuppressive drugs or therapies?  no   10.  Do you have a bleeding disorder or are you taking a blood thinner? yes   11.  Are you pregnant or breast-feeding? no   12.  Do you have dermal fillers? no   __________________   This patient is a 86 y.o. female that meets the FDA criteria to receive homebound vaccination. Patient or parent/caregiver understands they have the option to accept or refuse homebound vaccination.  Patient passed the pre-screening checklist and would like to proceed with homebound vaccination.  Based on questionnaire above, I recommend the patient be observed for 30 minutes.  There are an estimated 0 other household members/caregivers who are also interested in receiving the vaccine.   I will send the patient's information to our scheduling team who will reach out to schedule the patient and potential caregiver/family members for homebound vaccination.  Pt's daughter says she is very difficult to get out of the house. She had her first two covid shots in early 2021 but doesn't remember when.     Stacey Jefferson Form 08/24/2020 4:26 PM

## 2020-08-25 DIAGNOSIS — R1310 Dysphagia, unspecified: Secondary | ICD-10-CM | POA: Diagnosis not present

## 2020-08-25 DIAGNOSIS — I4891 Unspecified atrial fibrillation: Secondary | ICD-10-CM | POA: Diagnosis not present

## 2020-08-25 DIAGNOSIS — F32A Depression, unspecified: Secondary | ICD-10-CM | POA: Diagnosis not present

## 2020-08-25 DIAGNOSIS — N189 Chronic kidney disease, unspecified: Secondary | ICD-10-CM | POA: Diagnosis not present

## 2020-08-25 DIAGNOSIS — K222 Esophageal obstruction: Secondary | ICD-10-CM | POA: Diagnosis not present

## 2020-08-25 DIAGNOSIS — I129 Hypertensive chronic kidney disease with stage 1 through stage 4 chronic kidney disease, or unspecified chronic kidney disease: Secondary | ICD-10-CM | POA: Diagnosis not present

## 2020-08-27 DIAGNOSIS — I480 Paroxysmal atrial fibrillation: Secondary | ICD-10-CM | POA: Diagnosis not present

## 2020-08-27 DIAGNOSIS — N1831 Chronic kidney disease, stage 3a: Secondary | ICD-10-CM | POA: Diagnosis not present

## 2020-08-27 DIAGNOSIS — F322 Major depressive disorder, single episode, severe without psychotic features: Secondary | ICD-10-CM | POA: Diagnosis not present

## 2020-08-27 DIAGNOSIS — G301 Alzheimer's disease with late onset: Secondary | ICD-10-CM | POA: Diagnosis not present

## 2020-08-27 DIAGNOSIS — I129 Hypertensive chronic kidney disease with stage 1 through stage 4 chronic kidney disease, or unspecified chronic kidney disease: Secondary | ICD-10-CM | POA: Diagnosis not present

## 2020-08-27 DIAGNOSIS — K219 Gastro-esophageal reflux disease without esophagitis: Secondary | ICD-10-CM | POA: Diagnosis not present

## 2020-08-28 DIAGNOSIS — F32A Depression, unspecified: Secondary | ICD-10-CM | POA: Diagnosis not present

## 2020-08-28 DIAGNOSIS — I129 Hypertensive chronic kidney disease with stage 1 through stage 4 chronic kidney disease, or unspecified chronic kidney disease: Secondary | ICD-10-CM | POA: Diagnosis not present

## 2020-08-28 DIAGNOSIS — G2581 Restless legs syndrome: Secondary | ICD-10-CM | POA: Diagnosis not present

## 2020-08-28 DIAGNOSIS — I4891 Unspecified atrial fibrillation: Secondary | ICD-10-CM | POA: Diagnosis not present

## 2020-08-28 DIAGNOSIS — N189 Chronic kidney disease, unspecified: Secondary | ICD-10-CM | POA: Diagnosis not present

## 2020-08-28 DIAGNOSIS — R1310 Dysphagia, unspecified: Secondary | ICD-10-CM | POA: Diagnosis not present

## 2020-08-28 DIAGNOSIS — K219 Gastro-esophageal reflux disease without esophagitis: Secondary | ICD-10-CM | POA: Diagnosis not present

## 2020-08-28 DIAGNOSIS — Z6820 Body mass index (BMI) 20.0-20.9, adult: Secondary | ICD-10-CM | POA: Diagnosis not present

## 2020-08-28 DIAGNOSIS — L89152 Pressure ulcer of sacral region, stage 2: Secondary | ICD-10-CM | POA: Diagnosis not present

## 2020-08-28 DIAGNOSIS — M3500 Sicca syndrome, unspecified: Secondary | ICD-10-CM | POA: Diagnosis not present

## 2020-08-28 DIAGNOSIS — F0390 Unspecified dementia without behavioral disturbance: Secondary | ICD-10-CM | POA: Diagnosis not present

## 2020-08-28 DIAGNOSIS — K222 Esophageal obstruction: Secondary | ICD-10-CM | POA: Diagnosis not present

## 2020-08-30 ENCOUNTER — Ambulatory Visit: Payer: Medicare Other

## 2020-08-30 DIAGNOSIS — K222 Esophageal obstruction: Secondary | ICD-10-CM | POA: Diagnosis not present

## 2020-08-30 DIAGNOSIS — I129 Hypertensive chronic kidney disease with stage 1 through stage 4 chronic kidney disease, or unspecified chronic kidney disease: Secondary | ICD-10-CM | POA: Diagnosis not present

## 2020-08-30 DIAGNOSIS — N189 Chronic kidney disease, unspecified: Secondary | ICD-10-CM | POA: Diagnosis not present

## 2020-08-30 DIAGNOSIS — F32A Depression, unspecified: Secondary | ICD-10-CM | POA: Diagnosis not present

## 2020-08-30 DIAGNOSIS — R1310 Dysphagia, unspecified: Secondary | ICD-10-CM | POA: Diagnosis not present

## 2020-08-30 DIAGNOSIS — I4891 Unspecified atrial fibrillation: Secondary | ICD-10-CM | POA: Diagnosis not present

## 2020-09-01 DIAGNOSIS — I129 Hypertensive chronic kidney disease with stage 1 through stage 4 chronic kidney disease, or unspecified chronic kidney disease: Secondary | ICD-10-CM | POA: Diagnosis not present

## 2020-09-01 DIAGNOSIS — I4891 Unspecified atrial fibrillation: Secondary | ICD-10-CM | POA: Diagnosis not present

## 2020-09-01 DIAGNOSIS — R1310 Dysphagia, unspecified: Secondary | ICD-10-CM | POA: Diagnosis not present

## 2020-09-01 DIAGNOSIS — N189 Chronic kidney disease, unspecified: Secondary | ICD-10-CM | POA: Diagnosis not present

## 2020-09-01 DIAGNOSIS — K222 Esophageal obstruction: Secondary | ICD-10-CM | POA: Diagnosis not present

## 2020-09-01 DIAGNOSIS — F32A Depression, unspecified: Secondary | ICD-10-CM | POA: Diagnosis not present

## 2020-09-06 DIAGNOSIS — K222 Esophageal obstruction: Secondary | ICD-10-CM | POA: Diagnosis not present

## 2020-09-06 DIAGNOSIS — I129 Hypertensive chronic kidney disease with stage 1 through stage 4 chronic kidney disease, or unspecified chronic kidney disease: Secondary | ICD-10-CM | POA: Diagnosis not present

## 2020-09-06 DIAGNOSIS — R1310 Dysphagia, unspecified: Secondary | ICD-10-CM | POA: Diagnosis not present

## 2020-09-06 DIAGNOSIS — F32A Depression, unspecified: Secondary | ICD-10-CM | POA: Diagnosis not present

## 2020-09-06 DIAGNOSIS — N189 Chronic kidney disease, unspecified: Secondary | ICD-10-CM | POA: Diagnosis not present

## 2020-09-06 DIAGNOSIS — I4891 Unspecified atrial fibrillation: Secondary | ICD-10-CM | POA: Diagnosis not present

## 2020-09-08 DIAGNOSIS — L603 Nail dystrophy: Secondary | ICD-10-CM | POA: Diagnosis not present

## 2020-09-08 DIAGNOSIS — K222 Esophageal obstruction: Secondary | ICD-10-CM | POA: Diagnosis not present

## 2020-09-08 DIAGNOSIS — I739 Peripheral vascular disease, unspecified: Secondary | ICD-10-CM | POA: Diagnosis not present

## 2020-09-08 DIAGNOSIS — R1310 Dysphagia, unspecified: Secondary | ICD-10-CM | POA: Diagnosis not present

## 2020-09-08 DIAGNOSIS — L84 Corns and callosities: Secondary | ICD-10-CM | POA: Diagnosis not present

## 2020-09-08 DIAGNOSIS — F32A Depression, unspecified: Secondary | ICD-10-CM | POA: Diagnosis not present

## 2020-09-08 DIAGNOSIS — I4891 Unspecified atrial fibrillation: Secondary | ICD-10-CM | POA: Diagnosis not present

## 2020-09-08 DIAGNOSIS — N189 Chronic kidney disease, unspecified: Secondary | ICD-10-CM | POA: Diagnosis not present

## 2020-09-08 DIAGNOSIS — I129 Hypertensive chronic kidney disease with stage 1 through stage 4 chronic kidney disease, or unspecified chronic kidney disease: Secondary | ICD-10-CM | POA: Diagnosis not present

## 2020-09-12 DIAGNOSIS — R1310 Dysphagia, unspecified: Secondary | ICD-10-CM | POA: Diagnosis not present

## 2020-09-12 DIAGNOSIS — I129 Hypertensive chronic kidney disease with stage 1 through stage 4 chronic kidney disease, or unspecified chronic kidney disease: Secondary | ICD-10-CM | POA: Diagnosis not present

## 2020-09-12 DIAGNOSIS — I4891 Unspecified atrial fibrillation: Secondary | ICD-10-CM | POA: Diagnosis not present

## 2020-09-12 DIAGNOSIS — K222 Esophageal obstruction: Secondary | ICD-10-CM | POA: Diagnosis not present

## 2020-09-12 DIAGNOSIS — F32A Depression, unspecified: Secondary | ICD-10-CM | POA: Diagnosis not present

## 2020-09-12 DIAGNOSIS — N189 Chronic kidney disease, unspecified: Secondary | ICD-10-CM | POA: Diagnosis not present

## 2020-09-13 DIAGNOSIS — N189 Chronic kidney disease, unspecified: Secondary | ICD-10-CM | POA: Diagnosis not present

## 2020-09-13 DIAGNOSIS — F32A Depression, unspecified: Secondary | ICD-10-CM | POA: Diagnosis not present

## 2020-09-13 DIAGNOSIS — I4891 Unspecified atrial fibrillation: Secondary | ICD-10-CM | POA: Diagnosis not present

## 2020-09-13 DIAGNOSIS — K222 Esophageal obstruction: Secondary | ICD-10-CM | POA: Diagnosis not present

## 2020-09-13 DIAGNOSIS — I129 Hypertensive chronic kidney disease with stage 1 through stage 4 chronic kidney disease, or unspecified chronic kidney disease: Secondary | ICD-10-CM | POA: Diagnosis not present

## 2020-09-13 DIAGNOSIS — R1310 Dysphagia, unspecified: Secondary | ICD-10-CM | POA: Diagnosis not present

## 2020-09-19 DIAGNOSIS — I129 Hypertensive chronic kidney disease with stage 1 through stage 4 chronic kidney disease, or unspecified chronic kidney disease: Secondary | ICD-10-CM | POA: Diagnosis not present

## 2020-09-19 DIAGNOSIS — R1310 Dysphagia, unspecified: Secondary | ICD-10-CM | POA: Diagnosis not present

## 2020-09-19 DIAGNOSIS — K222 Esophageal obstruction: Secondary | ICD-10-CM | POA: Diagnosis not present

## 2020-09-19 DIAGNOSIS — F32A Depression, unspecified: Secondary | ICD-10-CM | POA: Diagnosis not present

## 2020-09-19 DIAGNOSIS — I4891 Unspecified atrial fibrillation: Secondary | ICD-10-CM | POA: Diagnosis not present

## 2020-09-19 DIAGNOSIS — N189 Chronic kidney disease, unspecified: Secondary | ICD-10-CM | POA: Diagnosis not present

## 2020-09-20 DIAGNOSIS — F32A Depression, unspecified: Secondary | ICD-10-CM | POA: Diagnosis not present

## 2020-09-20 DIAGNOSIS — N189 Chronic kidney disease, unspecified: Secondary | ICD-10-CM | POA: Diagnosis not present

## 2020-09-20 DIAGNOSIS — I129 Hypertensive chronic kidney disease with stage 1 through stage 4 chronic kidney disease, or unspecified chronic kidney disease: Secondary | ICD-10-CM | POA: Diagnosis not present

## 2020-09-20 DIAGNOSIS — R1310 Dysphagia, unspecified: Secondary | ICD-10-CM | POA: Diagnosis not present

## 2020-09-20 DIAGNOSIS — K222 Esophageal obstruction: Secondary | ICD-10-CM | POA: Diagnosis not present

## 2020-09-20 DIAGNOSIS — I4891 Unspecified atrial fibrillation: Secondary | ICD-10-CM | POA: Diagnosis not present

## 2020-09-26 DIAGNOSIS — I480 Paroxysmal atrial fibrillation: Secondary | ICD-10-CM | POA: Diagnosis not present

## 2020-09-26 DIAGNOSIS — N1831 Chronic kidney disease, stage 3a: Secondary | ICD-10-CM | POA: Diagnosis not present

## 2020-09-26 DIAGNOSIS — K222 Esophageal obstruction: Secondary | ICD-10-CM | POA: Diagnosis not present

## 2020-09-26 DIAGNOSIS — G301 Alzheimer's disease with late onset: Secondary | ICD-10-CM | POA: Diagnosis not present

## 2020-09-26 DIAGNOSIS — I129 Hypertensive chronic kidney disease with stage 1 through stage 4 chronic kidney disease, or unspecified chronic kidney disease: Secondary | ICD-10-CM | POA: Diagnosis not present

## 2020-09-26 DIAGNOSIS — I482 Chronic atrial fibrillation, unspecified: Secondary | ICD-10-CM | POA: Diagnosis not present

## 2020-09-26 DIAGNOSIS — F322 Major depressive disorder, single episode, severe without psychotic features: Secondary | ICD-10-CM | POA: Diagnosis not present

## 2020-09-26 DIAGNOSIS — N189 Chronic kidney disease, unspecified: Secondary | ICD-10-CM | POA: Diagnosis not present

## 2020-09-26 DIAGNOSIS — F32A Depression, unspecified: Secondary | ICD-10-CM | POA: Diagnosis not present

## 2020-09-26 DIAGNOSIS — R1310 Dysphagia, unspecified: Secondary | ICD-10-CM | POA: Diagnosis not present

## 2020-09-26 DIAGNOSIS — K219 Gastro-esophageal reflux disease without esophagitis: Secondary | ICD-10-CM | POA: Diagnosis not present

## 2020-09-26 DIAGNOSIS — I4891 Unspecified atrial fibrillation: Secondary | ICD-10-CM | POA: Diagnosis not present

## 2020-09-27 DIAGNOSIS — I129 Hypertensive chronic kidney disease with stage 1 through stage 4 chronic kidney disease, or unspecified chronic kidney disease: Secondary | ICD-10-CM | POA: Diagnosis not present

## 2020-09-27 DIAGNOSIS — F32A Depression, unspecified: Secondary | ICD-10-CM | POA: Diagnosis not present

## 2020-09-27 DIAGNOSIS — G2581 Restless legs syndrome: Secondary | ICD-10-CM | POA: Diagnosis not present

## 2020-09-27 DIAGNOSIS — I4891 Unspecified atrial fibrillation: Secondary | ICD-10-CM | POA: Diagnosis not present

## 2020-09-27 DIAGNOSIS — F0390 Unspecified dementia without behavioral disturbance: Secondary | ICD-10-CM | POA: Diagnosis not present

## 2020-09-27 DIAGNOSIS — L89152 Pressure ulcer of sacral region, stage 2: Secondary | ICD-10-CM | POA: Diagnosis not present

## 2020-09-27 DIAGNOSIS — K222 Esophageal obstruction: Secondary | ICD-10-CM | POA: Diagnosis not present

## 2020-09-27 DIAGNOSIS — K219 Gastro-esophageal reflux disease without esophagitis: Secondary | ICD-10-CM | POA: Diagnosis not present

## 2020-09-27 DIAGNOSIS — Z6820 Body mass index (BMI) 20.0-20.9, adult: Secondary | ICD-10-CM | POA: Diagnosis not present

## 2020-09-27 DIAGNOSIS — R1310 Dysphagia, unspecified: Secondary | ICD-10-CM | POA: Diagnosis not present

## 2020-09-27 DIAGNOSIS — N189 Chronic kidney disease, unspecified: Secondary | ICD-10-CM | POA: Diagnosis not present

## 2020-09-27 DIAGNOSIS — M3500 Sicca syndrome, unspecified: Secondary | ICD-10-CM | POA: Diagnosis not present

## 2020-10-03 DIAGNOSIS — N189 Chronic kidney disease, unspecified: Secondary | ICD-10-CM | POA: Diagnosis not present

## 2020-10-03 DIAGNOSIS — R1310 Dysphagia, unspecified: Secondary | ICD-10-CM | POA: Diagnosis not present

## 2020-10-03 DIAGNOSIS — I4891 Unspecified atrial fibrillation: Secondary | ICD-10-CM | POA: Diagnosis not present

## 2020-10-03 DIAGNOSIS — I129 Hypertensive chronic kidney disease with stage 1 through stage 4 chronic kidney disease, or unspecified chronic kidney disease: Secondary | ICD-10-CM | POA: Diagnosis not present

## 2020-10-03 DIAGNOSIS — F32A Depression, unspecified: Secondary | ICD-10-CM | POA: Diagnosis not present

## 2020-10-03 DIAGNOSIS — K222 Esophageal obstruction: Secondary | ICD-10-CM | POA: Diagnosis not present

## 2020-10-04 DIAGNOSIS — I4891 Unspecified atrial fibrillation: Secondary | ICD-10-CM | POA: Diagnosis not present

## 2020-10-04 DIAGNOSIS — F32A Depression, unspecified: Secondary | ICD-10-CM | POA: Diagnosis not present

## 2020-10-04 DIAGNOSIS — N189 Chronic kidney disease, unspecified: Secondary | ICD-10-CM | POA: Diagnosis not present

## 2020-10-04 DIAGNOSIS — K222 Esophageal obstruction: Secondary | ICD-10-CM | POA: Diagnosis not present

## 2020-10-04 DIAGNOSIS — I129 Hypertensive chronic kidney disease with stage 1 through stage 4 chronic kidney disease, or unspecified chronic kidney disease: Secondary | ICD-10-CM | POA: Diagnosis not present

## 2020-10-04 DIAGNOSIS — R1310 Dysphagia, unspecified: Secondary | ICD-10-CM | POA: Diagnosis not present

## 2020-10-06 DIAGNOSIS — N189 Chronic kidney disease, unspecified: Secondary | ICD-10-CM | POA: Diagnosis not present

## 2020-10-06 DIAGNOSIS — F32A Depression, unspecified: Secondary | ICD-10-CM | POA: Diagnosis not present

## 2020-10-06 DIAGNOSIS — I129 Hypertensive chronic kidney disease with stage 1 through stage 4 chronic kidney disease, or unspecified chronic kidney disease: Secondary | ICD-10-CM | POA: Diagnosis not present

## 2020-10-06 DIAGNOSIS — R1310 Dysphagia, unspecified: Secondary | ICD-10-CM | POA: Diagnosis not present

## 2020-10-06 DIAGNOSIS — I4891 Unspecified atrial fibrillation: Secondary | ICD-10-CM | POA: Diagnosis not present

## 2020-10-06 DIAGNOSIS — K222 Esophageal obstruction: Secondary | ICD-10-CM | POA: Diagnosis not present

## 2020-10-10 ENCOUNTER — Ambulatory Visit: Payer: Medicare Other | Attending: Critical Care Medicine

## 2020-10-10 DIAGNOSIS — I4891 Unspecified atrial fibrillation: Secondary | ICD-10-CM | POA: Diagnosis not present

## 2020-10-10 DIAGNOSIS — I129 Hypertensive chronic kidney disease with stage 1 through stage 4 chronic kidney disease, or unspecified chronic kidney disease: Secondary | ICD-10-CM | POA: Diagnosis not present

## 2020-10-10 DIAGNOSIS — F32A Depression, unspecified: Secondary | ICD-10-CM | POA: Diagnosis not present

## 2020-10-10 DIAGNOSIS — Z23 Encounter for immunization: Secondary | ICD-10-CM

## 2020-10-10 DIAGNOSIS — K222 Esophageal obstruction: Secondary | ICD-10-CM | POA: Diagnosis not present

## 2020-10-10 DIAGNOSIS — R1310 Dysphagia, unspecified: Secondary | ICD-10-CM | POA: Diagnosis not present

## 2020-10-10 DIAGNOSIS — N189 Chronic kidney disease, unspecified: Secondary | ICD-10-CM | POA: Diagnosis not present

## 2020-10-10 NOTE — Progress Notes (Signed)
   Covid-19 Vaccination Clinic  Name:  [name]    MRN: [id number] DOB: [birth year]  10/10/2020  [name] was observed post Covid-19 immunization 15 without incident. She was provided with Vaccine Information Sheet and instruction to access the V-Safe system.   [name] was instructed to call 911 with any severe reactions post vaccine: Marland Kitchen Difficulty breathing  . Swelling of face and throat  . A fast heartbeat  . A bad rash all over body  . Dizziness and weakness   Immunizations Administered    Name Date Dose VIS Date [place] COVID-19 Vaccine 10/10/2020 11:20 AM 0.3 mL 08/16/2020 Intramuscular   Manufacturer: [place]   Lot: [id number]   NDC: [address]-0

## 2020-10-11 DIAGNOSIS — N189 Chronic kidney disease, unspecified: Secondary | ICD-10-CM | POA: Diagnosis not present

## 2020-10-11 DIAGNOSIS — F32A Depression, unspecified: Secondary | ICD-10-CM | POA: Diagnosis not present

## 2020-10-11 DIAGNOSIS — K222 Esophageal obstruction: Secondary | ICD-10-CM | POA: Diagnosis not present

## 2020-10-11 DIAGNOSIS — R1310 Dysphagia, unspecified: Secondary | ICD-10-CM | POA: Diagnosis not present

## 2020-10-11 DIAGNOSIS — I129 Hypertensive chronic kidney disease with stage 1 through stage 4 chronic kidney disease, or unspecified chronic kidney disease: Secondary | ICD-10-CM | POA: Diagnosis not present

## 2020-10-11 DIAGNOSIS — I4891 Unspecified atrial fibrillation: Secondary | ICD-10-CM | POA: Diagnosis not present

## 2020-10-18 DIAGNOSIS — Z961 Presence of intraocular lens: Secondary | ICD-10-CM | POA: Diagnosis not present

## 2020-10-18 DIAGNOSIS — R1310 Dysphagia, unspecified: Secondary | ICD-10-CM | POA: Diagnosis not present

## 2020-10-18 DIAGNOSIS — K222 Esophageal obstruction: Secondary | ICD-10-CM | POA: Diagnosis not present

## 2020-10-18 DIAGNOSIS — F32A Depression, unspecified: Secondary | ICD-10-CM | POA: Diagnosis not present

## 2020-10-18 DIAGNOSIS — I4891 Unspecified atrial fibrillation: Secondary | ICD-10-CM | POA: Diagnosis not present

## 2020-10-18 DIAGNOSIS — I129 Hypertensive chronic kidney disease with stage 1 through stage 4 chronic kidney disease, or unspecified chronic kidney disease: Secondary | ICD-10-CM | POA: Diagnosis not present

## 2020-10-18 DIAGNOSIS — H532 Diplopia: Secondary | ICD-10-CM | POA: Diagnosis not present

## 2020-10-18 DIAGNOSIS — N189 Chronic kidney disease, unspecified: Secondary | ICD-10-CM | POA: Diagnosis not present

## 2020-10-18 DIAGNOSIS — H18513 Endothelial corneal dystrophy, bilateral: Secondary | ICD-10-CM | POA: Diagnosis not present

## 2020-10-18 DIAGNOSIS — H02402 Unspecified ptosis of left eyelid: Secondary | ICD-10-CM | POA: Diagnosis not present

## 2020-10-25 DIAGNOSIS — G301 Alzheimer's disease with late onset: Secondary | ICD-10-CM | POA: Diagnosis not present

## 2020-10-25 DIAGNOSIS — K222 Esophageal obstruction: Secondary | ICD-10-CM | POA: Diagnosis not present

## 2020-10-25 DIAGNOSIS — R1310 Dysphagia, unspecified: Secondary | ICD-10-CM | POA: Diagnosis not present

## 2020-10-25 DIAGNOSIS — N189 Chronic kidney disease, unspecified: Secondary | ICD-10-CM | POA: Diagnosis not present

## 2020-10-25 DIAGNOSIS — K219 Gastro-esophageal reflux disease without esophagitis: Secondary | ICD-10-CM | POA: Diagnosis not present

## 2020-10-25 DIAGNOSIS — N1831 Chronic kidney disease, stage 3a: Secondary | ICD-10-CM | POA: Diagnosis not present

## 2020-10-25 DIAGNOSIS — F322 Major depressive disorder, single episode, severe without psychotic features: Secondary | ICD-10-CM | POA: Diagnosis not present

## 2020-10-25 DIAGNOSIS — I482 Chronic atrial fibrillation, unspecified: Secondary | ICD-10-CM | POA: Diagnosis not present

## 2020-10-25 DIAGNOSIS — I129 Hypertensive chronic kidney disease with stage 1 through stage 4 chronic kidney disease, or unspecified chronic kidney disease: Secondary | ICD-10-CM | POA: Diagnosis not present

## 2020-10-25 DIAGNOSIS — I4891 Unspecified atrial fibrillation: Secondary | ICD-10-CM | POA: Diagnosis not present

## 2020-10-25 DIAGNOSIS — F32A Depression, unspecified: Secondary | ICD-10-CM | POA: Diagnosis not present

## 2020-10-25 DIAGNOSIS — I480 Paroxysmal atrial fibrillation: Secondary | ICD-10-CM | POA: Diagnosis not present

## 2020-10-28 DIAGNOSIS — Z6820 Body mass index (BMI) 20.0-20.9, adult: Secondary | ICD-10-CM | POA: Diagnosis not present

## 2020-10-28 DIAGNOSIS — K219 Gastro-esophageal reflux disease without esophagitis: Secondary | ICD-10-CM | POA: Diagnosis not present

## 2020-10-28 DIAGNOSIS — G2581 Restless legs syndrome: Secondary | ICD-10-CM | POA: Diagnosis not present

## 2020-10-28 DIAGNOSIS — K222 Esophageal obstruction: Secondary | ICD-10-CM | POA: Diagnosis not present

## 2020-10-28 DIAGNOSIS — I4891 Unspecified atrial fibrillation: Secondary | ICD-10-CM | POA: Diagnosis not present

## 2020-10-28 DIAGNOSIS — L89152 Pressure ulcer of sacral region, stage 2: Secondary | ICD-10-CM | POA: Diagnosis not present

## 2020-10-28 DIAGNOSIS — F32A Depression, unspecified: Secondary | ICD-10-CM | POA: Diagnosis not present

## 2020-10-28 DIAGNOSIS — N189 Chronic kidney disease, unspecified: Secondary | ICD-10-CM | POA: Diagnosis not present

## 2020-10-28 DIAGNOSIS — M3500 Sicca syndrome, unspecified: Secondary | ICD-10-CM | POA: Diagnosis not present

## 2020-10-28 DIAGNOSIS — I129 Hypertensive chronic kidney disease with stage 1 through stage 4 chronic kidney disease, or unspecified chronic kidney disease: Secondary | ICD-10-CM | POA: Diagnosis not present

## 2020-10-28 DIAGNOSIS — F0390 Unspecified dementia without behavioral disturbance: Secondary | ICD-10-CM | POA: Diagnosis not present

## 2020-10-28 DIAGNOSIS — R1310 Dysphagia, unspecified: Secondary | ICD-10-CM | POA: Diagnosis not present

## 2020-11-01 DIAGNOSIS — I4891 Unspecified atrial fibrillation: Secondary | ICD-10-CM | POA: Diagnosis not present

## 2020-11-01 DIAGNOSIS — F32A Depression, unspecified: Secondary | ICD-10-CM | POA: Diagnosis not present

## 2020-11-01 DIAGNOSIS — N189 Chronic kidney disease, unspecified: Secondary | ICD-10-CM | POA: Diagnosis not present

## 2020-11-01 DIAGNOSIS — I129 Hypertensive chronic kidney disease with stage 1 through stage 4 chronic kidney disease, or unspecified chronic kidney disease: Secondary | ICD-10-CM | POA: Diagnosis not present

## 2020-11-01 DIAGNOSIS — K222 Esophageal obstruction: Secondary | ICD-10-CM | POA: Diagnosis not present

## 2020-11-01 DIAGNOSIS — R1310 Dysphagia, unspecified: Secondary | ICD-10-CM | POA: Diagnosis not present

## 2020-11-03 DIAGNOSIS — I129 Hypertensive chronic kidney disease with stage 1 through stage 4 chronic kidney disease, or unspecified chronic kidney disease: Secondary | ICD-10-CM | POA: Diagnosis not present

## 2020-11-03 DIAGNOSIS — N189 Chronic kidney disease, unspecified: Secondary | ICD-10-CM | POA: Diagnosis not present

## 2020-11-03 DIAGNOSIS — K222 Esophageal obstruction: Secondary | ICD-10-CM | POA: Diagnosis not present

## 2020-11-03 DIAGNOSIS — I4891 Unspecified atrial fibrillation: Secondary | ICD-10-CM | POA: Diagnosis not present

## 2020-11-03 DIAGNOSIS — F32A Depression, unspecified: Secondary | ICD-10-CM | POA: Diagnosis not present

## 2020-11-03 DIAGNOSIS — R1310 Dysphagia, unspecified: Secondary | ICD-10-CM | POA: Diagnosis not present

## 2020-11-06 DIAGNOSIS — F32A Depression, unspecified: Secondary | ICD-10-CM | POA: Diagnosis not present

## 2020-11-06 DIAGNOSIS — H903 Sensorineural hearing loss, bilateral: Secondary | ICD-10-CM | POA: Diagnosis not present

## 2020-11-06 DIAGNOSIS — I129 Hypertensive chronic kidney disease with stage 1 through stage 4 chronic kidney disease, or unspecified chronic kidney disease: Secondary | ICD-10-CM | POA: Diagnosis not present

## 2020-11-06 DIAGNOSIS — H9313 Tinnitus, bilateral: Secondary | ICD-10-CM | POA: Diagnosis not present

## 2020-11-06 DIAGNOSIS — K222 Esophageal obstruction: Secondary | ICD-10-CM | POA: Diagnosis not present

## 2020-11-06 DIAGNOSIS — Z822 Family history of deafness and hearing loss: Secondary | ICD-10-CM | POA: Diagnosis not present

## 2020-11-06 DIAGNOSIS — I4891 Unspecified atrial fibrillation: Secondary | ICD-10-CM | POA: Diagnosis not present

## 2020-11-06 DIAGNOSIS — N189 Chronic kidney disease, unspecified: Secondary | ICD-10-CM | POA: Diagnosis not present

## 2020-11-06 DIAGNOSIS — R1310 Dysphagia, unspecified: Secondary | ICD-10-CM | POA: Diagnosis not present

## 2020-11-08 DIAGNOSIS — N189 Chronic kidney disease, unspecified: Secondary | ICD-10-CM | POA: Diagnosis not present

## 2020-11-08 DIAGNOSIS — F32A Depression, unspecified: Secondary | ICD-10-CM | POA: Diagnosis not present

## 2020-11-08 DIAGNOSIS — R1310 Dysphagia, unspecified: Secondary | ICD-10-CM | POA: Diagnosis not present

## 2020-11-08 DIAGNOSIS — I4891 Unspecified atrial fibrillation: Secondary | ICD-10-CM | POA: Diagnosis not present

## 2020-11-08 DIAGNOSIS — K222 Esophageal obstruction: Secondary | ICD-10-CM | POA: Diagnosis not present

## 2020-11-08 DIAGNOSIS — I129 Hypertensive chronic kidney disease with stage 1 through stage 4 chronic kidney disease, or unspecified chronic kidney disease: Secondary | ICD-10-CM | POA: Diagnosis not present

## 2020-11-15 DIAGNOSIS — F32A Depression, unspecified: Secondary | ICD-10-CM | POA: Diagnosis not present

## 2020-11-15 DIAGNOSIS — K222 Esophageal obstruction: Secondary | ICD-10-CM | POA: Diagnosis not present

## 2020-11-15 DIAGNOSIS — R1310 Dysphagia, unspecified: Secondary | ICD-10-CM | POA: Diagnosis not present

## 2020-11-15 DIAGNOSIS — I4891 Unspecified atrial fibrillation: Secondary | ICD-10-CM | POA: Diagnosis not present

## 2020-11-15 DIAGNOSIS — I129 Hypertensive chronic kidney disease with stage 1 through stage 4 chronic kidney disease, or unspecified chronic kidney disease: Secondary | ICD-10-CM | POA: Diagnosis not present

## 2020-11-15 DIAGNOSIS — N189 Chronic kidney disease, unspecified: Secondary | ICD-10-CM | POA: Diagnosis not present

## 2020-11-16 DIAGNOSIS — R1310 Dysphagia, unspecified: Secondary | ICD-10-CM | POA: Diagnosis not present

## 2020-11-16 DIAGNOSIS — I4891 Unspecified atrial fibrillation: Secondary | ICD-10-CM | POA: Diagnosis not present

## 2020-11-16 DIAGNOSIS — I129 Hypertensive chronic kidney disease with stage 1 through stage 4 chronic kidney disease, or unspecified chronic kidney disease: Secondary | ICD-10-CM | POA: Diagnosis not present

## 2020-11-16 DIAGNOSIS — K222 Esophageal obstruction: Secondary | ICD-10-CM | POA: Diagnosis not present

## 2020-11-16 DIAGNOSIS — N189 Chronic kidney disease, unspecified: Secondary | ICD-10-CM | POA: Diagnosis not present

## 2020-11-16 DIAGNOSIS — F32A Depression, unspecified: Secondary | ICD-10-CM | POA: Diagnosis not present

## 2020-11-17 DIAGNOSIS — K222 Esophageal obstruction: Secondary | ICD-10-CM | POA: Diagnosis not present

## 2020-11-17 DIAGNOSIS — R1310 Dysphagia, unspecified: Secondary | ICD-10-CM | POA: Diagnosis not present

## 2020-11-17 DIAGNOSIS — I4891 Unspecified atrial fibrillation: Secondary | ICD-10-CM | POA: Diagnosis not present

## 2020-11-17 DIAGNOSIS — I129 Hypertensive chronic kidney disease with stage 1 through stage 4 chronic kidney disease, or unspecified chronic kidney disease: Secondary | ICD-10-CM | POA: Diagnosis not present

## 2020-11-17 DIAGNOSIS — F32A Depression, unspecified: Secondary | ICD-10-CM | POA: Diagnosis not present

## 2020-11-17 DIAGNOSIS — N189 Chronic kidney disease, unspecified: Secondary | ICD-10-CM | POA: Diagnosis not present

## 2020-11-22 DIAGNOSIS — I4891 Unspecified atrial fibrillation: Secondary | ICD-10-CM | POA: Diagnosis not present

## 2020-11-22 DIAGNOSIS — F32A Depression, unspecified: Secondary | ICD-10-CM | POA: Diagnosis not present

## 2020-11-22 DIAGNOSIS — R1310 Dysphagia, unspecified: Secondary | ICD-10-CM | POA: Diagnosis not present

## 2020-11-22 DIAGNOSIS — K222 Esophageal obstruction: Secondary | ICD-10-CM | POA: Diagnosis not present

## 2020-11-22 DIAGNOSIS — N189 Chronic kidney disease, unspecified: Secondary | ICD-10-CM | POA: Diagnosis not present

## 2020-11-22 DIAGNOSIS — I129 Hypertensive chronic kidney disease with stage 1 through stage 4 chronic kidney disease, or unspecified chronic kidney disease: Secondary | ICD-10-CM | POA: Diagnosis not present

## 2020-11-23 DIAGNOSIS — I129 Hypertensive chronic kidney disease with stage 1 through stage 4 chronic kidney disease, or unspecified chronic kidney disease: Secondary | ICD-10-CM | POA: Diagnosis not present

## 2020-11-23 DIAGNOSIS — I4891 Unspecified atrial fibrillation: Secondary | ICD-10-CM | POA: Diagnosis not present

## 2020-11-23 DIAGNOSIS — K222 Esophageal obstruction: Secondary | ICD-10-CM | POA: Diagnosis not present

## 2020-11-23 DIAGNOSIS — F32A Depression, unspecified: Secondary | ICD-10-CM | POA: Diagnosis not present

## 2020-11-23 DIAGNOSIS — R1310 Dysphagia, unspecified: Secondary | ICD-10-CM | POA: Diagnosis not present

## 2020-11-23 DIAGNOSIS — N189 Chronic kidney disease, unspecified: Secondary | ICD-10-CM | POA: Diagnosis not present

## 2020-11-24 DIAGNOSIS — I4891 Unspecified atrial fibrillation: Secondary | ICD-10-CM | POA: Diagnosis not present

## 2020-11-24 DIAGNOSIS — F32A Depression, unspecified: Secondary | ICD-10-CM | POA: Diagnosis not present

## 2020-11-24 DIAGNOSIS — R1310 Dysphagia, unspecified: Secondary | ICD-10-CM | POA: Diagnosis not present

## 2020-11-24 DIAGNOSIS — K222 Esophageal obstruction: Secondary | ICD-10-CM | POA: Diagnosis not present

## 2020-11-24 DIAGNOSIS — I129 Hypertensive chronic kidney disease with stage 1 through stage 4 chronic kidney disease, or unspecified chronic kidney disease: Secondary | ICD-10-CM | POA: Diagnosis not present

## 2020-11-24 DIAGNOSIS — N189 Chronic kidney disease, unspecified: Secondary | ICD-10-CM | POA: Diagnosis not present

## 2020-11-27 DIAGNOSIS — N189 Chronic kidney disease, unspecified: Secondary | ICD-10-CM | POA: Diagnosis not present

## 2020-11-27 DIAGNOSIS — I129 Hypertensive chronic kidney disease with stage 1 through stage 4 chronic kidney disease, or unspecified chronic kidney disease: Secondary | ICD-10-CM | POA: Diagnosis not present

## 2020-11-27 DIAGNOSIS — R1310 Dysphagia, unspecified: Secondary | ICD-10-CM | POA: Diagnosis not present

## 2020-11-27 DIAGNOSIS — I4891 Unspecified atrial fibrillation: Secondary | ICD-10-CM | POA: Diagnosis not present

## 2020-11-27 DIAGNOSIS — K222 Esophageal obstruction: Secondary | ICD-10-CM | POA: Diagnosis not present

## 2020-11-27 DIAGNOSIS — F32A Depression, unspecified: Secondary | ICD-10-CM | POA: Diagnosis not present

## 2020-11-28 DIAGNOSIS — Z6820 Body mass index (BMI) 20.0-20.9, adult: Secondary | ICD-10-CM | POA: Diagnosis not present

## 2020-11-28 DIAGNOSIS — I129 Hypertensive chronic kidney disease with stage 1 through stage 4 chronic kidney disease, or unspecified chronic kidney disease: Secondary | ICD-10-CM | POA: Diagnosis not present

## 2020-11-28 DIAGNOSIS — R1310 Dysphagia, unspecified: Secondary | ICD-10-CM | POA: Diagnosis not present

## 2020-11-28 DIAGNOSIS — K219 Gastro-esophageal reflux disease without esophagitis: Secondary | ICD-10-CM | POA: Diagnosis not present

## 2020-11-28 DIAGNOSIS — I4891 Unspecified atrial fibrillation: Secondary | ICD-10-CM | POA: Diagnosis not present

## 2020-11-28 DIAGNOSIS — N189 Chronic kidney disease, unspecified: Secondary | ICD-10-CM | POA: Diagnosis not present

## 2020-11-28 DIAGNOSIS — K222 Esophageal obstruction: Secondary | ICD-10-CM | POA: Diagnosis not present

## 2020-11-28 DIAGNOSIS — G2581 Restless legs syndrome: Secondary | ICD-10-CM | POA: Diagnosis not present

## 2020-11-28 DIAGNOSIS — F0390 Unspecified dementia without behavioral disturbance: Secondary | ICD-10-CM | POA: Diagnosis not present

## 2020-11-28 DIAGNOSIS — M3500 Sicca syndrome, unspecified: Secondary | ICD-10-CM | POA: Diagnosis not present

## 2020-11-28 DIAGNOSIS — L89152 Pressure ulcer of sacral region, stage 2: Secondary | ICD-10-CM | POA: Diagnosis not present

## 2020-11-28 DIAGNOSIS — F32A Depression, unspecified: Secondary | ICD-10-CM | POA: Diagnosis not present

## 2020-11-29 DIAGNOSIS — K222 Esophageal obstruction: Secondary | ICD-10-CM | POA: Diagnosis not present

## 2020-11-29 DIAGNOSIS — R1310 Dysphagia, unspecified: Secondary | ICD-10-CM | POA: Diagnosis not present

## 2020-11-29 DIAGNOSIS — F32A Depression, unspecified: Secondary | ICD-10-CM | POA: Diagnosis not present

## 2020-11-29 DIAGNOSIS — N189 Chronic kidney disease, unspecified: Secondary | ICD-10-CM | POA: Diagnosis not present

## 2020-11-29 DIAGNOSIS — I129 Hypertensive chronic kidney disease with stage 1 through stage 4 chronic kidney disease, or unspecified chronic kidney disease: Secondary | ICD-10-CM | POA: Diagnosis not present

## 2020-11-29 DIAGNOSIS — I4891 Unspecified atrial fibrillation: Secondary | ICD-10-CM | POA: Diagnosis not present

## 2020-11-30 DIAGNOSIS — N189 Chronic kidney disease, unspecified: Secondary | ICD-10-CM | POA: Diagnosis not present

## 2020-11-30 DIAGNOSIS — I4891 Unspecified atrial fibrillation: Secondary | ICD-10-CM | POA: Diagnosis not present

## 2020-11-30 DIAGNOSIS — F32A Depression, unspecified: Secondary | ICD-10-CM | POA: Diagnosis not present

## 2020-11-30 DIAGNOSIS — R1310 Dysphagia, unspecified: Secondary | ICD-10-CM | POA: Diagnosis not present

## 2020-11-30 DIAGNOSIS — I129 Hypertensive chronic kidney disease with stage 1 through stage 4 chronic kidney disease, or unspecified chronic kidney disease: Secondary | ICD-10-CM | POA: Diagnosis not present

## 2020-11-30 DIAGNOSIS — K222 Esophageal obstruction: Secondary | ICD-10-CM | POA: Diagnosis not present

## 2020-12-06 DIAGNOSIS — I4891 Unspecified atrial fibrillation: Secondary | ICD-10-CM | POA: Diagnosis not present

## 2020-12-06 DIAGNOSIS — R1310 Dysphagia, unspecified: Secondary | ICD-10-CM | POA: Diagnosis not present

## 2020-12-06 DIAGNOSIS — F32A Depression, unspecified: Secondary | ICD-10-CM | POA: Diagnosis not present

## 2020-12-06 DIAGNOSIS — K222 Esophageal obstruction: Secondary | ICD-10-CM | POA: Diagnosis not present

## 2020-12-06 DIAGNOSIS — N189 Chronic kidney disease, unspecified: Secondary | ICD-10-CM | POA: Diagnosis not present

## 2020-12-06 DIAGNOSIS — I129 Hypertensive chronic kidney disease with stage 1 through stage 4 chronic kidney disease, or unspecified chronic kidney disease: Secondary | ICD-10-CM | POA: Diagnosis not present

## 2020-12-07 DIAGNOSIS — I4891 Unspecified atrial fibrillation: Secondary | ICD-10-CM | POA: Diagnosis not present

## 2020-12-07 DIAGNOSIS — I129 Hypertensive chronic kidney disease with stage 1 through stage 4 chronic kidney disease, or unspecified chronic kidney disease: Secondary | ICD-10-CM | POA: Diagnosis not present

## 2020-12-07 DIAGNOSIS — F32A Depression, unspecified: Secondary | ICD-10-CM | POA: Diagnosis not present

## 2020-12-07 DIAGNOSIS — N189 Chronic kidney disease, unspecified: Secondary | ICD-10-CM | POA: Diagnosis not present

## 2020-12-07 DIAGNOSIS — R1310 Dysphagia, unspecified: Secondary | ICD-10-CM | POA: Diagnosis not present

## 2020-12-07 DIAGNOSIS — K222 Esophageal obstruction: Secondary | ICD-10-CM | POA: Diagnosis not present

## 2020-12-08 DIAGNOSIS — L603 Nail dystrophy: Secondary | ICD-10-CM | POA: Diagnosis not present

## 2020-12-08 DIAGNOSIS — L84 Corns and callosities: Secondary | ICD-10-CM | POA: Diagnosis not present

## 2020-12-08 DIAGNOSIS — I739 Peripheral vascular disease, unspecified: Secondary | ICD-10-CM | POA: Diagnosis not present

## 2020-12-12 DIAGNOSIS — I4891 Unspecified atrial fibrillation: Secondary | ICD-10-CM | POA: Diagnosis not present

## 2020-12-12 DIAGNOSIS — R1310 Dysphagia, unspecified: Secondary | ICD-10-CM | POA: Diagnosis not present

## 2020-12-12 DIAGNOSIS — N189 Chronic kidney disease, unspecified: Secondary | ICD-10-CM | POA: Diagnosis not present

## 2020-12-12 DIAGNOSIS — I129 Hypertensive chronic kidney disease with stage 1 through stage 4 chronic kidney disease, or unspecified chronic kidney disease: Secondary | ICD-10-CM | POA: Diagnosis not present

## 2020-12-12 DIAGNOSIS — F32A Depression, unspecified: Secondary | ICD-10-CM | POA: Diagnosis not present

## 2020-12-12 DIAGNOSIS — K222 Esophageal obstruction: Secondary | ICD-10-CM | POA: Diagnosis not present

## 2020-12-13 DIAGNOSIS — K222 Esophageal obstruction: Secondary | ICD-10-CM | POA: Diagnosis not present

## 2020-12-13 DIAGNOSIS — F32A Depression, unspecified: Secondary | ICD-10-CM | POA: Diagnosis not present

## 2020-12-13 DIAGNOSIS — I129 Hypertensive chronic kidney disease with stage 1 through stage 4 chronic kidney disease, or unspecified chronic kidney disease: Secondary | ICD-10-CM | POA: Diagnosis not present

## 2020-12-13 DIAGNOSIS — I4891 Unspecified atrial fibrillation: Secondary | ICD-10-CM | POA: Diagnosis not present

## 2020-12-13 DIAGNOSIS — N189 Chronic kidney disease, unspecified: Secondary | ICD-10-CM | POA: Diagnosis not present

## 2020-12-13 DIAGNOSIS — R1310 Dysphagia, unspecified: Secondary | ICD-10-CM | POA: Diagnosis not present

## 2020-12-19 DIAGNOSIS — R1310 Dysphagia, unspecified: Secondary | ICD-10-CM | POA: Diagnosis not present

## 2020-12-19 DIAGNOSIS — I129 Hypertensive chronic kidney disease with stage 1 through stage 4 chronic kidney disease, or unspecified chronic kidney disease: Secondary | ICD-10-CM | POA: Diagnosis not present

## 2020-12-19 DIAGNOSIS — N189 Chronic kidney disease, unspecified: Secondary | ICD-10-CM | POA: Diagnosis not present

## 2020-12-19 DIAGNOSIS — K222 Esophageal obstruction: Secondary | ICD-10-CM | POA: Diagnosis not present

## 2020-12-19 DIAGNOSIS — I4891 Unspecified atrial fibrillation: Secondary | ICD-10-CM | POA: Diagnosis not present

## 2020-12-19 DIAGNOSIS — F32A Depression, unspecified: Secondary | ICD-10-CM | POA: Diagnosis not present

## 2020-12-20 DIAGNOSIS — I129 Hypertensive chronic kidney disease with stage 1 through stage 4 chronic kidney disease, or unspecified chronic kidney disease: Secondary | ICD-10-CM | POA: Diagnosis not present

## 2020-12-20 DIAGNOSIS — K222 Esophageal obstruction: Secondary | ICD-10-CM | POA: Diagnosis not present

## 2020-12-20 DIAGNOSIS — N189 Chronic kidney disease, unspecified: Secondary | ICD-10-CM | POA: Diagnosis not present

## 2020-12-20 DIAGNOSIS — R1310 Dysphagia, unspecified: Secondary | ICD-10-CM | POA: Diagnosis not present

## 2020-12-20 DIAGNOSIS — I4891 Unspecified atrial fibrillation: Secondary | ICD-10-CM | POA: Diagnosis not present

## 2020-12-20 DIAGNOSIS — F32A Depression, unspecified: Secondary | ICD-10-CM | POA: Diagnosis not present

## 2020-12-21 DIAGNOSIS — F32A Depression, unspecified: Secondary | ICD-10-CM | POA: Diagnosis not present

## 2020-12-21 DIAGNOSIS — N189 Chronic kidney disease, unspecified: Secondary | ICD-10-CM | POA: Diagnosis not present

## 2020-12-21 DIAGNOSIS — K222 Esophageal obstruction: Secondary | ICD-10-CM | POA: Diagnosis not present

## 2020-12-21 DIAGNOSIS — I129 Hypertensive chronic kidney disease with stage 1 through stage 4 chronic kidney disease, or unspecified chronic kidney disease: Secondary | ICD-10-CM | POA: Diagnosis not present

## 2020-12-21 DIAGNOSIS — R1310 Dysphagia, unspecified: Secondary | ICD-10-CM | POA: Diagnosis not present

## 2020-12-21 DIAGNOSIS — I4891 Unspecified atrial fibrillation: Secondary | ICD-10-CM | POA: Diagnosis not present

## 2020-12-26 DIAGNOSIS — L89152 Pressure ulcer of sacral region, stage 2: Secondary | ICD-10-CM | POA: Diagnosis not present

## 2020-12-26 DIAGNOSIS — Z6820 Body mass index (BMI) 20.0-20.9, adult: Secondary | ICD-10-CM | POA: Diagnosis not present

## 2020-12-26 DIAGNOSIS — N189 Chronic kidney disease, unspecified: Secondary | ICD-10-CM | POA: Diagnosis not present

## 2020-12-26 DIAGNOSIS — F32A Depression, unspecified: Secondary | ICD-10-CM | POA: Diagnosis not present

## 2020-12-26 DIAGNOSIS — M3500 Sicca syndrome, unspecified: Secondary | ICD-10-CM | POA: Diagnosis not present

## 2020-12-26 DIAGNOSIS — K222 Esophageal obstruction: Secondary | ICD-10-CM | POA: Diagnosis not present

## 2020-12-26 DIAGNOSIS — I129 Hypertensive chronic kidney disease with stage 1 through stage 4 chronic kidney disease, or unspecified chronic kidney disease: Secondary | ICD-10-CM | POA: Diagnosis not present

## 2020-12-26 DIAGNOSIS — K219 Gastro-esophageal reflux disease without esophagitis: Secondary | ICD-10-CM | POA: Diagnosis not present

## 2020-12-26 DIAGNOSIS — R1310 Dysphagia, unspecified: Secondary | ICD-10-CM | POA: Diagnosis not present

## 2020-12-26 DIAGNOSIS — G2581 Restless legs syndrome: Secondary | ICD-10-CM | POA: Diagnosis not present

## 2020-12-26 DIAGNOSIS — I4891 Unspecified atrial fibrillation: Secondary | ICD-10-CM | POA: Diagnosis not present

## 2020-12-26 DIAGNOSIS — F0390 Unspecified dementia without behavioral disturbance: Secondary | ICD-10-CM | POA: Diagnosis not present

## 2020-12-27 DIAGNOSIS — I129 Hypertensive chronic kidney disease with stage 1 through stage 4 chronic kidney disease, or unspecified chronic kidney disease: Secondary | ICD-10-CM | POA: Diagnosis not present

## 2020-12-27 DIAGNOSIS — I4891 Unspecified atrial fibrillation: Secondary | ICD-10-CM | POA: Diagnosis not present

## 2020-12-27 DIAGNOSIS — R1310 Dysphagia, unspecified: Secondary | ICD-10-CM | POA: Diagnosis not present

## 2020-12-27 DIAGNOSIS — F32A Depression, unspecified: Secondary | ICD-10-CM | POA: Diagnosis not present

## 2020-12-27 DIAGNOSIS — K222 Esophageal obstruction: Secondary | ICD-10-CM | POA: Diagnosis not present

## 2020-12-27 DIAGNOSIS — N189 Chronic kidney disease, unspecified: Secondary | ICD-10-CM | POA: Diagnosis not present

## 2021-01-02 DIAGNOSIS — I4891 Unspecified atrial fibrillation: Secondary | ICD-10-CM | POA: Diagnosis not present

## 2021-01-02 DIAGNOSIS — R1310 Dysphagia, unspecified: Secondary | ICD-10-CM | POA: Diagnosis not present

## 2021-01-02 DIAGNOSIS — F32A Depression, unspecified: Secondary | ICD-10-CM | POA: Diagnosis not present

## 2021-01-02 DIAGNOSIS — N189 Chronic kidney disease, unspecified: Secondary | ICD-10-CM | POA: Diagnosis not present

## 2021-01-02 DIAGNOSIS — I129 Hypertensive chronic kidney disease with stage 1 through stage 4 chronic kidney disease, or unspecified chronic kidney disease: Secondary | ICD-10-CM | POA: Diagnosis not present

## 2021-01-02 DIAGNOSIS — K222 Esophageal obstruction: Secondary | ICD-10-CM | POA: Diagnosis not present

## 2021-01-03 DIAGNOSIS — I129 Hypertensive chronic kidney disease with stage 1 through stage 4 chronic kidney disease, or unspecified chronic kidney disease: Secondary | ICD-10-CM | POA: Diagnosis not present

## 2021-01-03 DIAGNOSIS — F32A Depression, unspecified: Secondary | ICD-10-CM | POA: Diagnosis not present

## 2021-01-03 DIAGNOSIS — R1310 Dysphagia, unspecified: Secondary | ICD-10-CM | POA: Diagnosis not present

## 2021-01-03 DIAGNOSIS — N189 Chronic kidney disease, unspecified: Secondary | ICD-10-CM | POA: Diagnosis not present

## 2021-01-03 DIAGNOSIS — I4891 Unspecified atrial fibrillation: Secondary | ICD-10-CM | POA: Diagnosis not present

## 2021-01-03 DIAGNOSIS — K222 Esophageal obstruction: Secondary | ICD-10-CM | POA: Diagnosis not present

## 2021-01-09 DIAGNOSIS — I129 Hypertensive chronic kidney disease with stage 1 through stage 4 chronic kidney disease, or unspecified chronic kidney disease: Secondary | ICD-10-CM | POA: Diagnosis not present

## 2021-01-09 DIAGNOSIS — N189 Chronic kidney disease, unspecified: Secondary | ICD-10-CM | POA: Diagnosis not present

## 2021-01-09 DIAGNOSIS — K222 Esophageal obstruction: Secondary | ICD-10-CM | POA: Diagnosis not present

## 2021-01-09 DIAGNOSIS — R1310 Dysphagia, unspecified: Secondary | ICD-10-CM | POA: Diagnosis not present

## 2021-01-09 DIAGNOSIS — I4891 Unspecified atrial fibrillation: Secondary | ICD-10-CM | POA: Diagnosis not present

## 2021-01-09 DIAGNOSIS — F32A Depression, unspecified: Secondary | ICD-10-CM | POA: Diagnosis not present

## 2021-01-10 DIAGNOSIS — N189 Chronic kidney disease, unspecified: Secondary | ICD-10-CM | POA: Diagnosis not present

## 2021-01-10 DIAGNOSIS — I4891 Unspecified atrial fibrillation: Secondary | ICD-10-CM | POA: Diagnosis not present

## 2021-01-10 DIAGNOSIS — K222 Esophageal obstruction: Secondary | ICD-10-CM | POA: Diagnosis not present

## 2021-01-10 DIAGNOSIS — F32A Depression, unspecified: Secondary | ICD-10-CM | POA: Diagnosis not present

## 2021-01-10 DIAGNOSIS — I129 Hypertensive chronic kidney disease with stage 1 through stage 4 chronic kidney disease, or unspecified chronic kidney disease: Secondary | ICD-10-CM | POA: Diagnosis not present

## 2021-01-10 DIAGNOSIS — R1310 Dysphagia, unspecified: Secondary | ICD-10-CM | POA: Diagnosis not present

## 2021-01-16 DIAGNOSIS — N189 Chronic kidney disease, unspecified: Secondary | ICD-10-CM | POA: Diagnosis not present

## 2021-01-16 DIAGNOSIS — F32A Depression, unspecified: Secondary | ICD-10-CM | POA: Diagnosis not present

## 2021-01-16 DIAGNOSIS — I4891 Unspecified atrial fibrillation: Secondary | ICD-10-CM | POA: Diagnosis not present

## 2021-01-16 DIAGNOSIS — I129 Hypertensive chronic kidney disease with stage 1 through stage 4 chronic kidney disease, or unspecified chronic kidney disease: Secondary | ICD-10-CM | POA: Diagnosis not present

## 2021-01-16 DIAGNOSIS — R1310 Dysphagia, unspecified: Secondary | ICD-10-CM | POA: Diagnosis not present

## 2021-01-16 DIAGNOSIS — K222 Esophageal obstruction: Secondary | ICD-10-CM | POA: Diagnosis not present

## 2021-01-17 DIAGNOSIS — I129 Hypertensive chronic kidney disease with stage 1 through stage 4 chronic kidney disease, or unspecified chronic kidney disease: Secondary | ICD-10-CM | POA: Diagnosis not present

## 2021-01-17 DIAGNOSIS — K222 Esophageal obstruction: Secondary | ICD-10-CM | POA: Diagnosis not present

## 2021-01-17 DIAGNOSIS — N189 Chronic kidney disease, unspecified: Secondary | ICD-10-CM | POA: Diagnosis not present

## 2021-01-17 DIAGNOSIS — F32A Depression, unspecified: Secondary | ICD-10-CM | POA: Diagnosis not present

## 2021-01-17 DIAGNOSIS — R1310 Dysphagia, unspecified: Secondary | ICD-10-CM | POA: Diagnosis not present

## 2021-01-17 DIAGNOSIS — I4891 Unspecified atrial fibrillation: Secondary | ICD-10-CM | POA: Diagnosis not present

## 2021-01-23 DIAGNOSIS — K222 Esophageal obstruction: Secondary | ICD-10-CM | POA: Diagnosis not present

## 2021-01-23 DIAGNOSIS — N189 Chronic kidney disease, unspecified: Secondary | ICD-10-CM | POA: Diagnosis not present

## 2021-01-23 DIAGNOSIS — I129 Hypertensive chronic kidney disease with stage 1 through stage 4 chronic kidney disease, or unspecified chronic kidney disease: Secondary | ICD-10-CM | POA: Diagnosis not present

## 2021-01-23 DIAGNOSIS — I4891 Unspecified atrial fibrillation: Secondary | ICD-10-CM | POA: Diagnosis not present

## 2021-01-23 DIAGNOSIS — R1310 Dysphagia, unspecified: Secondary | ICD-10-CM | POA: Diagnosis not present

## 2021-01-23 DIAGNOSIS — F32A Depression, unspecified: Secondary | ICD-10-CM | POA: Diagnosis not present

## 2021-01-24 DIAGNOSIS — R1310 Dysphagia, unspecified: Secondary | ICD-10-CM | POA: Diagnosis not present

## 2021-01-24 DIAGNOSIS — F32A Depression, unspecified: Secondary | ICD-10-CM | POA: Diagnosis not present

## 2021-01-24 DIAGNOSIS — I129 Hypertensive chronic kidney disease with stage 1 through stage 4 chronic kidney disease, or unspecified chronic kidney disease: Secondary | ICD-10-CM | POA: Diagnosis not present

## 2021-01-24 DIAGNOSIS — K222 Esophageal obstruction: Secondary | ICD-10-CM | POA: Diagnosis not present

## 2021-01-24 DIAGNOSIS — N189 Chronic kidney disease, unspecified: Secondary | ICD-10-CM | POA: Diagnosis not present

## 2021-01-24 DIAGNOSIS — I4891 Unspecified atrial fibrillation: Secondary | ICD-10-CM | POA: Diagnosis not present

## 2021-01-26 DIAGNOSIS — L89152 Pressure ulcer of sacral region, stage 2: Secondary | ICD-10-CM | POA: Diagnosis not present

## 2021-01-26 DIAGNOSIS — F0390 Unspecified dementia without behavioral disturbance: Secondary | ICD-10-CM | POA: Diagnosis not present

## 2021-01-26 DIAGNOSIS — I4891 Unspecified atrial fibrillation: Secondary | ICD-10-CM | POA: Diagnosis not present

## 2021-01-26 DIAGNOSIS — R159 Full incontinence of feces: Secondary | ICD-10-CM | POA: Diagnosis not present

## 2021-01-26 DIAGNOSIS — M3500 Sicca syndrome, unspecified: Secondary | ICD-10-CM | POA: Diagnosis not present

## 2021-01-26 DIAGNOSIS — R32 Unspecified urinary incontinence: Secondary | ICD-10-CM | POA: Diagnosis not present

## 2021-01-26 DIAGNOSIS — I129 Hypertensive chronic kidney disease with stage 1 through stage 4 chronic kidney disease, or unspecified chronic kidney disease: Secondary | ICD-10-CM | POA: Diagnosis not present

## 2021-01-26 DIAGNOSIS — K219 Gastro-esophageal reflux disease without esophagitis: Secondary | ICD-10-CM | POA: Diagnosis not present

## 2021-01-26 DIAGNOSIS — R1310 Dysphagia, unspecified: Secondary | ICD-10-CM | POA: Diagnosis not present

## 2021-01-26 DIAGNOSIS — Z6820 Body mass index (BMI) 20.0-20.9, adult: Secondary | ICD-10-CM | POA: Diagnosis not present

## 2021-01-26 DIAGNOSIS — K222 Esophageal obstruction: Secondary | ICD-10-CM | POA: Diagnosis not present

## 2021-01-26 DIAGNOSIS — G2581 Restless legs syndrome: Secondary | ICD-10-CM | POA: Diagnosis not present

## 2021-01-26 DIAGNOSIS — F32A Depression, unspecified: Secondary | ICD-10-CM | POA: Diagnosis not present

## 2021-01-26 DIAGNOSIS — N189 Chronic kidney disease, unspecified: Secondary | ICD-10-CM | POA: Diagnosis not present

## 2021-01-29 DIAGNOSIS — I4891 Unspecified atrial fibrillation: Secondary | ICD-10-CM | POA: Diagnosis not present

## 2021-01-29 DIAGNOSIS — I129 Hypertensive chronic kidney disease with stage 1 through stage 4 chronic kidney disease, or unspecified chronic kidney disease: Secondary | ICD-10-CM | POA: Diagnosis not present

## 2021-01-29 DIAGNOSIS — F32A Depression, unspecified: Secondary | ICD-10-CM | POA: Diagnosis not present

## 2021-01-29 DIAGNOSIS — R1310 Dysphagia, unspecified: Secondary | ICD-10-CM | POA: Diagnosis not present

## 2021-01-29 DIAGNOSIS — K222 Esophageal obstruction: Secondary | ICD-10-CM | POA: Diagnosis not present

## 2021-01-29 DIAGNOSIS — N189 Chronic kidney disease, unspecified: Secondary | ICD-10-CM | POA: Diagnosis not present

## 2021-01-30 DIAGNOSIS — K222 Esophageal obstruction: Secondary | ICD-10-CM | POA: Diagnosis not present

## 2021-01-30 DIAGNOSIS — R1310 Dysphagia, unspecified: Secondary | ICD-10-CM | POA: Diagnosis not present

## 2021-01-30 DIAGNOSIS — I4891 Unspecified atrial fibrillation: Secondary | ICD-10-CM | POA: Diagnosis not present

## 2021-01-30 DIAGNOSIS — I129 Hypertensive chronic kidney disease with stage 1 through stage 4 chronic kidney disease, or unspecified chronic kidney disease: Secondary | ICD-10-CM | POA: Diagnosis not present

## 2021-01-30 DIAGNOSIS — F32A Depression, unspecified: Secondary | ICD-10-CM | POA: Diagnosis not present

## 2021-01-30 DIAGNOSIS — N189 Chronic kidney disease, unspecified: Secondary | ICD-10-CM | POA: Diagnosis not present

## 2021-02-06 DIAGNOSIS — R1310 Dysphagia, unspecified: Secondary | ICD-10-CM | POA: Diagnosis not present

## 2021-02-06 DIAGNOSIS — I4891 Unspecified atrial fibrillation: Secondary | ICD-10-CM | POA: Diagnosis not present

## 2021-02-06 DIAGNOSIS — F32A Depression, unspecified: Secondary | ICD-10-CM | POA: Diagnosis not present

## 2021-02-06 DIAGNOSIS — K222 Esophageal obstruction: Secondary | ICD-10-CM | POA: Diagnosis not present

## 2021-02-06 DIAGNOSIS — I129 Hypertensive chronic kidney disease with stage 1 through stage 4 chronic kidney disease, or unspecified chronic kidney disease: Secondary | ICD-10-CM | POA: Diagnosis not present

## 2021-02-06 DIAGNOSIS — N189 Chronic kidney disease, unspecified: Secondary | ICD-10-CM | POA: Diagnosis not present

## 2021-02-07 DIAGNOSIS — F32A Depression, unspecified: Secondary | ICD-10-CM | POA: Diagnosis not present

## 2021-02-07 DIAGNOSIS — K222 Esophageal obstruction: Secondary | ICD-10-CM | POA: Diagnosis not present

## 2021-02-07 DIAGNOSIS — R1310 Dysphagia, unspecified: Secondary | ICD-10-CM | POA: Diagnosis not present

## 2021-02-07 DIAGNOSIS — I129 Hypertensive chronic kidney disease with stage 1 through stage 4 chronic kidney disease, or unspecified chronic kidney disease: Secondary | ICD-10-CM | POA: Diagnosis not present

## 2021-02-07 DIAGNOSIS — N189 Chronic kidney disease, unspecified: Secondary | ICD-10-CM | POA: Diagnosis not present

## 2021-02-07 DIAGNOSIS — I4891 Unspecified atrial fibrillation: Secondary | ICD-10-CM | POA: Diagnosis not present

## 2021-02-13 DIAGNOSIS — K222 Esophageal obstruction: Secondary | ICD-10-CM | POA: Diagnosis not present

## 2021-02-13 DIAGNOSIS — N189 Chronic kidney disease, unspecified: Secondary | ICD-10-CM | POA: Diagnosis not present

## 2021-02-13 DIAGNOSIS — I4891 Unspecified atrial fibrillation: Secondary | ICD-10-CM | POA: Diagnosis not present

## 2021-02-13 DIAGNOSIS — R1310 Dysphagia, unspecified: Secondary | ICD-10-CM | POA: Diagnosis not present

## 2021-02-13 DIAGNOSIS — F32A Depression, unspecified: Secondary | ICD-10-CM | POA: Diagnosis not present

## 2021-02-13 DIAGNOSIS — I129 Hypertensive chronic kidney disease with stage 1 through stage 4 chronic kidney disease, or unspecified chronic kidney disease: Secondary | ICD-10-CM | POA: Diagnosis not present

## 2021-02-14 DIAGNOSIS — N189 Chronic kidney disease, unspecified: Secondary | ICD-10-CM | POA: Diagnosis not present

## 2021-02-14 DIAGNOSIS — F32A Depression, unspecified: Secondary | ICD-10-CM | POA: Diagnosis not present

## 2021-02-14 DIAGNOSIS — I4891 Unspecified atrial fibrillation: Secondary | ICD-10-CM | POA: Diagnosis not present

## 2021-02-14 DIAGNOSIS — I129 Hypertensive chronic kidney disease with stage 1 through stage 4 chronic kidney disease, or unspecified chronic kidney disease: Secondary | ICD-10-CM | POA: Diagnosis not present

## 2021-02-14 DIAGNOSIS — K222 Esophageal obstruction: Secondary | ICD-10-CM | POA: Diagnosis not present

## 2021-02-14 DIAGNOSIS — R1310 Dysphagia, unspecified: Secondary | ICD-10-CM | POA: Diagnosis not present

## 2021-02-20 DIAGNOSIS — K222 Esophageal obstruction: Secondary | ICD-10-CM | POA: Diagnosis not present

## 2021-02-20 DIAGNOSIS — I129 Hypertensive chronic kidney disease with stage 1 through stage 4 chronic kidney disease, or unspecified chronic kidney disease: Secondary | ICD-10-CM | POA: Diagnosis not present

## 2021-02-20 DIAGNOSIS — R1310 Dysphagia, unspecified: Secondary | ICD-10-CM | POA: Diagnosis not present

## 2021-02-20 DIAGNOSIS — N189 Chronic kidney disease, unspecified: Secondary | ICD-10-CM | POA: Diagnosis not present

## 2021-02-20 DIAGNOSIS — F32A Depression, unspecified: Secondary | ICD-10-CM | POA: Diagnosis not present

## 2021-02-20 DIAGNOSIS — I4891 Unspecified atrial fibrillation: Secondary | ICD-10-CM | POA: Diagnosis not present

## 2021-02-21 DIAGNOSIS — K222 Esophageal obstruction: Secondary | ICD-10-CM | POA: Diagnosis not present

## 2021-02-21 DIAGNOSIS — R1310 Dysphagia, unspecified: Secondary | ICD-10-CM | POA: Diagnosis not present

## 2021-02-21 DIAGNOSIS — I4891 Unspecified atrial fibrillation: Secondary | ICD-10-CM | POA: Diagnosis not present

## 2021-02-21 DIAGNOSIS — I129 Hypertensive chronic kidney disease with stage 1 through stage 4 chronic kidney disease, or unspecified chronic kidney disease: Secondary | ICD-10-CM | POA: Diagnosis not present

## 2021-02-21 DIAGNOSIS — N189 Chronic kidney disease, unspecified: Secondary | ICD-10-CM | POA: Diagnosis not present

## 2021-02-21 DIAGNOSIS — F32A Depression, unspecified: Secondary | ICD-10-CM | POA: Diagnosis not present

## 2021-02-25 DIAGNOSIS — L89152 Pressure ulcer of sacral region, stage 2: Secondary | ICD-10-CM | POA: Diagnosis not present

## 2021-02-25 DIAGNOSIS — Z6820 Body mass index (BMI) 20.0-20.9, adult: Secondary | ICD-10-CM | POA: Diagnosis not present

## 2021-02-25 DIAGNOSIS — F0390 Unspecified dementia without behavioral disturbance: Secondary | ICD-10-CM | POA: Diagnosis not present

## 2021-02-25 DIAGNOSIS — R32 Unspecified urinary incontinence: Secondary | ICD-10-CM | POA: Diagnosis not present

## 2021-02-25 DIAGNOSIS — N189 Chronic kidney disease, unspecified: Secondary | ICD-10-CM | POA: Diagnosis not present

## 2021-02-25 DIAGNOSIS — F32A Depression, unspecified: Secondary | ICD-10-CM | POA: Diagnosis not present

## 2021-02-25 DIAGNOSIS — K219 Gastro-esophageal reflux disease without esophagitis: Secondary | ICD-10-CM | POA: Diagnosis not present

## 2021-02-25 DIAGNOSIS — I4891 Unspecified atrial fibrillation: Secondary | ICD-10-CM | POA: Diagnosis not present

## 2021-02-25 DIAGNOSIS — I129 Hypertensive chronic kidney disease with stage 1 through stage 4 chronic kidney disease, or unspecified chronic kidney disease: Secondary | ICD-10-CM | POA: Diagnosis not present

## 2021-02-25 DIAGNOSIS — K222 Esophageal obstruction: Secondary | ICD-10-CM | POA: Diagnosis not present

## 2021-02-25 DIAGNOSIS — G2581 Restless legs syndrome: Secondary | ICD-10-CM | POA: Diagnosis not present

## 2021-02-25 DIAGNOSIS — R1310 Dysphagia, unspecified: Secondary | ICD-10-CM | POA: Diagnosis not present

## 2021-02-25 DIAGNOSIS — M3500 Sicca syndrome, unspecified: Secondary | ICD-10-CM | POA: Diagnosis not present

## 2021-02-25 DIAGNOSIS — R159 Full incontinence of feces: Secondary | ICD-10-CM | POA: Diagnosis not present

## 2021-02-27 DIAGNOSIS — K222 Esophageal obstruction: Secondary | ICD-10-CM | POA: Diagnosis not present

## 2021-02-27 DIAGNOSIS — N189 Chronic kidney disease, unspecified: Secondary | ICD-10-CM | POA: Diagnosis not present

## 2021-02-27 DIAGNOSIS — I4891 Unspecified atrial fibrillation: Secondary | ICD-10-CM | POA: Diagnosis not present

## 2021-02-27 DIAGNOSIS — R1310 Dysphagia, unspecified: Secondary | ICD-10-CM | POA: Diagnosis not present

## 2021-02-27 DIAGNOSIS — F32A Depression, unspecified: Secondary | ICD-10-CM | POA: Diagnosis not present

## 2021-02-27 DIAGNOSIS — I129 Hypertensive chronic kidney disease with stage 1 through stage 4 chronic kidney disease, or unspecified chronic kidney disease: Secondary | ICD-10-CM | POA: Diagnosis not present

## 2021-02-28 DIAGNOSIS — I4891 Unspecified atrial fibrillation: Secondary | ICD-10-CM | POA: Diagnosis not present

## 2021-02-28 DIAGNOSIS — N189 Chronic kidney disease, unspecified: Secondary | ICD-10-CM | POA: Diagnosis not present

## 2021-02-28 DIAGNOSIS — I129 Hypertensive chronic kidney disease with stage 1 through stage 4 chronic kidney disease, or unspecified chronic kidney disease: Secondary | ICD-10-CM | POA: Diagnosis not present

## 2021-02-28 DIAGNOSIS — F32A Depression, unspecified: Secondary | ICD-10-CM | POA: Diagnosis not present

## 2021-02-28 DIAGNOSIS — R1310 Dysphagia, unspecified: Secondary | ICD-10-CM | POA: Diagnosis not present

## 2021-02-28 DIAGNOSIS — K222 Esophageal obstruction: Secondary | ICD-10-CM | POA: Diagnosis not present

## 2021-03-06 DIAGNOSIS — R1310 Dysphagia, unspecified: Secondary | ICD-10-CM | POA: Diagnosis not present

## 2021-03-06 DIAGNOSIS — I4891 Unspecified atrial fibrillation: Secondary | ICD-10-CM | POA: Diagnosis not present

## 2021-03-06 DIAGNOSIS — F32A Depression, unspecified: Secondary | ICD-10-CM | POA: Diagnosis not present

## 2021-03-06 DIAGNOSIS — N189 Chronic kidney disease, unspecified: Secondary | ICD-10-CM | POA: Diagnosis not present

## 2021-03-06 DIAGNOSIS — K222 Esophageal obstruction: Secondary | ICD-10-CM | POA: Diagnosis not present

## 2021-03-06 DIAGNOSIS — I129 Hypertensive chronic kidney disease with stage 1 through stage 4 chronic kidney disease, or unspecified chronic kidney disease: Secondary | ICD-10-CM | POA: Diagnosis not present

## 2021-03-07 DIAGNOSIS — F32A Depression, unspecified: Secondary | ICD-10-CM | POA: Diagnosis not present

## 2021-03-07 DIAGNOSIS — I4891 Unspecified atrial fibrillation: Secondary | ICD-10-CM | POA: Diagnosis not present

## 2021-03-07 DIAGNOSIS — K222 Esophageal obstruction: Secondary | ICD-10-CM | POA: Diagnosis not present

## 2021-03-07 DIAGNOSIS — N189 Chronic kidney disease, unspecified: Secondary | ICD-10-CM | POA: Diagnosis not present

## 2021-03-07 DIAGNOSIS — R1310 Dysphagia, unspecified: Secondary | ICD-10-CM | POA: Diagnosis not present

## 2021-03-07 DIAGNOSIS — I129 Hypertensive chronic kidney disease with stage 1 through stage 4 chronic kidney disease, or unspecified chronic kidney disease: Secondary | ICD-10-CM | POA: Diagnosis not present

## 2021-03-08 DIAGNOSIS — K222 Esophageal obstruction: Secondary | ICD-10-CM | POA: Diagnosis not present

## 2021-03-08 DIAGNOSIS — I4891 Unspecified atrial fibrillation: Secondary | ICD-10-CM | POA: Diagnosis not present

## 2021-03-08 DIAGNOSIS — R1310 Dysphagia, unspecified: Secondary | ICD-10-CM | POA: Diagnosis not present

## 2021-03-08 DIAGNOSIS — F32A Depression, unspecified: Secondary | ICD-10-CM | POA: Diagnosis not present

## 2021-03-08 DIAGNOSIS — N189 Chronic kidney disease, unspecified: Secondary | ICD-10-CM | POA: Diagnosis not present

## 2021-03-08 DIAGNOSIS — I129 Hypertensive chronic kidney disease with stage 1 through stage 4 chronic kidney disease, or unspecified chronic kidney disease: Secondary | ICD-10-CM | POA: Diagnosis not present

## 2021-03-09 DIAGNOSIS — I4891 Unspecified atrial fibrillation: Secondary | ICD-10-CM | POA: Diagnosis not present

## 2021-03-09 DIAGNOSIS — K222 Esophageal obstruction: Secondary | ICD-10-CM | POA: Diagnosis not present

## 2021-03-09 DIAGNOSIS — R1310 Dysphagia, unspecified: Secondary | ICD-10-CM | POA: Diagnosis not present

## 2021-03-09 DIAGNOSIS — N189 Chronic kidney disease, unspecified: Secondary | ICD-10-CM | POA: Diagnosis not present

## 2021-03-09 DIAGNOSIS — I129 Hypertensive chronic kidney disease with stage 1 through stage 4 chronic kidney disease, or unspecified chronic kidney disease: Secondary | ICD-10-CM | POA: Diagnosis not present

## 2021-03-09 DIAGNOSIS — F32A Depression, unspecified: Secondary | ICD-10-CM | POA: Diagnosis not present

## 2021-03-12 DIAGNOSIS — R1310 Dysphagia, unspecified: Secondary | ICD-10-CM | POA: Diagnosis not present

## 2021-03-12 DIAGNOSIS — I4891 Unspecified atrial fibrillation: Secondary | ICD-10-CM | POA: Diagnosis not present

## 2021-03-12 DIAGNOSIS — N189 Chronic kidney disease, unspecified: Secondary | ICD-10-CM | POA: Diagnosis not present

## 2021-03-12 DIAGNOSIS — F32A Depression, unspecified: Secondary | ICD-10-CM | POA: Diagnosis not present

## 2021-03-12 DIAGNOSIS — K222 Esophageal obstruction: Secondary | ICD-10-CM | POA: Diagnosis not present

## 2021-03-12 DIAGNOSIS — I129 Hypertensive chronic kidney disease with stage 1 through stage 4 chronic kidney disease, or unspecified chronic kidney disease: Secondary | ICD-10-CM | POA: Diagnosis not present

## 2021-03-13 DIAGNOSIS — F32A Depression, unspecified: Secondary | ICD-10-CM | POA: Diagnosis not present

## 2021-03-13 DIAGNOSIS — N189 Chronic kidney disease, unspecified: Secondary | ICD-10-CM | POA: Diagnosis not present

## 2021-03-13 DIAGNOSIS — I129 Hypertensive chronic kidney disease with stage 1 through stage 4 chronic kidney disease, or unspecified chronic kidney disease: Secondary | ICD-10-CM | POA: Diagnosis not present

## 2021-03-13 DIAGNOSIS — B351 Tinea unguium: Secondary | ICD-10-CM | POA: Diagnosis not present

## 2021-03-13 DIAGNOSIS — M2041 Other hammer toe(s) (acquired), right foot: Secondary | ICD-10-CM | POA: Diagnosis not present

## 2021-03-13 DIAGNOSIS — R1310 Dysphagia, unspecified: Secondary | ICD-10-CM | POA: Diagnosis not present

## 2021-03-13 DIAGNOSIS — L84 Corns and callosities: Secondary | ICD-10-CM | POA: Diagnosis not present

## 2021-03-13 DIAGNOSIS — I4891 Unspecified atrial fibrillation: Secondary | ICD-10-CM | POA: Diagnosis not present

## 2021-03-13 DIAGNOSIS — K222 Esophageal obstruction: Secondary | ICD-10-CM | POA: Diagnosis not present

## 2021-03-14 DIAGNOSIS — R1310 Dysphagia, unspecified: Secondary | ICD-10-CM | POA: Diagnosis not present

## 2021-03-14 DIAGNOSIS — F32A Depression, unspecified: Secondary | ICD-10-CM | POA: Diagnosis not present

## 2021-03-14 DIAGNOSIS — I129 Hypertensive chronic kidney disease with stage 1 through stage 4 chronic kidney disease, or unspecified chronic kidney disease: Secondary | ICD-10-CM | POA: Diagnosis not present

## 2021-03-14 DIAGNOSIS — K222 Esophageal obstruction: Secondary | ICD-10-CM | POA: Diagnosis not present

## 2021-03-14 DIAGNOSIS — N189 Chronic kidney disease, unspecified: Secondary | ICD-10-CM | POA: Diagnosis not present

## 2021-03-14 DIAGNOSIS — I4891 Unspecified atrial fibrillation: Secondary | ICD-10-CM | POA: Diagnosis not present

## 2021-03-15 DIAGNOSIS — R1310 Dysphagia, unspecified: Secondary | ICD-10-CM | POA: Diagnosis not present

## 2021-03-15 DIAGNOSIS — N189 Chronic kidney disease, unspecified: Secondary | ICD-10-CM | POA: Diagnosis not present

## 2021-03-15 DIAGNOSIS — K222 Esophageal obstruction: Secondary | ICD-10-CM | POA: Diagnosis not present

## 2021-03-15 DIAGNOSIS — I129 Hypertensive chronic kidney disease with stage 1 through stage 4 chronic kidney disease, or unspecified chronic kidney disease: Secondary | ICD-10-CM | POA: Diagnosis not present

## 2021-03-15 DIAGNOSIS — I4891 Unspecified atrial fibrillation: Secondary | ICD-10-CM | POA: Diagnosis not present

## 2021-03-15 DIAGNOSIS — F32A Depression, unspecified: Secondary | ICD-10-CM | POA: Diagnosis not present

## 2021-03-19 DIAGNOSIS — N189 Chronic kidney disease, unspecified: Secondary | ICD-10-CM | POA: Diagnosis not present

## 2021-03-19 DIAGNOSIS — R1310 Dysphagia, unspecified: Secondary | ICD-10-CM | POA: Diagnosis not present

## 2021-03-19 DIAGNOSIS — I129 Hypertensive chronic kidney disease with stage 1 through stage 4 chronic kidney disease, or unspecified chronic kidney disease: Secondary | ICD-10-CM | POA: Diagnosis not present

## 2021-03-19 DIAGNOSIS — I4891 Unspecified atrial fibrillation: Secondary | ICD-10-CM | POA: Diagnosis not present

## 2021-03-19 DIAGNOSIS — K222 Esophageal obstruction: Secondary | ICD-10-CM | POA: Diagnosis not present

## 2021-03-19 DIAGNOSIS — F32A Depression, unspecified: Secondary | ICD-10-CM | POA: Diagnosis not present

## 2021-03-22 DIAGNOSIS — N189 Chronic kidney disease, unspecified: Secondary | ICD-10-CM | POA: Diagnosis not present

## 2021-03-22 DIAGNOSIS — I4891 Unspecified atrial fibrillation: Secondary | ICD-10-CM | POA: Diagnosis not present

## 2021-03-22 DIAGNOSIS — K222 Esophageal obstruction: Secondary | ICD-10-CM | POA: Diagnosis not present

## 2021-03-22 DIAGNOSIS — I129 Hypertensive chronic kidney disease with stage 1 through stage 4 chronic kidney disease, or unspecified chronic kidney disease: Secondary | ICD-10-CM | POA: Diagnosis not present

## 2021-03-22 DIAGNOSIS — R1310 Dysphagia, unspecified: Secondary | ICD-10-CM | POA: Diagnosis not present

## 2021-03-22 DIAGNOSIS — F32A Depression, unspecified: Secondary | ICD-10-CM | POA: Diagnosis not present

## 2021-03-26 DIAGNOSIS — K222 Esophageal obstruction: Secondary | ICD-10-CM | POA: Diagnosis not present

## 2021-03-26 DIAGNOSIS — I4891 Unspecified atrial fibrillation: Secondary | ICD-10-CM | POA: Diagnosis not present

## 2021-03-26 DIAGNOSIS — R1310 Dysphagia, unspecified: Secondary | ICD-10-CM | POA: Diagnosis not present

## 2021-03-26 DIAGNOSIS — F32A Depression, unspecified: Secondary | ICD-10-CM | POA: Diagnosis not present

## 2021-03-26 DIAGNOSIS — N189 Chronic kidney disease, unspecified: Secondary | ICD-10-CM | POA: Diagnosis not present

## 2021-03-26 DIAGNOSIS — I129 Hypertensive chronic kidney disease with stage 1 through stage 4 chronic kidney disease, or unspecified chronic kidney disease: Secondary | ICD-10-CM | POA: Diagnosis not present

## 2021-03-27 DIAGNOSIS — R1310 Dysphagia, unspecified: Secondary | ICD-10-CM | POA: Diagnosis not present

## 2021-03-27 DIAGNOSIS — K222 Esophageal obstruction: Secondary | ICD-10-CM | POA: Diagnosis not present

## 2021-03-27 DIAGNOSIS — I4891 Unspecified atrial fibrillation: Secondary | ICD-10-CM | POA: Diagnosis not present

## 2021-03-27 DIAGNOSIS — I129 Hypertensive chronic kidney disease with stage 1 through stage 4 chronic kidney disease, or unspecified chronic kidney disease: Secondary | ICD-10-CM | POA: Diagnosis not present

## 2021-03-27 DIAGNOSIS — N189 Chronic kidney disease, unspecified: Secondary | ICD-10-CM | POA: Diagnosis not present

## 2021-03-27 DIAGNOSIS — F32A Depression, unspecified: Secondary | ICD-10-CM | POA: Diagnosis not present

## 2021-03-28 DIAGNOSIS — F0390 Unspecified dementia without behavioral disturbance: Secondary | ICD-10-CM | POA: Diagnosis not present

## 2021-03-28 DIAGNOSIS — N189 Chronic kidney disease, unspecified: Secondary | ICD-10-CM | POA: Diagnosis not present

## 2021-03-28 DIAGNOSIS — R1310 Dysphagia, unspecified: Secondary | ICD-10-CM | POA: Diagnosis not present

## 2021-03-28 DIAGNOSIS — M3500 Sicca syndrome, unspecified: Secondary | ICD-10-CM | POA: Diagnosis not present

## 2021-03-28 DIAGNOSIS — K222 Esophageal obstruction: Secondary | ICD-10-CM | POA: Diagnosis not present

## 2021-03-28 DIAGNOSIS — R32 Unspecified urinary incontinence: Secondary | ICD-10-CM | POA: Diagnosis not present

## 2021-03-28 DIAGNOSIS — I4891 Unspecified atrial fibrillation: Secondary | ICD-10-CM | POA: Diagnosis not present

## 2021-03-28 DIAGNOSIS — R441 Visual hallucinations: Secondary | ICD-10-CM | POA: Diagnosis not present

## 2021-03-28 DIAGNOSIS — R159 Full incontinence of feces: Secondary | ICD-10-CM | POA: Diagnosis not present

## 2021-03-28 DIAGNOSIS — G2581 Restless legs syndrome: Secondary | ICD-10-CM | POA: Diagnosis not present

## 2021-03-28 DIAGNOSIS — I129 Hypertensive chronic kidney disease with stage 1 through stage 4 chronic kidney disease, or unspecified chronic kidney disease: Secondary | ICD-10-CM | POA: Diagnosis not present

## 2021-03-28 DIAGNOSIS — K219 Gastro-esophageal reflux disease without esophagitis: Secondary | ICD-10-CM | POA: Diagnosis not present

## 2021-03-28 DIAGNOSIS — Z6820 Body mass index (BMI) 20.0-20.9, adult: Secondary | ICD-10-CM | POA: Diagnosis not present

## 2021-03-28 DIAGNOSIS — F32A Depression, unspecified: Secondary | ICD-10-CM | POA: Diagnosis not present

## 2021-03-28 DIAGNOSIS — L89152 Pressure ulcer of sacral region, stage 2: Secondary | ICD-10-CM | POA: Diagnosis not present

## 2021-03-29 DIAGNOSIS — I4891 Unspecified atrial fibrillation: Secondary | ICD-10-CM | POA: Diagnosis not present

## 2021-03-29 DIAGNOSIS — K222 Esophageal obstruction: Secondary | ICD-10-CM | POA: Diagnosis not present

## 2021-03-29 DIAGNOSIS — I129 Hypertensive chronic kidney disease with stage 1 through stage 4 chronic kidney disease, or unspecified chronic kidney disease: Secondary | ICD-10-CM | POA: Diagnosis not present

## 2021-03-29 DIAGNOSIS — R1310 Dysphagia, unspecified: Secondary | ICD-10-CM | POA: Diagnosis not present

## 2021-03-29 DIAGNOSIS — F32A Depression, unspecified: Secondary | ICD-10-CM | POA: Diagnosis not present

## 2021-03-29 DIAGNOSIS — N189 Chronic kidney disease, unspecified: Secondary | ICD-10-CM | POA: Diagnosis not present

## 2021-03-30 DIAGNOSIS — F32A Depression, unspecified: Secondary | ICD-10-CM | POA: Diagnosis not present

## 2021-03-30 DIAGNOSIS — I4891 Unspecified atrial fibrillation: Secondary | ICD-10-CM | POA: Diagnosis not present

## 2021-03-30 DIAGNOSIS — R1310 Dysphagia, unspecified: Secondary | ICD-10-CM | POA: Diagnosis not present

## 2021-03-30 DIAGNOSIS — K222 Esophageal obstruction: Secondary | ICD-10-CM | POA: Diagnosis not present

## 2021-03-30 DIAGNOSIS — I129 Hypertensive chronic kidney disease with stage 1 through stage 4 chronic kidney disease, or unspecified chronic kidney disease: Secondary | ICD-10-CM | POA: Diagnosis not present

## 2021-03-30 DIAGNOSIS — N189 Chronic kidney disease, unspecified: Secondary | ICD-10-CM | POA: Diagnosis not present

## 2021-04-02 DIAGNOSIS — F32A Depression, unspecified: Secondary | ICD-10-CM | POA: Diagnosis not present

## 2021-04-02 DIAGNOSIS — I129 Hypertensive chronic kidney disease with stage 1 through stage 4 chronic kidney disease, or unspecified chronic kidney disease: Secondary | ICD-10-CM | POA: Diagnosis not present

## 2021-04-02 DIAGNOSIS — K222 Esophageal obstruction: Secondary | ICD-10-CM | POA: Diagnosis not present

## 2021-04-02 DIAGNOSIS — N189 Chronic kidney disease, unspecified: Secondary | ICD-10-CM | POA: Diagnosis not present

## 2021-04-02 DIAGNOSIS — I4891 Unspecified atrial fibrillation: Secondary | ICD-10-CM | POA: Diagnosis not present

## 2021-04-02 DIAGNOSIS — R1310 Dysphagia, unspecified: Secondary | ICD-10-CM | POA: Diagnosis not present

## 2021-04-03 DIAGNOSIS — I4891 Unspecified atrial fibrillation: Secondary | ICD-10-CM | POA: Diagnosis not present

## 2021-04-03 DIAGNOSIS — K222 Esophageal obstruction: Secondary | ICD-10-CM | POA: Diagnosis not present

## 2021-04-03 DIAGNOSIS — F32A Depression, unspecified: Secondary | ICD-10-CM | POA: Diagnosis not present

## 2021-04-03 DIAGNOSIS — R1310 Dysphagia, unspecified: Secondary | ICD-10-CM | POA: Diagnosis not present

## 2021-04-03 DIAGNOSIS — N189 Chronic kidney disease, unspecified: Secondary | ICD-10-CM | POA: Diagnosis not present

## 2021-04-03 DIAGNOSIS — I129 Hypertensive chronic kidney disease with stage 1 through stage 4 chronic kidney disease, or unspecified chronic kidney disease: Secondary | ICD-10-CM | POA: Diagnosis not present

## 2021-04-05 DIAGNOSIS — K222 Esophageal obstruction: Secondary | ICD-10-CM | POA: Diagnosis not present

## 2021-04-05 DIAGNOSIS — N189 Chronic kidney disease, unspecified: Secondary | ICD-10-CM | POA: Diagnosis not present

## 2021-04-05 DIAGNOSIS — F32A Depression, unspecified: Secondary | ICD-10-CM | POA: Diagnosis not present

## 2021-04-05 DIAGNOSIS — R1310 Dysphagia, unspecified: Secondary | ICD-10-CM | POA: Diagnosis not present

## 2021-04-05 DIAGNOSIS — I4891 Unspecified atrial fibrillation: Secondary | ICD-10-CM | POA: Diagnosis not present

## 2021-04-05 DIAGNOSIS — I129 Hypertensive chronic kidney disease with stage 1 through stage 4 chronic kidney disease, or unspecified chronic kidney disease: Secondary | ICD-10-CM | POA: Diagnosis not present

## 2021-04-09 DIAGNOSIS — R1310 Dysphagia, unspecified: Secondary | ICD-10-CM | POA: Diagnosis not present

## 2021-04-09 DIAGNOSIS — F32A Depression, unspecified: Secondary | ICD-10-CM | POA: Diagnosis not present

## 2021-04-09 DIAGNOSIS — K222 Esophageal obstruction: Secondary | ICD-10-CM | POA: Diagnosis not present

## 2021-04-09 DIAGNOSIS — I129 Hypertensive chronic kidney disease with stage 1 through stage 4 chronic kidney disease, or unspecified chronic kidney disease: Secondary | ICD-10-CM | POA: Diagnosis not present

## 2021-04-09 DIAGNOSIS — I4891 Unspecified atrial fibrillation: Secondary | ICD-10-CM | POA: Diagnosis not present

## 2021-04-09 DIAGNOSIS — N189 Chronic kidney disease, unspecified: Secondary | ICD-10-CM | POA: Diagnosis not present

## 2021-04-10 DIAGNOSIS — K222 Esophageal obstruction: Secondary | ICD-10-CM | POA: Diagnosis not present

## 2021-04-10 DIAGNOSIS — F32A Depression, unspecified: Secondary | ICD-10-CM | POA: Diagnosis not present

## 2021-04-10 DIAGNOSIS — I129 Hypertensive chronic kidney disease with stage 1 through stage 4 chronic kidney disease, or unspecified chronic kidney disease: Secondary | ICD-10-CM | POA: Diagnosis not present

## 2021-04-10 DIAGNOSIS — N189 Chronic kidney disease, unspecified: Secondary | ICD-10-CM | POA: Diagnosis not present

## 2021-04-10 DIAGNOSIS — I4891 Unspecified atrial fibrillation: Secondary | ICD-10-CM | POA: Diagnosis not present

## 2021-04-10 DIAGNOSIS — R1310 Dysphagia, unspecified: Secondary | ICD-10-CM | POA: Diagnosis not present

## 2021-04-11 DIAGNOSIS — I4891 Unspecified atrial fibrillation: Secondary | ICD-10-CM | POA: Diagnosis not present

## 2021-04-11 DIAGNOSIS — K222 Esophageal obstruction: Secondary | ICD-10-CM | POA: Diagnosis not present

## 2021-04-11 DIAGNOSIS — N189 Chronic kidney disease, unspecified: Secondary | ICD-10-CM | POA: Diagnosis not present

## 2021-04-11 DIAGNOSIS — R1310 Dysphagia, unspecified: Secondary | ICD-10-CM | POA: Diagnosis not present

## 2021-04-11 DIAGNOSIS — I129 Hypertensive chronic kidney disease with stage 1 through stage 4 chronic kidney disease, or unspecified chronic kidney disease: Secondary | ICD-10-CM | POA: Diagnosis not present

## 2021-04-11 DIAGNOSIS — F32A Depression, unspecified: Secondary | ICD-10-CM | POA: Diagnosis not present

## 2021-04-12 DIAGNOSIS — I4891 Unspecified atrial fibrillation: Secondary | ICD-10-CM | POA: Diagnosis not present

## 2021-04-12 DIAGNOSIS — K222 Esophageal obstruction: Secondary | ICD-10-CM | POA: Diagnosis not present

## 2021-04-12 DIAGNOSIS — F32A Depression, unspecified: Secondary | ICD-10-CM | POA: Diagnosis not present

## 2021-04-12 DIAGNOSIS — N189 Chronic kidney disease, unspecified: Secondary | ICD-10-CM | POA: Diagnosis not present

## 2021-04-12 DIAGNOSIS — I129 Hypertensive chronic kidney disease with stage 1 through stage 4 chronic kidney disease, or unspecified chronic kidney disease: Secondary | ICD-10-CM | POA: Diagnosis not present

## 2021-04-12 DIAGNOSIS — R1310 Dysphagia, unspecified: Secondary | ICD-10-CM | POA: Diagnosis not present

## 2021-04-17 DIAGNOSIS — I129 Hypertensive chronic kidney disease with stage 1 through stage 4 chronic kidney disease, or unspecified chronic kidney disease: Secondary | ICD-10-CM | POA: Diagnosis not present

## 2021-04-17 DIAGNOSIS — N1831 Chronic kidney disease, stage 3a: Secondary | ICD-10-CM | POA: Diagnosis not present

## 2021-04-17 DIAGNOSIS — M170 Bilateral primary osteoarthritis of knee: Secondary | ICD-10-CM | POA: Diagnosis not present

## 2021-04-17 DIAGNOSIS — D6869 Other thrombophilia: Secondary | ICD-10-CM | POA: Diagnosis not present

## 2021-04-17 DIAGNOSIS — I480 Paroxysmal atrial fibrillation: Secondary | ICD-10-CM | POA: Diagnosis not present

## 2021-04-17 DIAGNOSIS — G301 Alzheimer's disease with late onset: Secondary | ICD-10-CM | POA: Diagnosis not present

## 2021-04-18 DIAGNOSIS — F32A Depression, unspecified: Secondary | ICD-10-CM | POA: Diagnosis not present

## 2021-04-18 DIAGNOSIS — I4891 Unspecified atrial fibrillation: Secondary | ICD-10-CM | POA: Diagnosis not present

## 2021-04-18 DIAGNOSIS — K222 Esophageal obstruction: Secondary | ICD-10-CM | POA: Diagnosis not present

## 2021-04-18 DIAGNOSIS — N189 Chronic kidney disease, unspecified: Secondary | ICD-10-CM | POA: Diagnosis not present

## 2021-04-18 DIAGNOSIS — I129 Hypertensive chronic kidney disease with stage 1 through stage 4 chronic kidney disease, or unspecified chronic kidney disease: Secondary | ICD-10-CM | POA: Diagnosis not present

## 2021-04-18 DIAGNOSIS — R1310 Dysphagia, unspecified: Secondary | ICD-10-CM | POA: Diagnosis not present

## 2021-04-19 DIAGNOSIS — F32A Depression, unspecified: Secondary | ICD-10-CM | POA: Diagnosis not present

## 2021-04-19 DIAGNOSIS — I129 Hypertensive chronic kidney disease with stage 1 through stage 4 chronic kidney disease, or unspecified chronic kidney disease: Secondary | ICD-10-CM | POA: Diagnosis not present

## 2021-04-19 DIAGNOSIS — K222 Esophageal obstruction: Secondary | ICD-10-CM | POA: Diagnosis not present

## 2021-04-19 DIAGNOSIS — R1310 Dysphagia, unspecified: Secondary | ICD-10-CM | POA: Diagnosis not present

## 2021-04-19 DIAGNOSIS — I4891 Unspecified atrial fibrillation: Secondary | ICD-10-CM | POA: Diagnosis not present

## 2021-04-19 DIAGNOSIS — N189 Chronic kidney disease, unspecified: Secondary | ICD-10-CM | POA: Diagnosis not present

## 2021-04-23 DIAGNOSIS — R1310 Dysphagia, unspecified: Secondary | ICD-10-CM | POA: Diagnosis not present

## 2021-04-23 DIAGNOSIS — I4891 Unspecified atrial fibrillation: Secondary | ICD-10-CM | POA: Diagnosis not present

## 2021-04-23 DIAGNOSIS — K222 Esophageal obstruction: Secondary | ICD-10-CM | POA: Diagnosis not present

## 2021-04-23 DIAGNOSIS — F32A Depression, unspecified: Secondary | ICD-10-CM | POA: Diagnosis not present

## 2021-04-23 DIAGNOSIS — N189 Chronic kidney disease, unspecified: Secondary | ICD-10-CM | POA: Diagnosis not present

## 2021-04-23 DIAGNOSIS — I129 Hypertensive chronic kidney disease with stage 1 through stage 4 chronic kidney disease, or unspecified chronic kidney disease: Secondary | ICD-10-CM | POA: Diagnosis not present

## 2021-04-27 DIAGNOSIS — R441 Visual hallucinations: Secondary | ICD-10-CM | POA: Diagnosis not present

## 2021-04-27 DIAGNOSIS — I129 Hypertensive chronic kidney disease with stage 1 through stage 4 chronic kidney disease, or unspecified chronic kidney disease: Secondary | ICD-10-CM | POA: Diagnosis not present

## 2021-04-27 DIAGNOSIS — Z6820 Body mass index (BMI) 20.0-20.9, adult: Secondary | ICD-10-CM | POA: Diagnosis not present

## 2021-04-27 DIAGNOSIS — L89152 Pressure ulcer of sacral region, stage 2: Secondary | ICD-10-CM | POA: Diagnosis not present

## 2021-04-27 DIAGNOSIS — R159 Full incontinence of feces: Secondary | ICD-10-CM | POA: Diagnosis not present

## 2021-04-27 DIAGNOSIS — I4891 Unspecified atrial fibrillation: Secondary | ICD-10-CM | POA: Diagnosis not present

## 2021-04-27 DIAGNOSIS — F32A Depression, unspecified: Secondary | ICD-10-CM | POA: Diagnosis not present

## 2021-04-27 DIAGNOSIS — F0390 Unspecified dementia without behavioral disturbance: Secondary | ICD-10-CM | POA: Diagnosis not present

## 2021-04-27 DIAGNOSIS — R32 Unspecified urinary incontinence: Secondary | ICD-10-CM | POA: Diagnosis not present

## 2021-04-27 DIAGNOSIS — R1310 Dysphagia, unspecified: Secondary | ICD-10-CM | POA: Diagnosis not present

## 2021-04-27 DIAGNOSIS — M3500 Sicca syndrome, unspecified: Secondary | ICD-10-CM | POA: Diagnosis not present

## 2021-04-27 DIAGNOSIS — K219 Gastro-esophageal reflux disease without esophagitis: Secondary | ICD-10-CM | POA: Diagnosis not present

## 2021-04-27 DIAGNOSIS — K222 Esophageal obstruction: Secondary | ICD-10-CM | POA: Diagnosis not present

## 2021-04-27 DIAGNOSIS — G2581 Restless legs syndrome: Secondary | ICD-10-CM | POA: Diagnosis not present

## 2021-04-27 DIAGNOSIS — N189 Chronic kidney disease, unspecified: Secondary | ICD-10-CM | POA: Diagnosis not present

## 2021-04-30 DIAGNOSIS — N189 Chronic kidney disease, unspecified: Secondary | ICD-10-CM | POA: Diagnosis not present

## 2021-04-30 DIAGNOSIS — F32A Depression, unspecified: Secondary | ICD-10-CM | POA: Diagnosis not present

## 2021-04-30 DIAGNOSIS — I129 Hypertensive chronic kidney disease with stage 1 through stage 4 chronic kidney disease, or unspecified chronic kidney disease: Secondary | ICD-10-CM | POA: Diagnosis not present

## 2021-04-30 DIAGNOSIS — I4891 Unspecified atrial fibrillation: Secondary | ICD-10-CM | POA: Diagnosis not present

## 2021-04-30 DIAGNOSIS — K222 Esophageal obstruction: Secondary | ICD-10-CM | POA: Diagnosis not present

## 2021-04-30 DIAGNOSIS — R1310 Dysphagia, unspecified: Secondary | ICD-10-CM | POA: Diagnosis not present

## 2021-05-02 DIAGNOSIS — K222 Esophageal obstruction: Secondary | ICD-10-CM | POA: Diagnosis not present

## 2021-05-02 DIAGNOSIS — F32A Depression, unspecified: Secondary | ICD-10-CM | POA: Diagnosis not present

## 2021-05-02 DIAGNOSIS — N189 Chronic kidney disease, unspecified: Secondary | ICD-10-CM | POA: Diagnosis not present

## 2021-05-02 DIAGNOSIS — R1310 Dysphagia, unspecified: Secondary | ICD-10-CM | POA: Diagnosis not present

## 2021-05-02 DIAGNOSIS — I129 Hypertensive chronic kidney disease with stage 1 through stage 4 chronic kidney disease, or unspecified chronic kidney disease: Secondary | ICD-10-CM | POA: Diagnosis not present

## 2021-05-02 DIAGNOSIS — I4891 Unspecified atrial fibrillation: Secondary | ICD-10-CM | POA: Diagnosis not present

## 2021-05-03 DIAGNOSIS — N189 Chronic kidney disease, unspecified: Secondary | ICD-10-CM | POA: Diagnosis not present

## 2021-05-03 DIAGNOSIS — R1310 Dysphagia, unspecified: Secondary | ICD-10-CM | POA: Diagnosis not present

## 2021-05-03 DIAGNOSIS — K222 Esophageal obstruction: Secondary | ICD-10-CM | POA: Diagnosis not present

## 2021-05-03 DIAGNOSIS — I4891 Unspecified atrial fibrillation: Secondary | ICD-10-CM | POA: Diagnosis not present

## 2021-05-03 DIAGNOSIS — F32A Depression, unspecified: Secondary | ICD-10-CM | POA: Diagnosis not present

## 2021-05-03 DIAGNOSIS — I129 Hypertensive chronic kidney disease with stage 1 through stage 4 chronic kidney disease, or unspecified chronic kidney disease: Secondary | ICD-10-CM | POA: Diagnosis not present

## 2021-05-07 DIAGNOSIS — I129 Hypertensive chronic kidney disease with stage 1 through stage 4 chronic kidney disease, or unspecified chronic kidney disease: Secondary | ICD-10-CM | POA: Diagnosis not present

## 2021-05-07 DIAGNOSIS — K222 Esophageal obstruction: Secondary | ICD-10-CM | POA: Diagnosis not present

## 2021-05-07 DIAGNOSIS — F32A Depression, unspecified: Secondary | ICD-10-CM | POA: Diagnosis not present

## 2021-05-07 DIAGNOSIS — R1310 Dysphagia, unspecified: Secondary | ICD-10-CM | POA: Diagnosis not present

## 2021-05-07 DIAGNOSIS — I4891 Unspecified atrial fibrillation: Secondary | ICD-10-CM | POA: Diagnosis not present

## 2021-05-07 DIAGNOSIS — N189 Chronic kidney disease, unspecified: Secondary | ICD-10-CM | POA: Diagnosis not present

## 2021-05-09 DIAGNOSIS — I4891 Unspecified atrial fibrillation: Secondary | ICD-10-CM | POA: Diagnosis not present

## 2021-05-09 DIAGNOSIS — K222 Esophageal obstruction: Secondary | ICD-10-CM | POA: Diagnosis not present

## 2021-05-09 DIAGNOSIS — F32A Depression, unspecified: Secondary | ICD-10-CM | POA: Diagnosis not present

## 2021-05-09 DIAGNOSIS — I129 Hypertensive chronic kidney disease with stage 1 through stage 4 chronic kidney disease, or unspecified chronic kidney disease: Secondary | ICD-10-CM | POA: Diagnosis not present

## 2021-05-09 DIAGNOSIS — R1310 Dysphagia, unspecified: Secondary | ICD-10-CM | POA: Diagnosis not present

## 2021-05-09 DIAGNOSIS — N189 Chronic kidney disease, unspecified: Secondary | ICD-10-CM | POA: Diagnosis not present

## 2021-05-10 DIAGNOSIS — I129 Hypertensive chronic kidney disease with stage 1 through stage 4 chronic kidney disease, or unspecified chronic kidney disease: Secondary | ICD-10-CM | POA: Diagnosis not present

## 2021-05-10 DIAGNOSIS — I4891 Unspecified atrial fibrillation: Secondary | ICD-10-CM | POA: Diagnosis not present

## 2021-05-10 DIAGNOSIS — R1310 Dysphagia, unspecified: Secondary | ICD-10-CM | POA: Diagnosis not present

## 2021-05-10 DIAGNOSIS — K222 Esophageal obstruction: Secondary | ICD-10-CM | POA: Diagnosis not present

## 2021-05-10 DIAGNOSIS — F32A Depression, unspecified: Secondary | ICD-10-CM | POA: Diagnosis not present

## 2021-05-10 DIAGNOSIS — N189 Chronic kidney disease, unspecified: Secondary | ICD-10-CM | POA: Diagnosis not present

## 2021-05-14 DIAGNOSIS — N189 Chronic kidney disease, unspecified: Secondary | ICD-10-CM | POA: Diagnosis not present

## 2021-05-14 DIAGNOSIS — I129 Hypertensive chronic kidney disease with stage 1 through stage 4 chronic kidney disease, or unspecified chronic kidney disease: Secondary | ICD-10-CM | POA: Diagnosis not present

## 2021-05-14 DIAGNOSIS — K222 Esophageal obstruction: Secondary | ICD-10-CM | POA: Diagnosis not present

## 2021-05-14 DIAGNOSIS — R1310 Dysphagia, unspecified: Secondary | ICD-10-CM | POA: Diagnosis not present

## 2021-05-14 DIAGNOSIS — F32A Depression, unspecified: Secondary | ICD-10-CM | POA: Diagnosis not present

## 2021-05-14 DIAGNOSIS — I4891 Unspecified atrial fibrillation: Secondary | ICD-10-CM | POA: Diagnosis not present

## 2021-05-17 DIAGNOSIS — I129 Hypertensive chronic kidney disease with stage 1 through stage 4 chronic kidney disease, or unspecified chronic kidney disease: Secondary | ICD-10-CM | POA: Diagnosis not present

## 2021-05-17 DIAGNOSIS — R1310 Dysphagia, unspecified: Secondary | ICD-10-CM | POA: Diagnosis not present

## 2021-05-17 DIAGNOSIS — N189 Chronic kidney disease, unspecified: Secondary | ICD-10-CM | POA: Diagnosis not present

## 2021-05-17 DIAGNOSIS — I4891 Unspecified atrial fibrillation: Secondary | ICD-10-CM | POA: Diagnosis not present

## 2021-05-17 DIAGNOSIS — K222 Esophageal obstruction: Secondary | ICD-10-CM | POA: Diagnosis not present

## 2021-05-17 DIAGNOSIS — F32A Depression, unspecified: Secondary | ICD-10-CM | POA: Diagnosis not present

## 2021-05-21 DIAGNOSIS — K222 Esophageal obstruction: Secondary | ICD-10-CM | POA: Diagnosis not present

## 2021-05-21 DIAGNOSIS — I4891 Unspecified atrial fibrillation: Secondary | ICD-10-CM | POA: Diagnosis not present

## 2021-05-21 DIAGNOSIS — I129 Hypertensive chronic kidney disease with stage 1 through stage 4 chronic kidney disease, or unspecified chronic kidney disease: Secondary | ICD-10-CM | POA: Diagnosis not present

## 2021-05-21 DIAGNOSIS — R1310 Dysphagia, unspecified: Secondary | ICD-10-CM | POA: Diagnosis not present

## 2021-05-21 DIAGNOSIS — N189 Chronic kidney disease, unspecified: Secondary | ICD-10-CM | POA: Diagnosis not present

## 2021-05-21 DIAGNOSIS — F32A Depression, unspecified: Secondary | ICD-10-CM | POA: Diagnosis not present

## 2021-05-25 DIAGNOSIS — K222 Esophageal obstruction: Secondary | ICD-10-CM | POA: Diagnosis not present

## 2021-05-25 DIAGNOSIS — F32A Depression, unspecified: Secondary | ICD-10-CM | POA: Diagnosis not present

## 2021-05-25 DIAGNOSIS — R1310 Dysphagia, unspecified: Secondary | ICD-10-CM | POA: Diagnosis not present

## 2021-05-25 DIAGNOSIS — I4891 Unspecified atrial fibrillation: Secondary | ICD-10-CM | POA: Diagnosis not present

## 2021-05-25 DIAGNOSIS — I129 Hypertensive chronic kidney disease with stage 1 through stage 4 chronic kidney disease, or unspecified chronic kidney disease: Secondary | ICD-10-CM | POA: Diagnosis not present

## 2021-05-25 DIAGNOSIS — N189 Chronic kidney disease, unspecified: Secondary | ICD-10-CM | POA: Diagnosis not present

## 2021-05-28 DIAGNOSIS — G2581 Restless legs syndrome: Secondary | ICD-10-CM | POA: Diagnosis not present

## 2021-05-28 DIAGNOSIS — F0390 Unspecified dementia without behavioral disturbance: Secondary | ICD-10-CM | POA: Diagnosis not present

## 2021-05-28 DIAGNOSIS — I4891 Unspecified atrial fibrillation: Secondary | ICD-10-CM | POA: Diagnosis not present

## 2021-05-28 DIAGNOSIS — R441 Visual hallucinations: Secondary | ICD-10-CM | POA: Diagnosis not present

## 2021-05-28 DIAGNOSIS — N189 Chronic kidney disease, unspecified: Secondary | ICD-10-CM | POA: Diagnosis not present

## 2021-05-28 DIAGNOSIS — R32 Unspecified urinary incontinence: Secondary | ICD-10-CM | POA: Diagnosis not present

## 2021-05-28 DIAGNOSIS — M3500 Sicca syndrome, unspecified: Secondary | ICD-10-CM | POA: Diagnosis not present

## 2021-05-28 DIAGNOSIS — F32A Depression, unspecified: Secondary | ICD-10-CM | POA: Diagnosis not present

## 2021-05-28 DIAGNOSIS — R1310 Dysphagia, unspecified: Secondary | ICD-10-CM | POA: Diagnosis not present

## 2021-05-28 DIAGNOSIS — K219 Gastro-esophageal reflux disease without esophagitis: Secondary | ICD-10-CM | POA: Diagnosis not present

## 2021-05-28 DIAGNOSIS — Z6820 Body mass index (BMI) 20.0-20.9, adult: Secondary | ICD-10-CM | POA: Diagnosis not present

## 2021-05-28 DIAGNOSIS — K222 Esophageal obstruction: Secondary | ICD-10-CM | POA: Diagnosis not present

## 2021-05-28 DIAGNOSIS — L89152 Pressure ulcer of sacral region, stage 2: Secondary | ICD-10-CM | POA: Diagnosis not present

## 2021-05-28 DIAGNOSIS — I129 Hypertensive chronic kidney disease with stage 1 through stage 4 chronic kidney disease, or unspecified chronic kidney disease: Secondary | ICD-10-CM | POA: Diagnosis not present

## 2021-05-28 DIAGNOSIS — R159 Full incontinence of feces: Secondary | ICD-10-CM | POA: Diagnosis not present

## 2021-05-31 DIAGNOSIS — K222 Esophageal obstruction: Secondary | ICD-10-CM | POA: Diagnosis not present

## 2021-05-31 DIAGNOSIS — R1310 Dysphagia, unspecified: Secondary | ICD-10-CM | POA: Diagnosis not present

## 2021-05-31 DIAGNOSIS — I4891 Unspecified atrial fibrillation: Secondary | ICD-10-CM | POA: Diagnosis not present

## 2021-05-31 DIAGNOSIS — N189 Chronic kidney disease, unspecified: Secondary | ICD-10-CM | POA: Diagnosis not present

## 2021-05-31 DIAGNOSIS — F32A Depression, unspecified: Secondary | ICD-10-CM | POA: Diagnosis not present

## 2021-05-31 DIAGNOSIS — I129 Hypertensive chronic kidney disease with stage 1 through stage 4 chronic kidney disease, or unspecified chronic kidney disease: Secondary | ICD-10-CM | POA: Diagnosis not present

## 2021-06-01 DIAGNOSIS — K222 Esophageal obstruction: Secondary | ICD-10-CM | POA: Diagnosis not present

## 2021-06-01 DIAGNOSIS — R1310 Dysphagia, unspecified: Secondary | ICD-10-CM | POA: Diagnosis not present

## 2021-06-01 DIAGNOSIS — I4891 Unspecified atrial fibrillation: Secondary | ICD-10-CM | POA: Diagnosis not present

## 2021-06-01 DIAGNOSIS — N189 Chronic kidney disease, unspecified: Secondary | ICD-10-CM | POA: Diagnosis not present

## 2021-06-01 DIAGNOSIS — F32A Depression, unspecified: Secondary | ICD-10-CM | POA: Diagnosis not present

## 2021-06-01 DIAGNOSIS — I129 Hypertensive chronic kidney disease with stage 1 through stage 4 chronic kidney disease, or unspecified chronic kidney disease: Secondary | ICD-10-CM | POA: Diagnosis not present

## 2021-06-04 DIAGNOSIS — I129 Hypertensive chronic kidney disease with stage 1 through stage 4 chronic kidney disease, or unspecified chronic kidney disease: Secondary | ICD-10-CM | POA: Diagnosis not present

## 2021-06-04 DIAGNOSIS — K222 Esophageal obstruction: Secondary | ICD-10-CM | POA: Diagnosis not present

## 2021-06-04 DIAGNOSIS — F32A Depression, unspecified: Secondary | ICD-10-CM | POA: Diagnosis not present

## 2021-06-04 DIAGNOSIS — N189 Chronic kidney disease, unspecified: Secondary | ICD-10-CM | POA: Diagnosis not present

## 2021-06-04 DIAGNOSIS — I4891 Unspecified atrial fibrillation: Secondary | ICD-10-CM | POA: Diagnosis not present

## 2021-06-04 DIAGNOSIS — R1310 Dysphagia, unspecified: Secondary | ICD-10-CM | POA: Diagnosis not present

## 2021-06-06 DIAGNOSIS — I4891 Unspecified atrial fibrillation: Secondary | ICD-10-CM | POA: Diagnosis not present

## 2021-06-06 DIAGNOSIS — F32A Depression, unspecified: Secondary | ICD-10-CM | POA: Diagnosis not present

## 2021-06-06 DIAGNOSIS — K222 Esophageal obstruction: Secondary | ICD-10-CM | POA: Diagnosis not present

## 2021-06-06 DIAGNOSIS — N189 Chronic kidney disease, unspecified: Secondary | ICD-10-CM | POA: Diagnosis not present

## 2021-06-06 DIAGNOSIS — R1310 Dysphagia, unspecified: Secondary | ICD-10-CM | POA: Diagnosis not present

## 2021-06-06 DIAGNOSIS — I129 Hypertensive chronic kidney disease with stage 1 through stage 4 chronic kidney disease, or unspecified chronic kidney disease: Secondary | ICD-10-CM | POA: Diagnosis not present

## 2021-06-07 DIAGNOSIS — I129 Hypertensive chronic kidney disease with stage 1 through stage 4 chronic kidney disease, or unspecified chronic kidney disease: Secondary | ICD-10-CM | POA: Diagnosis not present

## 2021-06-07 DIAGNOSIS — K222 Esophageal obstruction: Secondary | ICD-10-CM | POA: Diagnosis not present

## 2021-06-07 DIAGNOSIS — F32A Depression, unspecified: Secondary | ICD-10-CM | POA: Diagnosis not present

## 2021-06-07 DIAGNOSIS — I4891 Unspecified atrial fibrillation: Secondary | ICD-10-CM | POA: Diagnosis not present

## 2021-06-07 DIAGNOSIS — N189 Chronic kidney disease, unspecified: Secondary | ICD-10-CM | POA: Diagnosis not present

## 2021-06-07 DIAGNOSIS — R1310 Dysphagia, unspecified: Secondary | ICD-10-CM | POA: Diagnosis not present

## 2021-06-08 DIAGNOSIS — I129 Hypertensive chronic kidney disease with stage 1 through stage 4 chronic kidney disease, or unspecified chronic kidney disease: Secondary | ICD-10-CM | POA: Diagnosis not present

## 2021-06-08 DIAGNOSIS — N189 Chronic kidney disease, unspecified: Secondary | ICD-10-CM | POA: Diagnosis not present

## 2021-06-08 DIAGNOSIS — R1310 Dysphagia, unspecified: Secondary | ICD-10-CM | POA: Diagnosis not present

## 2021-06-08 DIAGNOSIS — I4891 Unspecified atrial fibrillation: Secondary | ICD-10-CM | POA: Diagnosis not present

## 2021-06-08 DIAGNOSIS — F32A Depression, unspecified: Secondary | ICD-10-CM | POA: Diagnosis not present

## 2021-06-08 DIAGNOSIS — K222 Esophageal obstruction: Secondary | ICD-10-CM | POA: Diagnosis not present

## 2021-06-11 DIAGNOSIS — F32A Depression, unspecified: Secondary | ICD-10-CM | POA: Diagnosis not present

## 2021-06-11 DIAGNOSIS — I4891 Unspecified atrial fibrillation: Secondary | ICD-10-CM | POA: Diagnosis not present

## 2021-06-11 DIAGNOSIS — K222 Esophageal obstruction: Secondary | ICD-10-CM | POA: Diagnosis not present

## 2021-06-11 DIAGNOSIS — N189 Chronic kidney disease, unspecified: Secondary | ICD-10-CM | POA: Diagnosis not present

## 2021-06-11 DIAGNOSIS — I129 Hypertensive chronic kidney disease with stage 1 through stage 4 chronic kidney disease, or unspecified chronic kidney disease: Secondary | ICD-10-CM | POA: Diagnosis not present

## 2021-06-11 DIAGNOSIS — R1310 Dysphagia, unspecified: Secondary | ICD-10-CM | POA: Diagnosis not present

## 2021-06-13 DIAGNOSIS — R1310 Dysphagia, unspecified: Secondary | ICD-10-CM | POA: Diagnosis not present

## 2021-06-13 DIAGNOSIS — K222 Esophageal obstruction: Secondary | ICD-10-CM | POA: Diagnosis not present

## 2021-06-13 DIAGNOSIS — I129 Hypertensive chronic kidney disease with stage 1 through stage 4 chronic kidney disease, or unspecified chronic kidney disease: Secondary | ICD-10-CM | POA: Diagnosis not present

## 2021-06-13 DIAGNOSIS — N189 Chronic kidney disease, unspecified: Secondary | ICD-10-CM | POA: Diagnosis not present

## 2021-06-13 DIAGNOSIS — F32A Depression, unspecified: Secondary | ICD-10-CM | POA: Diagnosis not present

## 2021-06-13 DIAGNOSIS — I4891 Unspecified atrial fibrillation: Secondary | ICD-10-CM | POA: Diagnosis not present

## 2021-06-14 DIAGNOSIS — I129 Hypertensive chronic kidney disease with stage 1 through stage 4 chronic kidney disease, or unspecified chronic kidney disease: Secondary | ICD-10-CM | POA: Diagnosis not present

## 2021-06-14 DIAGNOSIS — K222 Esophageal obstruction: Secondary | ICD-10-CM | POA: Diagnosis not present

## 2021-06-14 DIAGNOSIS — F32A Depression, unspecified: Secondary | ICD-10-CM | POA: Diagnosis not present

## 2021-06-14 DIAGNOSIS — I4891 Unspecified atrial fibrillation: Secondary | ICD-10-CM | POA: Diagnosis not present

## 2021-06-14 DIAGNOSIS — N189 Chronic kidney disease, unspecified: Secondary | ICD-10-CM | POA: Diagnosis not present

## 2021-06-14 DIAGNOSIS — R1310 Dysphagia, unspecified: Secondary | ICD-10-CM | POA: Diagnosis not present

## 2021-06-19 DIAGNOSIS — N189 Chronic kidney disease, unspecified: Secondary | ICD-10-CM | POA: Diagnosis not present

## 2021-06-19 DIAGNOSIS — K222 Esophageal obstruction: Secondary | ICD-10-CM | POA: Diagnosis not present

## 2021-06-19 DIAGNOSIS — F32A Depression, unspecified: Secondary | ICD-10-CM | POA: Diagnosis not present

## 2021-06-19 DIAGNOSIS — R1310 Dysphagia, unspecified: Secondary | ICD-10-CM | POA: Diagnosis not present

## 2021-06-19 DIAGNOSIS — I4891 Unspecified atrial fibrillation: Secondary | ICD-10-CM | POA: Diagnosis not present

## 2021-06-19 DIAGNOSIS — I129 Hypertensive chronic kidney disease with stage 1 through stage 4 chronic kidney disease, or unspecified chronic kidney disease: Secondary | ICD-10-CM | POA: Diagnosis not present

## 2021-06-21 DIAGNOSIS — F32A Depression, unspecified: Secondary | ICD-10-CM | POA: Diagnosis not present

## 2021-06-21 DIAGNOSIS — N189 Chronic kidney disease, unspecified: Secondary | ICD-10-CM | POA: Diagnosis not present

## 2021-06-21 DIAGNOSIS — I129 Hypertensive chronic kidney disease with stage 1 through stage 4 chronic kidney disease, or unspecified chronic kidney disease: Secondary | ICD-10-CM | POA: Diagnosis not present

## 2021-06-21 DIAGNOSIS — R1310 Dysphagia, unspecified: Secondary | ICD-10-CM | POA: Diagnosis not present

## 2021-06-21 DIAGNOSIS — K222 Esophageal obstruction: Secondary | ICD-10-CM | POA: Diagnosis not present

## 2021-06-21 DIAGNOSIS — I4891 Unspecified atrial fibrillation: Secondary | ICD-10-CM | POA: Diagnosis not present

## 2021-06-25 DIAGNOSIS — N189 Chronic kidney disease, unspecified: Secondary | ICD-10-CM | POA: Diagnosis not present

## 2021-06-25 DIAGNOSIS — R1310 Dysphagia, unspecified: Secondary | ICD-10-CM | POA: Diagnosis not present

## 2021-06-25 DIAGNOSIS — I4891 Unspecified atrial fibrillation: Secondary | ICD-10-CM | POA: Diagnosis not present

## 2021-06-25 DIAGNOSIS — F32A Depression, unspecified: Secondary | ICD-10-CM | POA: Diagnosis not present

## 2021-06-25 DIAGNOSIS — I129 Hypertensive chronic kidney disease with stage 1 through stage 4 chronic kidney disease, or unspecified chronic kidney disease: Secondary | ICD-10-CM | POA: Diagnosis not present

## 2021-06-25 DIAGNOSIS — K222 Esophageal obstruction: Secondary | ICD-10-CM | POA: Diagnosis not present

## 2021-06-27 DIAGNOSIS — R1310 Dysphagia, unspecified: Secondary | ICD-10-CM | POA: Diagnosis not present

## 2021-06-27 DIAGNOSIS — F32A Depression, unspecified: Secondary | ICD-10-CM | POA: Diagnosis not present

## 2021-06-27 DIAGNOSIS — N189 Chronic kidney disease, unspecified: Secondary | ICD-10-CM | POA: Diagnosis not present

## 2021-06-27 DIAGNOSIS — I129 Hypertensive chronic kidney disease with stage 1 through stage 4 chronic kidney disease, or unspecified chronic kidney disease: Secondary | ICD-10-CM | POA: Diagnosis not present

## 2021-06-27 DIAGNOSIS — K222 Esophageal obstruction: Secondary | ICD-10-CM | POA: Diagnosis not present

## 2021-06-27 DIAGNOSIS — I4891 Unspecified atrial fibrillation: Secondary | ICD-10-CM | POA: Diagnosis not present

## 2021-06-28 DIAGNOSIS — I4891 Unspecified atrial fibrillation: Secondary | ICD-10-CM | POA: Diagnosis not present

## 2021-06-28 DIAGNOSIS — L89152 Pressure ulcer of sacral region, stage 2: Secondary | ICD-10-CM | POA: Diagnosis not present

## 2021-06-28 DIAGNOSIS — G2581 Restless legs syndrome: Secondary | ICD-10-CM | POA: Diagnosis not present

## 2021-06-28 DIAGNOSIS — R1310 Dysphagia, unspecified: Secondary | ICD-10-CM | POA: Diagnosis not present

## 2021-06-28 DIAGNOSIS — F32A Depression, unspecified: Secondary | ICD-10-CM | POA: Diagnosis not present

## 2021-06-28 DIAGNOSIS — M3500 Sicca syndrome, unspecified: Secondary | ICD-10-CM | POA: Diagnosis not present

## 2021-06-28 DIAGNOSIS — Z6820 Body mass index (BMI) 20.0-20.9, adult: Secondary | ICD-10-CM | POA: Diagnosis not present

## 2021-06-28 DIAGNOSIS — R32 Unspecified urinary incontinence: Secondary | ICD-10-CM | POA: Diagnosis not present

## 2021-06-28 DIAGNOSIS — R441 Visual hallucinations: Secondary | ICD-10-CM | POA: Diagnosis not present

## 2021-06-28 DIAGNOSIS — K222 Esophageal obstruction: Secondary | ICD-10-CM | POA: Diagnosis not present

## 2021-06-28 DIAGNOSIS — F0390 Unspecified dementia without behavioral disturbance: Secondary | ICD-10-CM | POA: Diagnosis not present

## 2021-06-28 DIAGNOSIS — K219 Gastro-esophageal reflux disease without esophagitis: Secondary | ICD-10-CM | POA: Diagnosis not present

## 2021-06-28 DIAGNOSIS — R159 Full incontinence of feces: Secondary | ICD-10-CM | POA: Diagnosis not present

## 2021-06-28 DIAGNOSIS — N189 Chronic kidney disease, unspecified: Secondary | ICD-10-CM | POA: Diagnosis not present

## 2021-06-28 DIAGNOSIS — I129 Hypertensive chronic kidney disease with stage 1 through stage 4 chronic kidney disease, or unspecified chronic kidney disease: Secondary | ICD-10-CM | POA: Diagnosis not present

## 2021-07-02 DIAGNOSIS — K222 Esophageal obstruction: Secondary | ICD-10-CM | POA: Diagnosis not present

## 2021-07-02 DIAGNOSIS — R1310 Dysphagia, unspecified: Secondary | ICD-10-CM | POA: Diagnosis not present

## 2021-07-02 DIAGNOSIS — N189 Chronic kidney disease, unspecified: Secondary | ICD-10-CM | POA: Diagnosis not present

## 2021-07-02 DIAGNOSIS — F32A Depression, unspecified: Secondary | ICD-10-CM | POA: Diagnosis not present

## 2021-07-02 DIAGNOSIS — I129 Hypertensive chronic kidney disease with stage 1 through stage 4 chronic kidney disease, or unspecified chronic kidney disease: Secondary | ICD-10-CM | POA: Diagnosis not present

## 2021-07-02 DIAGNOSIS — I4891 Unspecified atrial fibrillation: Secondary | ICD-10-CM | POA: Diagnosis not present

## 2021-07-04 DIAGNOSIS — N189 Chronic kidney disease, unspecified: Secondary | ICD-10-CM | POA: Diagnosis not present

## 2021-07-04 DIAGNOSIS — F32A Depression, unspecified: Secondary | ICD-10-CM | POA: Diagnosis not present

## 2021-07-04 DIAGNOSIS — I4891 Unspecified atrial fibrillation: Secondary | ICD-10-CM | POA: Diagnosis not present

## 2021-07-04 DIAGNOSIS — K222 Esophageal obstruction: Secondary | ICD-10-CM | POA: Diagnosis not present

## 2021-07-04 DIAGNOSIS — I129 Hypertensive chronic kidney disease with stage 1 through stage 4 chronic kidney disease, or unspecified chronic kidney disease: Secondary | ICD-10-CM | POA: Diagnosis not present

## 2021-07-04 DIAGNOSIS — R1310 Dysphagia, unspecified: Secondary | ICD-10-CM | POA: Diagnosis not present

## 2021-07-05 DIAGNOSIS — N189 Chronic kidney disease, unspecified: Secondary | ICD-10-CM | POA: Diagnosis not present

## 2021-07-05 DIAGNOSIS — R1310 Dysphagia, unspecified: Secondary | ICD-10-CM | POA: Diagnosis not present

## 2021-07-05 DIAGNOSIS — K222 Esophageal obstruction: Secondary | ICD-10-CM | POA: Diagnosis not present

## 2021-07-05 DIAGNOSIS — F32A Depression, unspecified: Secondary | ICD-10-CM | POA: Diagnosis not present

## 2021-07-05 DIAGNOSIS — I129 Hypertensive chronic kidney disease with stage 1 through stage 4 chronic kidney disease, or unspecified chronic kidney disease: Secondary | ICD-10-CM | POA: Diagnosis not present

## 2021-07-05 DIAGNOSIS — I4891 Unspecified atrial fibrillation: Secondary | ICD-10-CM | POA: Diagnosis not present

## 2021-07-09 DIAGNOSIS — N189 Chronic kidney disease, unspecified: Secondary | ICD-10-CM | POA: Diagnosis not present

## 2021-07-09 DIAGNOSIS — F32A Depression, unspecified: Secondary | ICD-10-CM | POA: Diagnosis not present

## 2021-07-09 DIAGNOSIS — K222 Esophageal obstruction: Secondary | ICD-10-CM | POA: Diagnosis not present

## 2021-07-09 DIAGNOSIS — I4891 Unspecified atrial fibrillation: Secondary | ICD-10-CM | POA: Diagnosis not present

## 2021-07-09 DIAGNOSIS — I129 Hypertensive chronic kidney disease with stage 1 through stage 4 chronic kidney disease, or unspecified chronic kidney disease: Secondary | ICD-10-CM | POA: Diagnosis not present

## 2021-07-09 DIAGNOSIS — R1310 Dysphagia, unspecified: Secondary | ICD-10-CM | POA: Diagnosis not present

## 2021-07-11 DIAGNOSIS — K222 Esophageal obstruction: Secondary | ICD-10-CM | POA: Diagnosis not present

## 2021-07-11 DIAGNOSIS — R1310 Dysphagia, unspecified: Secondary | ICD-10-CM | POA: Diagnosis not present

## 2021-07-11 DIAGNOSIS — I4891 Unspecified atrial fibrillation: Secondary | ICD-10-CM | POA: Diagnosis not present

## 2021-07-11 DIAGNOSIS — I129 Hypertensive chronic kidney disease with stage 1 through stage 4 chronic kidney disease, or unspecified chronic kidney disease: Secondary | ICD-10-CM | POA: Diagnosis not present

## 2021-07-11 DIAGNOSIS — N189 Chronic kidney disease, unspecified: Secondary | ICD-10-CM | POA: Diagnosis not present

## 2021-07-11 DIAGNOSIS — F32A Depression, unspecified: Secondary | ICD-10-CM | POA: Diagnosis not present

## 2021-07-12 DIAGNOSIS — R1310 Dysphagia, unspecified: Secondary | ICD-10-CM | POA: Diagnosis not present

## 2021-07-12 DIAGNOSIS — I4891 Unspecified atrial fibrillation: Secondary | ICD-10-CM | POA: Diagnosis not present

## 2021-07-12 DIAGNOSIS — K222 Esophageal obstruction: Secondary | ICD-10-CM | POA: Diagnosis not present

## 2021-07-12 DIAGNOSIS — I129 Hypertensive chronic kidney disease with stage 1 through stage 4 chronic kidney disease, or unspecified chronic kidney disease: Secondary | ICD-10-CM | POA: Diagnosis not present

## 2021-07-12 DIAGNOSIS — N189 Chronic kidney disease, unspecified: Secondary | ICD-10-CM | POA: Diagnosis not present

## 2021-07-12 DIAGNOSIS — F32A Depression, unspecified: Secondary | ICD-10-CM | POA: Diagnosis not present

## 2021-07-16 DIAGNOSIS — I129 Hypertensive chronic kidney disease with stage 1 through stage 4 chronic kidney disease, or unspecified chronic kidney disease: Secondary | ICD-10-CM | POA: Diagnosis not present

## 2021-07-16 DIAGNOSIS — F32A Depression, unspecified: Secondary | ICD-10-CM | POA: Diagnosis not present

## 2021-07-16 DIAGNOSIS — I4891 Unspecified atrial fibrillation: Secondary | ICD-10-CM | POA: Diagnosis not present

## 2021-07-16 DIAGNOSIS — R1310 Dysphagia, unspecified: Secondary | ICD-10-CM | POA: Diagnosis not present

## 2021-07-16 DIAGNOSIS — N189 Chronic kidney disease, unspecified: Secondary | ICD-10-CM | POA: Diagnosis not present

## 2021-07-16 DIAGNOSIS — K222 Esophageal obstruction: Secondary | ICD-10-CM | POA: Diagnosis not present

## 2021-07-18 DIAGNOSIS — N189 Chronic kidney disease, unspecified: Secondary | ICD-10-CM | POA: Diagnosis not present

## 2021-07-18 DIAGNOSIS — F32A Depression, unspecified: Secondary | ICD-10-CM | POA: Diagnosis not present

## 2021-07-18 DIAGNOSIS — R1310 Dysphagia, unspecified: Secondary | ICD-10-CM | POA: Diagnosis not present

## 2021-07-18 DIAGNOSIS — I129 Hypertensive chronic kidney disease with stage 1 through stage 4 chronic kidney disease, or unspecified chronic kidney disease: Secondary | ICD-10-CM | POA: Diagnosis not present

## 2021-07-18 DIAGNOSIS — I4891 Unspecified atrial fibrillation: Secondary | ICD-10-CM | POA: Diagnosis not present

## 2021-07-18 DIAGNOSIS — K222 Esophageal obstruction: Secondary | ICD-10-CM | POA: Diagnosis not present

## 2021-07-19 DIAGNOSIS — N189 Chronic kidney disease, unspecified: Secondary | ICD-10-CM | POA: Diagnosis not present

## 2021-07-19 DIAGNOSIS — I4891 Unspecified atrial fibrillation: Secondary | ICD-10-CM | POA: Diagnosis not present

## 2021-07-19 DIAGNOSIS — F32A Depression, unspecified: Secondary | ICD-10-CM | POA: Diagnosis not present

## 2021-07-19 DIAGNOSIS — R1310 Dysphagia, unspecified: Secondary | ICD-10-CM | POA: Diagnosis not present

## 2021-07-19 DIAGNOSIS — I129 Hypertensive chronic kidney disease with stage 1 through stage 4 chronic kidney disease, or unspecified chronic kidney disease: Secondary | ICD-10-CM | POA: Diagnosis not present

## 2021-07-19 DIAGNOSIS — K222 Esophageal obstruction: Secondary | ICD-10-CM | POA: Diagnosis not present

## 2021-07-23 DIAGNOSIS — F32A Depression, unspecified: Secondary | ICD-10-CM | POA: Diagnosis not present

## 2021-07-23 DIAGNOSIS — I129 Hypertensive chronic kidney disease with stage 1 through stage 4 chronic kidney disease, or unspecified chronic kidney disease: Secondary | ICD-10-CM | POA: Diagnosis not present

## 2021-07-23 DIAGNOSIS — I4891 Unspecified atrial fibrillation: Secondary | ICD-10-CM | POA: Diagnosis not present

## 2021-07-23 DIAGNOSIS — N189 Chronic kidney disease, unspecified: Secondary | ICD-10-CM | POA: Diagnosis not present

## 2021-07-23 DIAGNOSIS — K222 Esophageal obstruction: Secondary | ICD-10-CM | POA: Diagnosis not present

## 2021-07-23 DIAGNOSIS — R1310 Dysphagia, unspecified: Secondary | ICD-10-CM | POA: Diagnosis not present

## 2021-07-25 DIAGNOSIS — I129 Hypertensive chronic kidney disease with stage 1 through stage 4 chronic kidney disease, or unspecified chronic kidney disease: Secondary | ICD-10-CM | POA: Diagnosis not present

## 2021-07-25 DIAGNOSIS — F32A Depression, unspecified: Secondary | ICD-10-CM | POA: Diagnosis not present

## 2021-07-25 DIAGNOSIS — R1310 Dysphagia, unspecified: Secondary | ICD-10-CM | POA: Diagnosis not present

## 2021-07-25 DIAGNOSIS — I4891 Unspecified atrial fibrillation: Secondary | ICD-10-CM | POA: Diagnosis not present

## 2021-07-25 DIAGNOSIS — K222 Esophageal obstruction: Secondary | ICD-10-CM | POA: Diagnosis not present

## 2021-07-25 DIAGNOSIS — N189 Chronic kidney disease, unspecified: Secondary | ICD-10-CM | POA: Diagnosis not present

## 2021-07-26 DIAGNOSIS — N189 Chronic kidney disease, unspecified: Secondary | ICD-10-CM | POA: Diagnosis not present

## 2021-07-26 DIAGNOSIS — R1310 Dysphagia, unspecified: Secondary | ICD-10-CM | POA: Diagnosis not present

## 2021-07-26 DIAGNOSIS — K222 Esophageal obstruction: Secondary | ICD-10-CM | POA: Diagnosis not present

## 2021-07-26 DIAGNOSIS — F32A Depression, unspecified: Secondary | ICD-10-CM | POA: Diagnosis not present

## 2021-07-26 DIAGNOSIS — I4891 Unspecified atrial fibrillation: Secondary | ICD-10-CM | POA: Diagnosis not present

## 2021-07-26 DIAGNOSIS — I129 Hypertensive chronic kidney disease with stage 1 through stage 4 chronic kidney disease, or unspecified chronic kidney disease: Secondary | ICD-10-CM | POA: Diagnosis not present

## 2021-07-28 DIAGNOSIS — I129 Hypertensive chronic kidney disease with stage 1 through stage 4 chronic kidney disease, or unspecified chronic kidney disease: Secondary | ICD-10-CM | POA: Diagnosis not present

## 2021-07-28 DIAGNOSIS — K219 Gastro-esophageal reflux disease without esophagitis: Secondary | ICD-10-CM | POA: Diagnosis not present

## 2021-07-28 DIAGNOSIS — I4891 Unspecified atrial fibrillation: Secondary | ICD-10-CM | POA: Diagnosis not present

## 2021-07-28 DIAGNOSIS — R159 Full incontinence of feces: Secondary | ICD-10-CM | POA: Diagnosis not present

## 2021-07-28 DIAGNOSIS — G2581 Restless legs syndrome: Secondary | ICD-10-CM | POA: Diagnosis not present

## 2021-07-28 DIAGNOSIS — R1310 Dysphagia, unspecified: Secondary | ICD-10-CM | POA: Diagnosis not present

## 2021-07-28 DIAGNOSIS — F0390 Unspecified dementia without behavioral disturbance: Secondary | ICD-10-CM | POA: Diagnosis not present

## 2021-07-28 DIAGNOSIS — R441 Visual hallucinations: Secondary | ICD-10-CM | POA: Diagnosis not present

## 2021-07-28 DIAGNOSIS — M3500 Sicca syndrome, unspecified: Secondary | ICD-10-CM | POA: Diagnosis not present

## 2021-07-28 DIAGNOSIS — K222 Esophageal obstruction: Secondary | ICD-10-CM | POA: Diagnosis not present

## 2021-07-28 DIAGNOSIS — N189 Chronic kidney disease, unspecified: Secondary | ICD-10-CM | POA: Diagnosis not present

## 2021-07-28 DIAGNOSIS — R32 Unspecified urinary incontinence: Secondary | ICD-10-CM | POA: Diagnosis not present

## 2021-07-28 DIAGNOSIS — F32A Depression, unspecified: Secondary | ICD-10-CM | POA: Diagnosis not present

## 2021-07-28 DIAGNOSIS — Z6820 Body mass index (BMI) 20.0-20.9, adult: Secondary | ICD-10-CM | POA: Diagnosis not present

## 2021-07-28 DIAGNOSIS — L89152 Pressure ulcer of sacral region, stage 2: Secondary | ICD-10-CM | POA: Diagnosis not present

## 2021-07-30 DIAGNOSIS — R1310 Dysphagia, unspecified: Secondary | ICD-10-CM | POA: Diagnosis not present

## 2021-07-30 DIAGNOSIS — F32A Depression, unspecified: Secondary | ICD-10-CM | POA: Diagnosis not present

## 2021-07-30 DIAGNOSIS — K222 Esophageal obstruction: Secondary | ICD-10-CM | POA: Diagnosis not present

## 2021-07-30 DIAGNOSIS — I129 Hypertensive chronic kidney disease with stage 1 through stage 4 chronic kidney disease, or unspecified chronic kidney disease: Secondary | ICD-10-CM | POA: Diagnosis not present

## 2021-07-30 DIAGNOSIS — I4891 Unspecified atrial fibrillation: Secondary | ICD-10-CM | POA: Diagnosis not present

## 2021-07-30 DIAGNOSIS — N189 Chronic kidney disease, unspecified: Secondary | ICD-10-CM | POA: Diagnosis not present

## 2021-08-01 DIAGNOSIS — N189 Chronic kidney disease, unspecified: Secondary | ICD-10-CM | POA: Diagnosis not present

## 2021-08-01 DIAGNOSIS — K222 Esophageal obstruction: Secondary | ICD-10-CM | POA: Diagnosis not present

## 2021-08-01 DIAGNOSIS — F32A Depression, unspecified: Secondary | ICD-10-CM | POA: Diagnosis not present

## 2021-08-01 DIAGNOSIS — R1310 Dysphagia, unspecified: Secondary | ICD-10-CM | POA: Diagnosis not present

## 2021-08-01 DIAGNOSIS — I4891 Unspecified atrial fibrillation: Secondary | ICD-10-CM | POA: Diagnosis not present

## 2021-08-01 DIAGNOSIS — I129 Hypertensive chronic kidney disease with stage 1 through stage 4 chronic kidney disease, or unspecified chronic kidney disease: Secondary | ICD-10-CM | POA: Diagnosis not present

## 2021-08-02 DIAGNOSIS — F32A Depression, unspecified: Secondary | ICD-10-CM | POA: Diagnosis not present

## 2021-08-02 DIAGNOSIS — K222 Esophageal obstruction: Secondary | ICD-10-CM | POA: Diagnosis not present

## 2021-08-02 DIAGNOSIS — R1310 Dysphagia, unspecified: Secondary | ICD-10-CM | POA: Diagnosis not present

## 2021-08-02 DIAGNOSIS — I129 Hypertensive chronic kidney disease with stage 1 through stage 4 chronic kidney disease, or unspecified chronic kidney disease: Secondary | ICD-10-CM | POA: Diagnosis not present

## 2021-08-02 DIAGNOSIS — I4891 Unspecified atrial fibrillation: Secondary | ICD-10-CM | POA: Diagnosis not present

## 2021-08-02 DIAGNOSIS — N189 Chronic kidney disease, unspecified: Secondary | ICD-10-CM | POA: Diagnosis not present

## 2021-08-06 DIAGNOSIS — I129 Hypertensive chronic kidney disease with stage 1 through stage 4 chronic kidney disease, or unspecified chronic kidney disease: Secondary | ICD-10-CM | POA: Diagnosis not present

## 2021-08-06 DIAGNOSIS — F32A Depression, unspecified: Secondary | ICD-10-CM | POA: Diagnosis not present

## 2021-08-06 DIAGNOSIS — I4891 Unspecified atrial fibrillation: Secondary | ICD-10-CM | POA: Diagnosis not present

## 2021-08-06 DIAGNOSIS — N189 Chronic kidney disease, unspecified: Secondary | ICD-10-CM | POA: Diagnosis not present

## 2021-08-06 DIAGNOSIS — R1310 Dysphagia, unspecified: Secondary | ICD-10-CM | POA: Diagnosis not present

## 2021-08-06 DIAGNOSIS — K222 Esophageal obstruction: Secondary | ICD-10-CM | POA: Diagnosis not present

## 2021-08-08 DIAGNOSIS — R1310 Dysphagia, unspecified: Secondary | ICD-10-CM | POA: Diagnosis not present

## 2021-08-08 DIAGNOSIS — I129 Hypertensive chronic kidney disease with stage 1 through stage 4 chronic kidney disease, or unspecified chronic kidney disease: Secondary | ICD-10-CM | POA: Diagnosis not present

## 2021-08-08 DIAGNOSIS — N189 Chronic kidney disease, unspecified: Secondary | ICD-10-CM | POA: Diagnosis not present

## 2021-08-08 DIAGNOSIS — F32A Depression, unspecified: Secondary | ICD-10-CM | POA: Diagnosis not present

## 2021-08-08 DIAGNOSIS — I4891 Unspecified atrial fibrillation: Secondary | ICD-10-CM | POA: Diagnosis not present

## 2021-08-08 DIAGNOSIS — K222 Esophageal obstruction: Secondary | ICD-10-CM | POA: Diagnosis not present

## 2021-08-09 DIAGNOSIS — I4891 Unspecified atrial fibrillation: Secondary | ICD-10-CM | POA: Diagnosis not present

## 2021-08-09 DIAGNOSIS — N189 Chronic kidney disease, unspecified: Secondary | ICD-10-CM | POA: Diagnosis not present

## 2021-08-09 DIAGNOSIS — B351 Tinea unguium: Secondary | ICD-10-CM | POA: Diagnosis not present

## 2021-08-09 DIAGNOSIS — K222 Esophageal obstruction: Secondary | ICD-10-CM | POA: Diagnosis not present

## 2021-08-09 DIAGNOSIS — R1310 Dysphagia, unspecified: Secondary | ICD-10-CM | POA: Diagnosis not present

## 2021-08-09 DIAGNOSIS — M2041 Other hammer toe(s) (acquired), right foot: Secondary | ICD-10-CM | POA: Diagnosis not present

## 2021-08-09 DIAGNOSIS — I129 Hypertensive chronic kidney disease with stage 1 through stage 4 chronic kidney disease, or unspecified chronic kidney disease: Secondary | ICD-10-CM | POA: Diagnosis not present

## 2021-08-09 DIAGNOSIS — F32A Depression, unspecified: Secondary | ICD-10-CM | POA: Diagnosis not present

## 2021-08-09 DIAGNOSIS — L84 Corns and callosities: Secondary | ICD-10-CM | POA: Diagnosis not present

## 2021-08-13 DIAGNOSIS — I129 Hypertensive chronic kidney disease with stage 1 through stage 4 chronic kidney disease, or unspecified chronic kidney disease: Secondary | ICD-10-CM | POA: Diagnosis not present

## 2021-08-13 DIAGNOSIS — K222 Esophageal obstruction: Secondary | ICD-10-CM | POA: Diagnosis not present

## 2021-08-13 DIAGNOSIS — F32A Depression, unspecified: Secondary | ICD-10-CM | POA: Diagnosis not present

## 2021-08-13 DIAGNOSIS — R1310 Dysphagia, unspecified: Secondary | ICD-10-CM | POA: Diagnosis not present

## 2021-08-13 DIAGNOSIS — I4891 Unspecified atrial fibrillation: Secondary | ICD-10-CM | POA: Diagnosis not present

## 2021-08-13 DIAGNOSIS — N189 Chronic kidney disease, unspecified: Secondary | ICD-10-CM | POA: Diagnosis not present

## 2021-08-15 DIAGNOSIS — I4891 Unspecified atrial fibrillation: Secondary | ICD-10-CM | POA: Diagnosis not present

## 2021-08-15 DIAGNOSIS — I129 Hypertensive chronic kidney disease with stage 1 through stage 4 chronic kidney disease, or unspecified chronic kidney disease: Secondary | ICD-10-CM | POA: Diagnosis not present

## 2021-08-15 DIAGNOSIS — R1310 Dysphagia, unspecified: Secondary | ICD-10-CM | POA: Diagnosis not present

## 2021-08-15 DIAGNOSIS — F32A Depression, unspecified: Secondary | ICD-10-CM | POA: Diagnosis not present

## 2021-08-15 DIAGNOSIS — K222 Esophageal obstruction: Secondary | ICD-10-CM | POA: Diagnosis not present

## 2021-08-15 DIAGNOSIS — N189 Chronic kidney disease, unspecified: Secondary | ICD-10-CM | POA: Diagnosis not present

## 2021-08-16 DIAGNOSIS — R1310 Dysphagia, unspecified: Secondary | ICD-10-CM | POA: Diagnosis not present

## 2021-08-16 DIAGNOSIS — I129 Hypertensive chronic kidney disease with stage 1 through stage 4 chronic kidney disease, or unspecified chronic kidney disease: Secondary | ICD-10-CM | POA: Diagnosis not present

## 2021-08-16 DIAGNOSIS — K222 Esophageal obstruction: Secondary | ICD-10-CM | POA: Diagnosis not present

## 2021-08-16 DIAGNOSIS — N189 Chronic kidney disease, unspecified: Secondary | ICD-10-CM | POA: Diagnosis not present

## 2021-08-16 DIAGNOSIS — F32A Depression, unspecified: Secondary | ICD-10-CM | POA: Diagnosis not present

## 2021-08-16 DIAGNOSIS — I4891 Unspecified atrial fibrillation: Secondary | ICD-10-CM | POA: Diagnosis not present

## 2021-08-20 DIAGNOSIS — F32A Depression, unspecified: Secondary | ICD-10-CM | POA: Diagnosis not present

## 2021-08-20 DIAGNOSIS — I4891 Unspecified atrial fibrillation: Secondary | ICD-10-CM | POA: Diagnosis not present

## 2021-08-20 DIAGNOSIS — R1310 Dysphagia, unspecified: Secondary | ICD-10-CM | POA: Diagnosis not present

## 2021-08-20 DIAGNOSIS — N189 Chronic kidney disease, unspecified: Secondary | ICD-10-CM | POA: Diagnosis not present

## 2021-08-20 DIAGNOSIS — I129 Hypertensive chronic kidney disease with stage 1 through stage 4 chronic kidney disease, or unspecified chronic kidney disease: Secondary | ICD-10-CM | POA: Diagnosis not present

## 2021-08-20 DIAGNOSIS — K222 Esophageal obstruction: Secondary | ICD-10-CM | POA: Diagnosis not present

## 2021-08-22 DIAGNOSIS — I4891 Unspecified atrial fibrillation: Secondary | ICD-10-CM | POA: Diagnosis not present

## 2021-08-22 DIAGNOSIS — K222 Esophageal obstruction: Secondary | ICD-10-CM | POA: Diagnosis not present

## 2021-08-22 DIAGNOSIS — I129 Hypertensive chronic kidney disease with stage 1 through stage 4 chronic kidney disease, or unspecified chronic kidney disease: Secondary | ICD-10-CM | POA: Diagnosis not present

## 2021-08-22 DIAGNOSIS — F32A Depression, unspecified: Secondary | ICD-10-CM | POA: Diagnosis not present

## 2021-08-22 DIAGNOSIS — R1310 Dysphagia, unspecified: Secondary | ICD-10-CM | POA: Diagnosis not present

## 2021-08-22 DIAGNOSIS — N189 Chronic kidney disease, unspecified: Secondary | ICD-10-CM | POA: Diagnosis not present

## 2021-08-23 DIAGNOSIS — I129 Hypertensive chronic kidney disease with stage 1 through stage 4 chronic kidney disease, or unspecified chronic kidney disease: Secondary | ICD-10-CM | POA: Diagnosis not present

## 2021-08-23 DIAGNOSIS — I4891 Unspecified atrial fibrillation: Secondary | ICD-10-CM | POA: Diagnosis not present

## 2021-08-23 DIAGNOSIS — R1310 Dysphagia, unspecified: Secondary | ICD-10-CM | POA: Diagnosis not present

## 2021-08-23 DIAGNOSIS — N189 Chronic kidney disease, unspecified: Secondary | ICD-10-CM | POA: Diagnosis not present

## 2021-08-23 DIAGNOSIS — F32A Depression, unspecified: Secondary | ICD-10-CM | POA: Diagnosis not present

## 2021-08-23 DIAGNOSIS — K222 Esophageal obstruction: Secondary | ICD-10-CM | POA: Diagnosis not present

## 2021-08-27 DIAGNOSIS — R1310 Dysphagia, unspecified: Secondary | ICD-10-CM | POA: Diagnosis not present

## 2021-08-27 DIAGNOSIS — K222 Esophageal obstruction: Secondary | ICD-10-CM | POA: Diagnosis not present

## 2021-08-27 DIAGNOSIS — I4891 Unspecified atrial fibrillation: Secondary | ICD-10-CM | POA: Diagnosis not present

## 2021-08-27 DIAGNOSIS — N189 Chronic kidney disease, unspecified: Secondary | ICD-10-CM | POA: Diagnosis not present

## 2021-08-27 DIAGNOSIS — F32A Depression, unspecified: Secondary | ICD-10-CM | POA: Diagnosis not present

## 2021-08-27 DIAGNOSIS — I129 Hypertensive chronic kidney disease with stage 1 through stage 4 chronic kidney disease, or unspecified chronic kidney disease: Secondary | ICD-10-CM | POA: Diagnosis not present

## 2021-08-28 DIAGNOSIS — L89152 Pressure ulcer of sacral region, stage 2: Secondary | ICD-10-CM | POA: Diagnosis not present

## 2021-08-28 DIAGNOSIS — G2581 Restless legs syndrome: Secondary | ICD-10-CM | POA: Diagnosis not present

## 2021-08-28 DIAGNOSIS — R159 Full incontinence of feces: Secondary | ICD-10-CM | POA: Diagnosis not present

## 2021-08-28 DIAGNOSIS — M3500 Sicca syndrome, unspecified: Secondary | ICD-10-CM | POA: Diagnosis not present

## 2021-08-28 DIAGNOSIS — F32A Depression, unspecified: Secondary | ICD-10-CM | POA: Diagnosis not present

## 2021-08-28 DIAGNOSIS — N189 Chronic kidney disease, unspecified: Secondary | ICD-10-CM | POA: Diagnosis not present

## 2021-08-28 DIAGNOSIS — K222 Esophageal obstruction: Secondary | ICD-10-CM | POA: Diagnosis not present

## 2021-08-28 DIAGNOSIS — R32 Unspecified urinary incontinence: Secondary | ICD-10-CM | POA: Diagnosis not present

## 2021-08-28 DIAGNOSIS — R1310 Dysphagia, unspecified: Secondary | ICD-10-CM | POA: Diagnosis not present

## 2021-08-28 DIAGNOSIS — Z6820 Body mass index (BMI) 20.0-20.9, adult: Secondary | ICD-10-CM | POA: Diagnosis not present

## 2021-08-28 DIAGNOSIS — R441 Visual hallucinations: Secondary | ICD-10-CM | POA: Diagnosis not present

## 2021-08-28 DIAGNOSIS — K219 Gastro-esophageal reflux disease without esophagitis: Secondary | ICD-10-CM | POA: Diagnosis not present

## 2021-08-28 DIAGNOSIS — F0390 Unspecified dementia without behavioral disturbance: Secondary | ICD-10-CM | POA: Diagnosis not present

## 2021-08-28 DIAGNOSIS — I129 Hypertensive chronic kidney disease with stage 1 through stage 4 chronic kidney disease, or unspecified chronic kidney disease: Secondary | ICD-10-CM | POA: Diagnosis not present

## 2021-08-28 DIAGNOSIS — I4891 Unspecified atrial fibrillation: Secondary | ICD-10-CM | POA: Diagnosis not present

## 2021-08-29 DIAGNOSIS — I129 Hypertensive chronic kidney disease with stage 1 through stage 4 chronic kidney disease, or unspecified chronic kidney disease: Secondary | ICD-10-CM | POA: Diagnosis not present

## 2021-08-29 DIAGNOSIS — I4891 Unspecified atrial fibrillation: Secondary | ICD-10-CM | POA: Diagnosis not present

## 2021-08-29 DIAGNOSIS — R1310 Dysphagia, unspecified: Secondary | ICD-10-CM | POA: Diagnosis not present

## 2021-08-29 DIAGNOSIS — K222 Esophageal obstruction: Secondary | ICD-10-CM | POA: Diagnosis not present

## 2021-08-29 DIAGNOSIS — F32A Depression, unspecified: Secondary | ICD-10-CM | POA: Diagnosis not present

## 2021-08-29 DIAGNOSIS — N189 Chronic kidney disease, unspecified: Secondary | ICD-10-CM | POA: Diagnosis not present

## 2021-09-03 DIAGNOSIS — I129 Hypertensive chronic kidney disease with stage 1 through stage 4 chronic kidney disease, or unspecified chronic kidney disease: Secondary | ICD-10-CM | POA: Diagnosis not present

## 2021-09-03 DIAGNOSIS — H02402 Unspecified ptosis of left eyelid: Secondary | ICD-10-CM | POA: Diagnosis not present

## 2021-09-03 DIAGNOSIS — H18513 Endothelial corneal dystrophy, bilateral: Secondary | ICD-10-CM | POA: Diagnosis not present

## 2021-09-03 DIAGNOSIS — H5213 Myopia, bilateral: Secondary | ICD-10-CM | POA: Diagnosis not present

## 2021-09-03 DIAGNOSIS — I4891 Unspecified atrial fibrillation: Secondary | ICD-10-CM | POA: Diagnosis not present

## 2021-09-03 DIAGNOSIS — H1045 Other chronic allergic conjunctivitis: Secondary | ICD-10-CM | POA: Diagnosis not present

## 2021-09-03 DIAGNOSIS — Z961 Presence of intraocular lens: Secondary | ICD-10-CM | POA: Diagnosis not present

## 2021-09-03 DIAGNOSIS — K222 Esophageal obstruction: Secondary | ICD-10-CM | POA: Diagnosis not present

## 2021-09-03 DIAGNOSIS — R1310 Dysphagia, unspecified: Secondary | ICD-10-CM | POA: Diagnosis not present

## 2021-09-03 DIAGNOSIS — F32A Depression, unspecified: Secondary | ICD-10-CM | POA: Diagnosis not present

## 2021-09-03 DIAGNOSIS — N189 Chronic kidney disease, unspecified: Secondary | ICD-10-CM | POA: Diagnosis not present

## 2021-09-05 DIAGNOSIS — I4891 Unspecified atrial fibrillation: Secondary | ICD-10-CM | POA: Diagnosis not present

## 2021-09-05 DIAGNOSIS — K222 Esophageal obstruction: Secondary | ICD-10-CM | POA: Diagnosis not present

## 2021-09-05 DIAGNOSIS — R1310 Dysphagia, unspecified: Secondary | ICD-10-CM | POA: Diagnosis not present

## 2021-09-05 DIAGNOSIS — N189 Chronic kidney disease, unspecified: Secondary | ICD-10-CM | POA: Diagnosis not present

## 2021-09-05 DIAGNOSIS — I129 Hypertensive chronic kidney disease with stage 1 through stage 4 chronic kidney disease, or unspecified chronic kidney disease: Secondary | ICD-10-CM | POA: Diagnosis not present

## 2021-09-05 DIAGNOSIS — F32A Depression, unspecified: Secondary | ICD-10-CM | POA: Diagnosis not present

## 2021-09-06 DIAGNOSIS — N189 Chronic kidney disease, unspecified: Secondary | ICD-10-CM | POA: Diagnosis not present

## 2021-09-06 DIAGNOSIS — I4891 Unspecified atrial fibrillation: Secondary | ICD-10-CM | POA: Diagnosis not present

## 2021-09-06 DIAGNOSIS — K222 Esophageal obstruction: Secondary | ICD-10-CM | POA: Diagnosis not present

## 2021-09-06 DIAGNOSIS — I129 Hypertensive chronic kidney disease with stage 1 through stage 4 chronic kidney disease, or unspecified chronic kidney disease: Secondary | ICD-10-CM | POA: Diagnosis not present

## 2021-09-06 DIAGNOSIS — R1310 Dysphagia, unspecified: Secondary | ICD-10-CM | POA: Diagnosis not present

## 2021-09-06 DIAGNOSIS — F32A Depression, unspecified: Secondary | ICD-10-CM | POA: Diagnosis not present

## 2021-09-12 DIAGNOSIS — F32A Depression, unspecified: Secondary | ICD-10-CM | POA: Diagnosis not present

## 2021-09-12 DIAGNOSIS — N189 Chronic kidney disease, unspecified: Secondary | ICD-10-CM | POA: Diagnosis not present

## 2021-09-12 DIAGNOSIS — I129 Hypertensive chronic kidney disease with stage 1 through stage 4 chronic kidney disease, or unspecified chronic kidney disease: Secondary | ICD-10-CM | POA: Diagnosis not present

## 2021-09-12 DIAGNOSIS — K222 Esophageal obstruction: Secondary | ICD-10-CM | POA: Diagnosis not present

## 2021-09-12 DIAGNOSIS — R1310 Dysphagia, unspecified: Secondary | ICD-10-CM | POA: Diagnosis not present

## 2021-09-12 DIAGNOSIS — I4891 Unspecified atrial fibrillation: Secondary | ICD-10-CM | POA: Diagnosis not present

## 2021-09-13 DIAGNOSIS — K222 Esophageal obstruction: Secondary | ICD-10-CM | POA: Diagnosis not present

## 2021-09-13 DIAGNOSIS — R1310 Dysphagia, unspecified: Secondary | ICD-10-CM | POA: Diagnosis not present

## 2021-09-13 DIAGNOSIS — I129 Hypertensive chronic kidney disease with stage 1 through stage 4 chronic kidney disease, or unspecified chronic kidney disease: Secondary | ICD-10-CM | POA: Diagnosis not present

## 2021-09-13 DIAGNOSIS — F32A Depression, unspecified: Secondary | ICD-10-CM | POA: Diagnosis not present

## 2021-09-13 DIAGNOSIS — I4891 Unspecified atrial fibrillation: Secondary | ICD-10-CM | POA: Diagnosis not present

## 2021-09-13 DIAGNOSIS — N189 Chronic kidney disease, unspecified: Secondary | ICD-10-CM | POA: Diagnosis not present

## 2021-09-17 DIAGNOSIS — I129 Hypertensive chronic kidney disease with stage 1 through stage 4 chronic kidney disease, or unspecified chronic kidney disease: Secondary | ICD-10-CM | POA: Diagnosis not present

## 2021-09-17 DIAGNOSIS — K222 Esophageal obstruction: Secondary | ICD-10-CM | POA: Diagnosis not present

## 2021-09-17 DIAGNOSIS — I4891 Unspecified atrial fibrillation: Secondary | ICD-10-CM | POA: Diagnosis not present

## 2021-09-17 DIAGNOSIS — F32A Depression, unspecified: Secondary | ICD-10-CM | POA: Diagnosis not present

## 2021-09-17 DIAGNOSIS — N189 Chronic kidney disease, unspecified: Secondary | ICD-10-CM | POA: Diagnosis not present

## 2021-09-17 DIAGNOSIS — R1310 Dysphagia, unspecified: Secondary | ICD-10-CM | POA: Diagnosis not present

## 2021-09-19 DIAGNOSIS — N189 Chronic kidney disease, unspecified: Secondary | ICD-10-CM | POA: Diagnosis not present

## 2021-09-19 DIAGNOSIS — I129 Hypertensive chronic kidney disease with stage 1 through stage 4 chronic kidney disease, or unspecified chronic kidney disease: Secondary | ICD-10-CM | POA: Diagnosis not present

## 2021-09-19 DIAGNOSIS — F32A Depression, unspecified: Secondary | ICD-10-CM | POA: Diagnosis not present

## 2021-09-19 DIAGNOSIS — I4891 Unspecified atrial fibrillation: Secondary | ICD-10-CM | POA: Diagnosis not present

## 2021-09-19 DIAGNOSIS — K222 Esophageal obstruction: Secondary | ICD-10-CM | POA: Diagnosis not present

## 2021-09-19 DIAGNOSIS — R1310 Dysphagia, unspecified: Secondary | ICD-10-CM | POA: Diagnosis not present

## 2021-09-20 DIAGNOSIS — I129 Hypertensive chronic kidney disease with stage 1 through stage 4 chronic kidney disease, or unspecified chronic kidney disease: Secondary | ICD-10-CM | POA: Diagnosis not present

## 2021-09-20 DIAGNOSIS — I4891 Unspecified atrial fibrillation: Secondary | ICD-10-CM | POA: Diagnosis not present

## 2021-09-20 DIAGNOSIS — R1310 Dysphagia, unspecified: Secondary | ICD-10-CM | POA: Diagnosis not present

## 2021-09-20 DIAGNOSIS — K222 Esophageal obstruction: Secondary | ICD-10-CM | POA: Diagnosis not present

## 2021-09-20 DIAGNOSIS — N189 Chronic kidney disease, unspecified: Secondary | ICD-10-CM | POA: Diagnosis not present

## 2021-09-20 DIAGNOSIS — F32A Depression, unspecified: Secondary | ICD-10-CM | POA: Diagnosis not present

## 2021-09-24 DIAGNOSIS — I129 Hypertensive chronic kidney disease with stage 1 through stage 4 chronic kidney disease, or unspecified chronic kidney disease: Secondary | ICD-10-CM | POA: Diagnosis not present

## 2021-09-24 DIAGNOSIS — I4891 Unspecified atrial fibrillation: Secondary | ICD-10-CM | POA: Diagnosis not present

## 2021-09-24 DIAGNOSIS — N189 Chronic kidney disease, unspecified: Secondary | ICD-10-CM | POA: Diagnosis not present

## 2021-09-24 DIAGNOSIS — R1310 Dysphagia, unspecified: Secondary | ICD-10-CM | POA: Diagnosis not present

## 2021-09-24 DIAGNOSIS — K222 Esophageal obstruction: Secondary | ICD-10-CM | POA: Diagnosis not present

## 2021-09-24 DIAGNOSIS — F32A Depression, unspecified: Secondary | ICD-10-CM | POA: Diagnosis not present

## 2021-09-26 DIAGNOSIS — I129 Hypertensive chronic kidney disease with stage 1 through stage 4 chronic kidney disease, or unspecified chronic kidney disease: Secondary | ICD-10-CM | POA: Diagnosis not present

## 2021-09-26 DIAGNOSIS — I4891 Unspecified atrial fibrillation: Secondary | ICD-10-CM | POA: Diagnosis not present

## 2021-09-26 DIAGNOSIS — K222 Esophageal obstruction: Secondary | ICD-10-CM | POA: Diagnosis not present

## 2021-09-26 DIAGNOSIS — F32A Depression, unspecified: Secondary | ICD-10-CM | POA: Diagnosis not present

## 2021-09-26 DIAGNOSIS — R1310 Dysphagia, unspecified: Secondary | ICD-10-CM | POA: Diagnosis not present

## 2021-09-26 DIAGNOSIS — N189 Chronic kidney disease, unspecified: Secondary | ICD-10-CM | POA: Diagnosis not present

## 2021-09-27 DIAGNOSIS — K219 Gastro-esophageal reflux disease without esophagitis: Secondary | ICD-10-CM | POA: Diagnosis not present

## 2021-09-27 DIAGNOSIS — Z6820 Body mass index (BMI) 20.0-20.9, adult: Secondary | ICD-10-CM | POA: Diagnosis not present

## 2021-09-27 DIAGNOSIS — F0390 Unspecified dementia without behavioral disturbance: Secondary | ICD-10-CM | POA: Diagnosis not present

## 2021-09-27 DIAGNOSIS — I4891 Unspecified atrial fibrillation: Secondary | ICD-10-CM | POA: Diagnosis not present

## 2021-09-27 DIAGNOSIS — R32 Unspecified urinary incontinence: Secondary | ICD-10-CM | POA: Diagnosis not present

## 2021-09-27 DIAGNOSIS — L89152 Pressure ulcer of sacral region, stage 2: Secondary | ICD-10-CM | POA: Diagnosis not present

## 2021-09-27 DIAGNOSIS — K222 Esophageal obstruction: Secondary | ICD-10-CM | POA: Diagnosis not present

## 2021-09-27 DIAGNOSIS — F32A Depression, unspecified: Secondary | ICD-10-CM | POA: Diagnosis not present

## 2021-09-27 DIAGNOSIS — I129 Hypertensive chronic kidney disease with stage 1 through stage 4 chronic kidney disease, or unspecified chronic kidney disease: Secondary | ICD-10-CM | POA: Diagnosis not present

## 2021-09-27 DIAGNOSIS — N189 Chronic kidney disease, unspecified: Secondary | ICD-10-CM | POA: Diagnosis not present

## 2021-09-27 DIAGNOSIS — M3500 Sicca syndrome, unspecified: Secondary | ICD-10-CM | POA: Diagnosis not present

## 2021-09-27 DIAGNOSIS — R159 Full incontinence of feces: Secondary | ICD-10-CM | POA: Diagnosis not present

## 2021-09-27 DIAGNOSIS — R441 Visual hallucinations: Secondary | ICD-10-CM | POA: Diagnosis not present

## 2021-09-27 DIAGNOSIS — G2581 Restless legs syndrome: Secondary | ICD-10-CM | POA: Diagnosis not present

## 2021-09-27 DIAGNOSIS — R1310 Dysphagia, unspecified: Secondary | ICD-10-CM | POA: Diagnosis not present

## 2021-10-01 DIAGNOSIS — I4891 Unspecified atrial fibrillation: Secondary | ICD-10-CM | POA: Diagnosis not present

## 2021-10-01 DIAGNOSIS — K222 Esophageal obstruction: Secondary | ICD-10-CM | POA: Diagnosis not present

## 2021-10-01 DIAGNOSIS — R1310 Dysphagia, unspecified: Secondary | ICD-10-CM | POA: Diagnosis not present

## 2021-10-01 DIAGNOSIS — F32A Depression, unspecified: Secondary | ICD-10-CM | POA: Diagnosis not present

## 2021-10-01 DIAGNOSIS — N189 Chronic kidney disease, unspecified: Secondary | ICD-10-CM | POA: Diagnosis not present

## 2021-10-01 DIAGNOSIS — I129 Hypertensive chronic kidney disease with stage 1 through stage 4 chronic kidney disease, or unspecified chronic kidney disease: Secondary | ICD-10-CM | POA: Diagnosis not present

## 2021-10-03 DIAGNOSIS — K222 Esophageal obstruction: Secondary | ICD-10-CM | POA: Diagnosis not present

## 2021-10-03 DIAGNOSIS — N189 Chronic kidney disease, unspecified: Secondary | ICD-10-CM | POA: Diagnosis not present

## 2021-10-03 DIAGNOSIS — F32A Depression, unspecified: Secondary | ICD-10-CM | POA: Diagnosis not present

## 2021-10-03 DIAGNOSIS — I129 Hypertensive chronic kidney disease with stage 1 through stage 4 chronic kidney disease, or unspecified chronic kidney disease: Secondary | ICD-10-CM | POA: Diagnosis not present

## 2021-10-03 DIAGNOSIS — I4891 Unspecified atrial fibrillation: Secondary | ICD-10-CM | POA: Diagnosis not present

## 2021-10-03 DIAGNOSIS — R1310 Dysphagia, unspecified: Secondary | ICD-10-CM | POA: Diagnosis not present

## 2021-10-04 DIAGNOSIS — I129 Hypertensive chronic kidney disease with stage 1 through stage 4 chronic kidney disease, or unspecified chronic kidney disease: Secondary | ICD-10-CM | POA: Diagnosis not present

## 2021-10-04 DIAGNOSIS — K222 Esophageal obstruction: Secondary | ICD-10-CM | POA: Diagnosis not present

## 2021-10-04 DIAGNOSIS — R1310 Dysphagia, unspecified: Secondary | ICD-10-CM | POA: Diagnosis not present

## 2021-10-04 DIAGNOSIS — N189 Chronic kidney disease, unspecified: Secondary | ICD-10-CM | POA: Diagnosis not present

## 2021-10-04 DIAGNOSIS — I4891 Unspecified atrial fibrillation: Secondary | ICD-10-CM | POA: Diagnosis not present

## 2021-10-04 DIAGNOSIS — F32A Depression, unspecified: Secondary | ICD-10-CM | POA: Diagnosis not present

## 2021-10-08 DIAGNOSIS — I4891 Unspecified atrial fibrillation: Secondary | ICD-10-CM | POA: Diagnosis not present

## 2021-10-08 DIAGNOSIS — N189 Chronic kidney disease, unspecified: Secondary | ICD-10-CM | POA: Diagnosis not present

## 2021-10-08 DIAGNOSIS — F32A Depression, unspecified: Secondary | ICD-10-CM | POA: Diagnosis not present

## 2021-10-08 DIAGNOSIS — K222 Esophageal obstruction: Secondary | ICD-10-CM | POA: Diagnosis not present

## 2021-10-08 DIAGNOSIS — I129 Hypertensive chronic kidney disease with stage 1 through stage 4 chronic kidney disease, or unspecified chronic kidney disease: Secondary | ICD-10-CM | POA: Diagnosis not present

## 2021-10-08 DIAGNOSIS — R1310 Dysphagia, unspecified: Secondary | ICD-10-CM | POA: Diagnosis not present

## 2021-10-10 DIAGNOSIS — I129 Hypertensive chronic kidney disease with stage 1 through stage 4 chronic kidney disease, or unspecified chronic kidney disease: Secondary | ICD-10-CM | POA: Diagnosis not present

## 2021-10-10 DIAGNOSIS — K222 Esophageal obstruction: Secondary | ICD-10-CM | POA: Diagnosis not present

## 2021-10-10 DIAGNOSIS — N189 Chronic kidney disease, unspecified: Secondary | ICD-10-CM | POA: Diagnosis not present

## 2021-10-10 DIAGNOSIS — R1310 Dysphagia, unspecified: Secondary | ICD-10-CM | POA: Diagnosis not present

## 2021-10-10 DIAGNOSIS — I4891 Unspecified atrial fibrillation: Secondary | ICD-10-CM | POA: Diagnosis not present

## 2021-10-10 DIAGNOSIS — F32A Depression, unspecified: Secondary | ICD-10-CM | POA: Diagnosis not present

## 2021-10-15 DIAGNOSIS — F32A Depression, unspecified: Secondary | ICD-10-CM | POA: Diagnosis not present

## 2021-10-15 DIAGNOSIS — N189 Chronic kidney disease, unspecified: Secondary | ICD-10-CM | POA: Diagnosis not present

## 2021-10-15 DIAGNOSIS — R1310 Dysphagia, unspecified: Secondary | ICD-10-CM | POA: Diagnosis not present

## 2021-10-15 DIAGNOSIS — K222 Esophageal obstruction: Secondary | ICD-10-CM | POA: Diagnosis not present

## 2021-10-15 DIAGNOSIS — I129 Hypertensive chronic kidney disease with stage 1 through stage 4 chronic kidney disease, or unspecified chronic kidney disease: Secondary | ICD-10-CM | POA: Diagnosis not present

## 2021-10-15 DIAGNOSIS — I4891 Unspecified atrial fibrillation: Secondary | ICD-10-CM | POA: Diagnosis not present

## 2021-10-16 DIAGNOSIS — N189 Chronic kidney disease, unspecified: Secondary | ICD-10-CM | POA: Diagnosis not present

## 2021-10-16 DIAGNOSIS — F32A Depression, unspecified: Secondary | ICD-10-CM | POA: Diagnosis not present

## 2021-10-16 DIAGNOSIS — I129 Hypertensive chronic kidney disease with stage 1 through stage 4 chronic kidney disease, or unspecified chronic kidney disease: Secondary | ICD-10-CM | POA: Diagnosis not present

## 2021-10-16 DIAGNOSIS — K222 Esophageal obstruction: Secondary | ICD-10-CM | POA: Diagnosis not present

## 2021-10-16 DIAGNOSIS — I4891 Unspecified atrial fibrillation: Secondary | ICD-10-CM | POA: Diagnosis not present

## 2021-10-16 DIAGNOSIS — R1310 Dysphagia, unspecified: Secondary | ICD-10-CM | POA: Diagnosis not present

## 2021-10-17 DIAGNOSIS — F32A Depression, unspecified: Secondary | ICD-10-CM | POA: Diagnosis not present

## 2021-10-17 DIAGNOSIS — N189 Chronic kidney disease, unspecified: Secondary | ICD-10-CM | POA: Diagnosis not present

## 2021-10-17 DIAGNOSIS — I129 Hypertensive chronic kidney disease with stage 1 through stage 4 chronic kidney disease, or unspecified chronic kidney disease: Secondary | ICD-10-CM | POA: Diagnosis not present

## 2021-10-17 DIAGNOSIS — R1310 Dysphagia, unspecified: Secondary | ICD-10-CM | POA: Diagnosis not present

## 2021-10-17 DIAGNOSIS — I4891 Unspecified atrial fibrillation: Secondary | ICD-10-CM | POA: Diagnosis not present

## 2021-10-17 DIAGNOSIS — K222 Esophageal obstruction: Secondary | ICD-10-CM | POA: Diagnosis not present

## 2021-10-18 DIAGNOSIS — R1310 Dysphagia, unspecified: Secondary | ICD-10-CM | POA: Diagnosis not present

## 2021-10-18 DIAGNOSIS — F32A Depression, unspecified: Secondary | ICD-10-CM | POA: Diagnosis not present

## 2021-10-18 DIAGNOSIS — I4891 Unspecified atrial fibrillation: Secondary | ICD-10-CM | POA: Diagnosis not present

## 2021-10-18 DIAGNOSIS — K222 Esophageal obstruction: Secondary | ICD-10-CM | POA: Diagnosis not present

## 2021-10-18 DIAGNOSIS — N189 Chronic kidney disease, unspecified: Secondary | ICD-10-CM | POA: Diagnosis not present

## 2021-10-18 DIAGNOSIS — I129 Hypertensive chronic kidney disease with stage 1 through stage 4 chronic kidney disease, or unspecified chronic kidney disease: Secondary | ICD-10-CM | POA: Diagnosis not present

## 2021-10-22 DIAGNOSIS — K222 Esophageal obstruction: Secondary | ICD-10-CM | POA: Diagnosis not present

## 2021-10-22 DIAGNOSIS — I129 Hypertensive chronic kidney disease with stage 1 through stage 4 chronic kidney disease, or unspecified chronic kidney disease: Secondary | ICD-10-CM | POA: Diagnosis not present

## 2021-10-22 DIAGNOSIS — N189 Chronic kidney disease, unspecified: Secondary | ICD-10-CM | POA: Diagnosis not present

## 2021-10-22 DIAGNOSIS — F32A Depression, unspecified: Secondary | ICD-10-CM | POA: Diagnosis not present

## 2021-10-22 DIAGNOSIS — R1310 Dysphagia, unspecified: Secondary | ICD-10-CM | POA: Diagnosis not present

## 2021-10-22 DIAGNOSIS — I4891 Unspecified atrial fibrillation: Secondary | ICD-10-CM | POA: Diagnosis not present

## 2021-10-24 DIAGNOSIS — F32A Depression, unspecified: Secondary | ICD-10-CM | POA: Diagnosis not present

## 2021-10-24 DIAGNOSIS — R1310 Dysphagia, unspecified: Secondary | ICD-10-CM | POA: Diagnosis not present

## 2021-10-24 DIAGNOSIS — I4891 Unspecified atrial fibrillation: Secondary | ICD-10-CM | POA: Diagnosis not present

## 2021-10-24 DIAGNOSIS — I129 Hypertensive chronic kidney disease with stage 1 through stage 4 chronic kidney disease, or unspecified chronic kidney disease: Secondary | ICD-10-CM | POA: Diagnosis not present

## 2021-10-24 DIAGNOSIS — K222 Esophageal obstruction: Secondary | ICD-10-CM | POA: Diagnosis not present

## 2021-10-24 DIAGNOSIS — N189 Chronic kidney disease, unspecified: Secondary | ICD-10-CM | POA: Diagnosis not present

## 2021-10-28 DIAGNOSIS — R1310 Dysphagia, unspecified: Secondary | ICD-10-CM | POA: Diagnosis not present

## 2021-10-28 DIAGNOSIS — Z6820 Body mass index (BMI) 20.0-20.9, adult: Secondary | ICD-10-CM | POA: Diagnosis not present

## 2021-10-28 DIAGNOSIS — I129 Hypertensive chronic kidney disease with stage 1 through stage 4 chronic kidney disease, or unspecified chronic kidney disease: Secondary | ICD-10-CM | POA: Diagnosis not present

## 2021-10-28 DIAGNOSIS — R32 Unspecified urinary incontinence: Secondary | ICD-10-CM | POA: Diagnosis not present

## 2021-10-28 DIAGNOSIS — K219 Gastro-esophageal reflux disease without esophagitis: Secondary | ICD-10-CM | POA: Diagnosis not present

## 2021-10-28 DIAGNOSIS — K222 Esophageal obstruction: Secondary | ICD-10-CM | POA: Diagnosis not present

## 2021-10-28 DIAGNOSIS — I4891 Unspecified atrial fibrillation: Secondary | ICD-10-CM | POA: Diagnosis not present

## 2021-10-28 DIAGNOSIS — R159 Full incontinence of feces: Secondary | ICD-10-CM | POA: Diagnosis not present

## 2021-10-28 DIAGNOSIS — L89152 Pressure ulcer of sacral region, stage 2: Secondary | ICD-10-CM | POA: Diagnosis not present

## 2021-10-28 DIAGNOSIS — G2581 Restless legs syndrome: Secondary | ICD-10-CM | POA: Diagnosis not present

## 2021-10-28 DIAGNOSIS — F32A Depression, unspecified: Secondary | ICD-10-CM | POA: Diagnosis not present

## 2021-10-28 DIAGNOSIS — R441 Visual hallucinations: Secondary | ICD-10-CM | POA: Diagnosis not present

## 2021-10-28 DIAGNOSIS — F0390 Unspecified dementia without behavioral disturbance: Secondary | ICD-10-CM | POA: Diagnosis not present

## 2021-10-28 DIAGNOSIS — N189 Chronic kidney disease, unspecified: Secondary | ICD-10-CM | POA: Diagnosis not present

## 2021-10-28 DIAGNOSIS — M3500 Sicca syndrome, unspecified: Secondary | ICD-10-CM | POA: Diagnosis not present

## 2021-10-29 DIAGNOSIS — N189 Chronic kidney disease, unspecified: Secondary | ICD-10-CM | POA: Diagnosis not present

## 2021-10-29 DIAGNOSIS — I4891 Unspecified atrial fibrillation: Secondary | ICD-10-CM | POA: Diagnosis not present

## 2021-10-29 DIAGNOSIS — K222 Esophageal obstruction: Secondary | ICD-10-CM | POA: Diagnosis not present

## 2021-10-29 DIAGNOSIS — F32A Depression, unspecified: Secondary | ICD-10-CM | POA: Diagnosis not present

## 2021-10-29 DIAGNOSIS — I129 Hypertensive chronic kidney disease with stage 1 through stage 4 chronic kidney disease, or unspecified chronic kidney disease: Secondary | ICD-10-CM | POA: Diagnosis not present

## 2021-10-29 DIAGNOSIS — R1310 Dysphagia, unspecified: Secondary | ICD-10-CM | POA: Diagnosis not present

## 2021-10-31 DIAGNOSIS — K222 Esophageal obstruction: Secondary | ICD-10-CM | POA: Diagnosis not present

## 2021-10-31 DIAGNOSIS — R1310 Dysphagia, unspecified: Secondary | ICD-10-CM | POA: Diagnosis not present

## 2021-10-31 DIAGNOSIS — I129 Hypertensive chronic kidney disease with stage 1 through stage 4 chronic kidney disease, or unspecified chronic kidney disease: Secondary | ICD-10-CM | POA: Diagnosis not present

## 2021-10-31 DIAGNOSIS — N189 Chronic kidney disease, unspecified: Secondary | ICD-10-CM | POA: Diagnosis not present

## 2021-10-31 DIAGNOSIS — I4891 Unspecified atrial fibrillation: Secondary | ICD-10-CM | POA: Diagnosis not present

## 2021-10-31 DIAGNOSIS — F32A Depression, unspecified: Secondary | ICD-10-CM | POA: Diagnosis not present

## 2021-11-01 DIAGNOSIS — R1310 Dysphagia, unspecified: Secondary | ICD-10-CM | POA: Diagnosis not present

## 2021-11-01 DIAGNOSIS — I4891 Unspecified atrial fibrillation: Secondary | ICD-10-CM | POA: Diagnosis not present

## 2021-11-01 DIAGNOSIS — K222 Esophageal obstruction: Secondary | ICD-10-CM | POA: Diagnosis not present

## 2021-11-01 DIAGNOSIS — I129 Hypertensive chronic kidney disease with stage 1 through stage 4 chronic kidney disease, or unspecified chronic kidney disease: Secondary | ICD-10-CM | POA: Diagnosis not present

## 2021-11-01 DIAGNOSIS — F32A Depression, unspecified: Secondary | ICD-10-CM | POA: Diagnosis not present

## 2021-11-01 DIAGNOSIS — N189 Chronic kidney disease, unspecified: Secondary | ICD-10-CM | POA: Diagnosis not present

## 2021-11-05 DIAGNOSIS — K222 Esophageal obstruction: Secondary | ICD-10-CM | POA: Diagnosis not present

## 2021-11-05 DIAGNOSIS — I4891 Unspecified atrial fibrillation: Secondary | ICD-10-CM | POA: Diagnosis not present

## 2021-11-05 DIAGNOSIS — N189 Chronic kidney disease, unspecified: Secondary | ICD-10-CM | POA: Diagnosis not present

## 2021-11-05 DIAGNOSIS — R1310 Dysphagia, unspecified: Secondary | ICD-10-CM | POA: Diagnosis not present

## 2021-11-05 DIAGNOSIS — I129 Hypertensive chronic kidney disease with stage 1 through stage 4 chronic kidney disease, or unspecified chronic kidney disease: Secondary | ICD-10-CM | POA: Diagnosis not present

## 2021-11-05 DIAGNOSIS — F32A Depression, unspecified: Secondary | ICD-10-CM | POA: Diagnosis not present

## 2021-11-07 DIAGNOSIS — K222 Esophageal obstruction: Secondary | ICD-10-CM | POA: Diagnosis not present

## 2021-11-07 DIAGNOSIS — F32A Depression, unspecified: Secondary | ICD-10-CM | POA: Diagnosis not present

## 2021-11-07 DIAGNOSIS — R1310 Dysphagia, unspecified: Secondary | ICD-10-CM | POA: Diagnosis not present

## 2021-11-07 DIAGNOSIS — I129 Hypertensive chronic kidney disease with stage 1 through stage 4 chronic kidney disease, or unspecified chronic kidney disease: Secondary | ICD-10-CM | POA: Diagnosis not present

## 2021-11-07 DIAGNOSIS — N189 Chronic kidney disease, unspecified: Secondary | ICD-10-CM | POA: Diagnosis not present

## 2021-11-07 DIAGNOSIS — I4891 Unspecified atrial fibrillation: Secondary | ICD-10-CM | POA: Diagnosis not present

## 2021-11-08 DIAGNOSIS — R1310 Dysphagia, unspecified: Secondary | ICD-10-CM | POA: Diagnosis not present

## 2021-11-08 DIAGNOSIS — I129 Hypertensive chronic kidney disease with stage 1 through stage 4 chronic kidney disease, or unspecified chronic kidney disease: Secondary | ICD-10-CM | POA: Diagnosis not present

## 2021-11-08 DIAGNOSIS — I4891 Unspecified atrial fibrillation: Secondary | ICD-10-CM | POA: Diagnosis not present

## 2021-11-08 DIAGNOSIS — N189 Chronic kidney disease, unspecified: Secondary | ICD-10-CM | POA: Diagnosis not present

## 2021-11-08 DIAGNOSIS — F32A Depression, unspecified: Secondary | ICD-10-CM | POA: Diagnosis not present

## 2021-11-08 DIAGNOSIS — K222 Esophageal obstruction: Secondary | ICD-10-CM | POA: Diagnosis not present

## 2021-11-12 DIAGNOSIS — K222 Esophageal obstruction: Secondary | ICD-10-CM | POA: Diagnosis not present

## 2021-11-12 DIAGNOSIS — R1310 Dysphagia, unspecified: Secondary | ICD-10-CM | POA: Diagnosis not present

## 2021-11-12 DIAGNOSIS — I129 Hypertensive chronic kidney disease with stage 1 through stage 4 chronic kidney disease, or unspecified chronic kidney disease: Secondary | ICD-10-CM | POA: Diagnosis not present

## 2021-11-12 DIAGNOSIS — F32A Depression, unspecified: Secondary | ICD-10-CM | POA: Diagnosis not present

## 2021-11-12 DIAGNOSIS — I4891 Unspecified atrial fibrillation: Secondary | ICD-10-CM | POA: Diagnosis not present

## 2021-11-12 DIAGNOSIS — N189 Chronic kidney disease, unspecified: Secondary | ICD-10-CM | POA: Diagnosis not present

## 2021-11-14 DIAGNOSIS — F32A Depression, unspecified: Secondary | ICD-10-CM | POA: Diagnosis not present

## 2021-11-14 DIAGNOSIS — K222 Esophageal obstruction: Secondary | ICD-10-CM | POA: Diagnosis not present

## 2021-11-14 DIAGNOSIS — I129 Hypertensive chronic kidney disease with stage 1 through stage 4 chronic kidney disease, or unspecified chronic kidney disease: Secondary | ICD-10-CM | POA: Diagnosis not present

## 2021-11-14 DIAGNOSIS — I4891 Unspecified atrial fibrillation: Secondary | ICD-10-CM | POA: Diagnosis not present

## 2021-11-14 DIAGNOSIS — R1310 Dysphagia, unspecified: Secondary | ICD-10-CM | POA: Diagnosis not present

## 2021-11-14 DIAGNOSIS — N189 Chronic kidney disease, unspecified: Secondary | ICD-10-CM | POA: Diagnosis not present

## 2021-11-15 DIAGNOSIS — K222 Esophageal obstruction: Secondary | ICD-10-CM | POA: Diagnosis not present

## 2021-11-15 DIAGNOSIS — F32A Depression, unspecified: Secondary | ICD-10-CM | POA: Diagnosis not present

## 2021-11-15 DIAGNOSIS — N189 Chronic kidney disease, unspecified: Secondary | ICD-10-CM | POA: Diagnosis not present

## 2021-11-15 DIAGNOSIS — R1310 Dysphagia, unspecified: Secondary | ICD-10-CM | POA: Diagnosis not present

## 2021-11-15 DIAGNOSIS — I129 Hypertensive chronic kidney disease with stage 1 through stage 4 chronic kidney disease, or unspecified chronic kidney disease: Secondary | ICD-10-CM | POA: Diagnosis not present

## 2021-11-15 DIAGNOSIS — I4891 Unspecified atrial fibrillation: Secondary | ICD-10-CM | POA: Diagnosis not present

## 2021-11-19 DIAGNOSIS — N189 Chronic kidney disease, unspecified: Secondary | ICD-10-CM | POA: Diagnosis not present

## 2021-11-19 DIAGNOSIS — I4891 Unspecified atrial fibrillation: Secondary | ICD-10-CM | POA: Diagnosis not present

## 2021-11-19 DIAGNOSIS — K222 Esophageal obstruction: Secondary | ICD-10-CM | POA: Diagnosis not present

## 2021-11-19 DIAGNOSIS — F32A Depression, unspecified: Secondary | ICD-10-CM | POA: Diagnosis not present

## 2021-11-19 DIAGNOSIS — R1310 Dysphagia, unspecified: Secondary | ICD-10-CM | POA: Diagnosis not present

## 2021-11-19 DIAGNOSIS — I129 Hypertensive chronic kidney disease with stage 1 through stage 4 chronic kidney disease, or unspecified chronic kidney disease: Secondary | ICD-10-CM | POA: Diagnosis not present

## 2021-11-21 DIAGNOSIS — I4891 Unspecified atrial fibrillation: Secondary | ICD-10-CM | POA: Diagnosis not present

## 2021-11-21 DIAGNOSIS — K222 Esophageal obstruction: Secondary | ICD-10-CM | POA: Diagnosis not present

## 2021-11-21 DIAGNOSIS — I129 Hypertensive chronic kidney disease with stage 1 through stage 4 chronic kidney disease, or unspecified chronic kidney disease: Secondary | ICD-10-CM | POA: Diagnosis not present

## 2021-11-21 DIAGNOSIS — F32A Depression, unspecified: Secondary | ICD-10-CM | POA: Diagnosis not present

## 2021-11-21 DIAGNOSIS — N189 Chronic kidney disease, unspecified: Secondary | ICD-10-CM | POA: Diagnosis not present

## 2021-11-21 DIAGNOSIS — R1310 Dysphagia, unspecified: Secondary | ICD-10-CM | POA: Diagnosis not present

## 2021-11-22 DIAGNOSIS — I129 Hypertensive chronic kidney disease with stage 1 through stage 4 chronic kidney disease, or unspecified chronic kidney disease: Secondary | ICD-10-CM | POA: Diagnosis not present

## 2021-11-22 DIAGNOSIS — N189 Chronic kidney disease, unspecified: Secondary | ICD-10-CM | POA: Diagnosis not present

## 2021-11-22 DIAGNOSIS — R1310 Dysphagia, unspecified: Secondary | ICD-10-CM | POA: Diagnosis not present

## 2021-11-22 DIAGNOSIS — I4891 Unspecified atrial fibrillation: Secondary | ICD-10-CM | POA: Diagnosis not present

## 2021-11-22 DIAGNOSIS — F32A Depression, unspecified: Secondary | ICD-10-CM | POA: Diagnosis not present

## 2021-11-22 DIAGNOSIS — K222 Esophageal obstruction: Secondary | ICD-10-CM | POA: Diagnosis not present

## 2021-11-26 DIAGNOSIS — I4891 Unspecified atrial fibrillation: Secondary | ICD-10-CM | POA: Diagnosis not present

## 2021-11-26 DIAGNOSIS — R1310 Dysphagia, unspecified: Secondary | ICD-10-CM | POA: Diagnosis not present

## 2021-11-26 DIAGNOSIS — F32A Depression, unspecified: Secondary | ICD-10-CM | POA: Diagnosis not present

## 2021-11-26 DIAGNOSIS — N189 Chronic kidney disease, unspecified: Secondary | ICD-10-CM | POA: Diagnosis not present

## 2021-11-26 DIAGNOSIS — K222 Esophageal obstruction: Secondary | ICD-10-CM | POA: Diagnosis not present

## 2021-11-26 DIAGNOSIS — I129 Hypertensive chronic kidney disease with stage 1 through stage 4 chronic kidney disease, or unspecified chronic kidney disease: Secondary | ICD-10-CM | POA: Diagnosis not present

## 2021-11-28 DIAGNOSIS — R159 Full incontinence of feces: Secondary | ICD-10-CM | POA: Diagnosis not present

## 2021-11-28 DIAGNOSIS — N189 Chronic kidney disease, unspecified: Secondary | ICD-10-CM | POA: Diagnosis not present

## 2021-11-28 DIAGNOSIS — M3500 Sicca syndrome, unspecified: Secondary | ICD-10-CM | POA: Diagnosis not present

## 2021-11-28 DIAGNOSIS — R32 Unspecified urinary incontinence: Secondary | ICD-10-CM | POA: Diagnosis not present

## 2021-11-28 DIAGNOSIS — R1310 Dysphagia, unspecified: Secondary | ICD-10-CM | POA: Diagnosis not present

## 2021-11-28 DIAGNOSIS — K219 Gastro-esophageal reflux disease without esophagitis: Secondary | ICD-10-CM | POA: Diagnosis not present

## 2021-11-28 DIAGNOSIS — K222 Esophageal obstruction: Secondary | ICD-10-CM | POA: Diagnosis not present

## 2021-11-28 DIAGNOSIS — I129 Hypertensive chronic kidney disease with stage 1 through stage 4 chronic kidney disease, or unspecified chronic kidney disease: Secondary | ICD-10-CM | POA: Diagnosis not present

## 2021-11-28 DIAGNOSIS — F0393 Unspecified dementia, unspecified severity, with mood disturbance: Secondary | ICD-10-CM | POA: Diagnosis not present

## 2021-11-28 DIAGNOSIS — L89152 Pressure ulcer of sacral region, stage 2: Secondary | ICD-10-CM | POA: Diagnosis not present

## 2021-11-28 DIAGNOSIS — Z6820 Body mass index (BMI) 20.0-20.9, adult: Secondary | ICD-10-CM | POA: Diagnosis not present

## 2021-11-28 DIAGNOSIS — I4891 Unspecified atrial fibrillation: Secondary | ICD-10-CM | POA: Diagnosis not present

## 2021-11-28 DIAGNOSIS — G2581 Restless legs syndrome: Secondary | ICD-10-CM | POA: Diagnosis not present

## 2021-11-28 DIAGNOSIS — F0392 Unspecified dementia, unspecified severity, with psychotic disturbance: Secondary | ICD-10-CM | POA: Diagnosis not present

## 2021-11-29 DIAGNOSIS — R1310 Dysphagia, unspecified: Secondary | ICD-10-CM | POA: Diagnosis not present

## 2021-11-29 DIAGNOSIS — K219 Gastro-esophageal reflux disease without esophagitis: Secondary | ICD-10-CM | POA: Diagnosis not present

## 2021-11-29 DIAGNOSIS — I129 Hypertensive chronic kidney disease with stage 1 through stage 4 chronic kidney disease, or unspecified chronic kidney disease: Secondary | ICD-10-CM | POA: Diagnosis not present

## 2021-11-29 DIAGNOSIS — K222 Esophageal obstruction: Secondary | ICD-10-CM | POA: Diagnosis not present

## 2021-11-29 DIAGNOSIS — I4891 Unspecified atrial fibrillation: Secondary | ICD-10-CM | POA: Diagnosis not present

## 2021-11-29 DIAGNOSIS — N189 Chronic kidney disease, unspecified: Secondary | ICD-10-CM | POA: Diagnosis not present

## 2021-12-03 DIAGNOSIS — K222 Esophageal obstruction: Secondary | ICD-10-CM | POA: Diagnosis not present

## 2021-12-03 DIAGNOSIS — N189 Chronic kidney disease, unspecified: Secondary | ICD-10-CM | POA: Diagnosis not present

## 2021-12-03 DIAGNOSIS — I4891 Unspecified atrial fibrillation: Secondary | ICD-10-CM | POA: Diagnosis not present

## 2021-12-03 DIAGNOSIS — R1310 Dysphagia, unspecified: Secondary | ICD-10-CM | POA: Diagnosis not present

## 2021-12-03 DIAGNOSIS — I129 Hypertensive chronic kidney disease with stage 1 through stage 4 chronic kidney disease, or unspecified chronic kidney disease: Secondary | ICD-10-CM | POA: Diagnosis not present

## 2021-12-03 DIAGNOSIS — K219 Gastro-esophageal reflux disease without esophagitis: Secondary | ICD-10-CM | POA: Diagnosis not present

## 2021-12-05 DIAGNOSIS — N189 Chronic kidney disease, unspecified: Secondary | ICD-10-CM | POA: Diagnosis not present

## 2021-12-05 DIAGNOSIS — I129 Hypertensive chronic kidney disease with stage 1 through stage 4 chronic kidney disease, or unspecified chronic kidney disease: Secondary | ICD-10-CM | POA: Diagnosis not present

## 2021-12-05 DIAGNOSIS — I4891 Unspecified atrial fibrillation: Secondary | ICD-10-CM | POA: Diagnosis not present

## 2021-12-05 DIAGNOSIS — R1310 Dysphagia, unspecified: Secondary | ICD-10-CM | POA: Diagnosis not present

## 2021-12-05 DIAGNOSIS — K222 Esophageal obstruction: Secondary | ICD-10-CM | POA: Diagnosis not present

## 2021-12-05 DIAGNOSIS — K219 Gastro-esophageal reflux disease without esophagitis: Secondary | ICD-10-CM | POA: Diagnosis not present

## 2021-12-06 DIAGNOSIS — K219 Gastro-esophageal reflux disease without esophagitis: Secondary | ICD-10-CM | POA: Diagnosis not present

## 2021-12-06 DIAGNOSIS — R1310 Dysphagia, unspecified: Secondary | ICD-10-CM | POA: Diagnosis not present

## 2021-12-06 DIAGNOSIS — I4891 Unspecified atrial fibrillation: Secondary | ICD-10-CM | POA: Diagnosis not present

## 2021-12-06 DIAGNOSIS — I129 Hypertensive chronic kidney disease with stage 1 through stage 4 chronic kidney disease, or unspecified chronic kidney disease: Secondary | ICD-10-CM | POA: Diagnosis not present

## 2021-12-06 DIAGNOSIS — N189 Chronic kidney disease, unspecified: Secondary | ICD-10-CM | POA: Diagnosis not present

## 2021-12-06 DIAGNOSIS — K222 Esophageal obstruction: Secondary | ICD-10-CM | POA: Diagnosis not present

## 2021-12-10 DIAGNOSIS — K222 Esophageal obstruction: Secondary | ICD-10-CM | POA: Diagnosis not present

## 2021-12-10 DIAGNOSIS — R1310 Dysphagia, unspecified: Secondary | ICD-10-CM | POA: Diagnosis not present

## 2021-12-10 DIAGNOSIS — I129 Hypertensive chronic kidney disease with stage 1 through stage 4 chronic kidney disease, or unspecified chronic kidney disease: Secondary | ICD-10-CM | POA: Diagnosis not present

## 2021-12-10 DIAGNOSIS — K219 Gastro-esophageal reflux disease without esophagitis: Secondary | ICD-10-CM | POA: Diagnosis not present

## 2021-12-10 DIAGNOSIS — I4891 Unspecified atrial fibrillation: Secondary | ICD-10-CM | POA: Diagnosis not present

## 2021-12-10 DIAGNOSIS — N189 Chronic kidney disease, unspecified: Secondary | ICD-10-CM | POA: Diagnosis not present

## 2021-12-12 DIAGNOSIS — N189 Chronic kidney disease, unspecified: Secondary | ICD-10-CM | POA: Diagnosis not present

## 2021-12-12 DIAGNOSIS — K219 Gastro-esophageal reflux disease without esophagitis: Secondary | ICD-10-CM | POA: Diagnosis not present

## 2021-12-12 DIAGNOSIS — R1310 Dysphagia, unspecified: Secondary | ICD-10-CM | POA: Diagnosis not present

## 2021-12-12 DIAGNOSIS — K222 Esophageal obstruction: Secondary | ICD-10-CM | POA: Diagnosis not present

## 2021-12-12 DIAGNOSIS — I129 Hypertensive chronic kidney disease with stage 1 through stage 4 chronic kidney disease, or unspecified chronic kidney disease: Secondary | ICD-10-CM | POA: Diagnosis not present

## 2021-12-12 DIAGNOSIS — I4891 Unspecified atrial fibrillation: Secondary | ICD-10-CM | POA: Diagnosis not present

## 2021-12-13 DIAGNOSIS — N189 Chronic kidney disease, unspecified: Secondary | ICD-10-CM | POA: Diagnosis not present

## 2021-12-13 DIAGNOSIS — K222 Esophageal obstruction: Secondary | ICD-10-CM | POA: Diagnosis not present

## 2021-12-13 DIAGNOSIS — I4891 Unspecified atrial fibrillation: Secondary | ICD-10-CM | POA: Diagnosis not present

## 2021-12-13 DIAGNOSIS — I129 Hypertensive chronic kidney disease with stage 1 through stage 4 chronic kidney disease, or unspecified chronic kidney disease: Secondary | ICD-10-CM | POA: Diagnosis not present

## 2021-12-13 DIAGNOSIS — R1310 Dysphagia, unspecified: Secondary | ICD-10-CM | POA: Diagnosis not present

## 2021-12-13 DIAGNOSIS — K219 Gastro-esophageal reflux disease without esophagitis: Secondary | ICD-10-CM | POA: Diagnosis not present

## 2021-12-17 DIAGNOSIS — K222 Esophageal obstruction: Secondary | ICD-10-CM | POA: Diagnosis not present

## 2021-12-17 DIAGNOSIS — K219 Gastro-esophageal reflux disease without esophagitis: Secondary | ICD-10-CM | POA: Diagnosis not present

## 2021-12-17 DIAGNOSIS — I4891 Unspecified atrial fibrillation: Secondary | ICD-10-CM | POA: Diagnosis not present

## 2021-12-17 DIAGNOSIS — N189 Chronic kidney disease, unspecified: Secondary | ICD-10-CM | POA: Diagnosis not present

## 2021-12-17 DIAGNOSIS — R1310 Dysphagia, unspecified: Secondary | ICD-10-CM | POA: Diagnosis not present

## 2021-12-17 DIAGNOSIS — I129 Hypertensive chronic kidney disease with stage 1 through stage 4 chronic kidney disease, or unspecified chronic kidney disease: Secondary | ICD-10-CM | POA: Diagnosis not present

## 2021-12-19 DIAGNOSIS — N189 Chronic kidney disease, unspecified: Secondary | ICD-10-CM | POA: Diagnosis not present

## 2021-12-19 DIAGNOSIS — I129 Hypertensive chronic kidney disease with stage 1 through stage 4 chronic kidney disease, or unspecified chronic kidney disease: Secondary | ICD-10-CM | POA: Diagnosis not present

## 2021-12-19 DIAGNOSIS — K219 Gastro-esophageal reflux disease without esophagitis: Secondary | ICD-10-CM | POA: Diagnosis not present

## 2021-12-19 DIAGNOSIS — K222 Esophageal obstruction: Secondary | ICD-10-CM | POA: Diagnosis not present

## 2021-12-19 DIAGNOSIS — R1310 Dysphagia, unspecified: Secondary | ICD-10-CM | POA: Diagnosis not present

## 2021-12-19 DIAGNOSIS — I4891 Unspecified atrial fibrillation: Secondary | ICD-10-CM | POA: Diagnosis not present

## 2021-12-20 DIAGNOSIS — I129 Hypertensive chronic kidney disease with stage 1 through stage 4 chronic kidney disease, or unspecified chronic kidney disease: Secondary | ICD-10-CM | POA: Diagnosis not present

## 2021-12-20 DIAGNOSIS — R1310 Dysphagia, unspecified: Secondary | ICD-10-CM | POA: Diagnosis not present

## 2021-12-20 DIAGNOSIS — N189 Chronic kidney disease, unspecified: Secondary | ICD-10-CM | POA: Diagnosis not present

## 2021-12-20 DIAGNOSIS — K222 Esophageal obstruction: Secondary | ICD-10-CM | POA: Diagnosis not present

## 2021-12-20 DIAGNOSIS — K219 Gastro-esophageal reflux disease without esophagitis: Secondary | ICD-10-CM | POA: Diagnosis not present

## 2021-12-20 DIAGNOSIS — I4891 Unspecified atrial fibrillation: Secondary | ICD-10-CM | POA: Diagnosis not present

## 2021-12-24 DIAGNOSIS — K222 Esophageal obstruction: Secondary | ICD-10-CM | POA: Diagnosis not present

## 2021-12-24 DIAGNOSIS — I129 Hypertensive chronic kidney disease with stage 1 through stage 4 chronic kidney disease, or unspecified chronic kidney disease: Secondary | ICD-10-CM | POA: Diagnosis not present

## 2021-12-24 DIAGNOSIS — K219 Gastro-esophageal reflux disease without esophagitis: Secondary | ICD-10-CM | POA: Diagnosis not present

## 2021-12-24 DIAGNOSIS — N189 Chronic kidney disease, unspecified: Secondary | ICD-10-CM | POA: Diagnosis not present

## 2021-12-24 DIAGNOSIS — R1310 Dysphagia, unspecified: Secondary | ICD-10-CM | POA: Diagnosis not present

## 2021-12-24 DIAGNOSIS — I4891 Unspecified atrial fibrillation: Secondary | ICD-10-CM | POA: Diagnosis not present

## 2021-12-26 DIAGNOSIS — R1310 Dysphagia, unspecified: Secondary | ICD-10-CM | POA: Diagnosis not present

## 2021-12-26 DIAGNOSIS — Z6820 Body mass index (BMI) 20.0-20.9, adult: Secondary | ICD-10-CM | POA: Diagnosis not present

## 2021-12-26 DIAGNOSIS — F0393 Unspecified dementia, unspecified severity, with mood disturbance: Secondary | ICD-10-CM | POA: Diagnosis not present

## 2021-12-26 DIAGNOSIS — F0392 Unspecified dementia, unspecified severity, with psychotic disturbance: Secondary | ICD-10-CM | POA: Diagnosis not present

## 2021-12-26 DIAGNOSIS — K219 Gastro-esophageal reflux disease without esophagitis: Secondary | ICD-10-CM | POA: Diagnosis not present

## 2021-12-26 DIAGNOSIS — K222 Esophageal obstruction: Secondary | ICD-10-CM | POA: Diagnosis not present

## 2021-12-26 DIAGNOSIS — G2581 Restless legs syndrome: Secondary | ICD-10-CM | POA: Diagnosis not present

## 2021-12-26 DIAGNOSIS — M2041 Other hammer toe(s) (acquired), right foot: Secondary | ICD-10-CM | POA: Diagnosis not present

## 2021-12-26 DIAGNOSIS — L89152 Pressure ulcer of sacral region, stage 2: Secondary | ICD-10-CM | POA: Diagnosis not present

## 2021-12-26 DIAGNOSIS — I4891 Unspecified atrial fibrillation: Secondary | ICD-10-CM | POA: Diagnosis not present

## 2021-12-26 DIAGNOSIS — B351 Tinea unguium: Secondary | ICD-10-CM | POA: Diagnosis not present

## 2021-12-26 DIAGNOSIS — L84 Corns and callosities: Secondary | ICD-10-CM | POA: Diagnosis not present

## 2021-12-26 DIAGNOSIS — R32 Unspecified urinary incontinence: Secondary | ICD-10-CM | POA: Diagnosis not present

## 2021-12-26 DIAGNOSIS — R159 Full incontinence of feces: Secondary | ICD-10-CM | POA: Diagnosis not present

## 2021-12-26 DIAGNOSIS — N189 Chronic kidney disease, unspecified: Secondary | ICD-10-CM | POA: Diagnosis not present

## 2021-12-26 DIAGNOSIS — I129 Hypertensive chronic kidney disease with stage 1 through stage 4 chronic kidney disease, or unspecified chronic kidney disease: Secondary | ICD-10-CM | POA: Diagnosis not present

## 2021-12-26 DIAGNOSIS — M3500 Sicca syndrome, unspecified: Secondary | ICD-10-CM | POA: Diagnosis not present

## 2021-12-27 DIAGNOSIS — I4891 Unspecified atrial fibrillation: Secondary | ICD-10-CM | POA: Diagnosis not present

## 2021-12-27 DIAGNOSIS — R1310 Dysphagia, unspecified: Secondary | ICD-10-CM | POA: Diagnosis not present

## 2021-12-27 DIAGNOSIS — K219 Gastro-esophageal reflux disease without esophagitis: Secondary | ICD-10-CM | POA: Diagnosis not present

## 2021-12-27 DIAGNOSIS — I129 Hypertensive chronic kidney disease with stage 1 through stage 4 chronic kidney disease, or unspecified chronic kidney disease: Secondary | ICD-10-CM | POA: Diagnosis not present

## 2021-12-27 DIAGNOSIS — K222 Esophageal obstruction: Secondary | ICD-10-CM | POA: Diagnosis not present

## 2021-12-27 DIAGNOSIS — N189 Chronic kidney disease, unspecified: Secondary | ICD-10-CM | POA: Diagnosis not present

## 2021-12-31 DIAGNOSIS — K222 Esophageal obstruction: Secondary | ICD-10-CM | POA: Diagnosis not present

## 2021-12-31 DIAGNOSIS — N189 Chronic kidney disease, unspecified: Secondary | ICD-10-CM | POA: Diagnosis not present

## 2021-12-31 DIAGNOSIS — I129 Hypertensive chronic kidney disease with stage 1 through stage 4 chronic kidney disease, or unspecified chronic kidney disease: Secondary | ICD-10-CM | POA: Diagnosis not present

## 2021-12-31 DIAGNOSIS — R1310 Dysphagia, unspecified: Secondary | ICD-10-CM | POA: Diagnosis not present

## 2021-12-31 DIAGNOSIS — I4891 Unspecified atrial fibrillation: Secondary | ICD-10-CM | POA: Diagnosis not present

## 2021-12-31 DIAGNOSIS — K219 Gastro-esophageal reflux disease without esophagitis: Secondary | ICD-10-CM | POA: Diagnosis not present

## 2022-01-02 DIAGNOSIS — N189 Chronic kidney disease, unspecified: Secondary | ICD-10-CM | POA: Diagnosis not present

## 2022-01-02 DIAGNOSIS — I4891 Unspecified atrial fibrillation: Secondary | ICD-10-CM | POA: Diagnosis not present

## 2022-01-02 DIAGNOSIS — I129 Hypertensive chronic kidney disease with stage 1 through stage 4 chronic kidney disease, or unspecified chronic kidney disease: Secondary | ICD-10-CM | POA: Diagnosis not present

## 2022-01-02 DIAGNOSIS — K219 Gastro-esophageal reflux disease without esophagitis: Secondary | ICD-10-CM | POA: Diagnosis not present

## 2022-01-02 DIAGNOSIS — R1310 Dysphagia, unspecified: Secondary | ICD-10-CM | POA: Diagnosis not present

## 2022-01-02 DIAGNOSIS — K222 Esophageal obstruction: Secondary | ICD-10-CM | POA: Diagnosis not present

## 2022-01-03 DIAGNOSIS — I129 Hypertensive chronic kidney disease with stage 1 through stage 4 chronic kidney disease, or unspecified chronic kidney disease: Secondary | ICD-10-CM | POA: Diagnosis not present

## 2022-01-03 DIAGNOSIS — K219 Gastro-esophageal reflux disease without esophagitis: Secondary | ICD-10-CM | POA: Diagnosis not present

## 2022-01-03 DIAGNOSIS — R1310 Dysphagia, unspecified: Secondary | ICD-10-CM | POA: Diagnosis not present

## 2022-01-03 DIAGNOSIS — N189 Chronic kidney disease, unspecified: Secondary | ICD-10-CM | POA: Diagnosis not present

## 2022-01-03 DIAGNOSIS — K222 Esophageal obstruction: Secondary | ICD-10-CM | POA: Diagnosis not present

## 2022-01-03 DIAGNOSIS — I4891 Unspecified atrial fibrillation: Secondary | ICD-10-CM | POA: Diagnosis not present

## 2022-01-07 DIAGNOSIS — K219 Gastro-esophageal reflux disease without esophagitis: Secondary | ICD-10-CM | POA: Diagnosis not present

## 2022-01-07 DIAGNOSIS — I4891 Unspecified atrial fibrillation: Secondary | ICD-10-CM | POA: Diagnosis not present

## 2022-01-07 DIAGNOSIS — N189 Chronic kidney disease, unspecified: Secondary | ICD-10-CM | POA: Diagnosis not present

## 2022-01-07 DIAGNOSIS — K222 Esophageal obstruction: Secondary | ICD-10-CM | POA: Diagnosis not present

## 2022-01-07 DIAGNOSIS — I129 Hypertensive chronic kidney disease with stage 1 through stage 4 chronic kidney disease, or unspecified chronic kidney disease: Secondary | ICD-10-CM | POA: Diagnosis not present

## 2022-01-07 DIAGNOSIS — R1310 Dysphagia, unspecified: Secondary | ICD-10-CM | POA: Diagnosis not present

## 2022-01-09 DIAGNOSIS — R1310 Dysphagia, unspecified: Secondary | ICD-10-CM | POA: Diagnosis not present

## 2022-01-09 DIAGNOSIS — K219 Gastro-esophageal reflux disease without esophagitis: Secondary | ICD-10-CM | POA: Diagnosis not present

## 2022-01-09 DIAGNOSIS — K222 Esophageal obstruction: Secondary | ICD-10-CM | POA: Diagnosis not present

## 2022-01-09 DIAGNOSIS — I129 Hypertensive chronic kidney disease with stage 1 through stage 4 chronic kidney disease, or unspecified chronic kidney disease: Secondary | ICD-10-CM | POA: Diagnosis not present

## 2022-01-09 DIAGNOSIS — I4891 Unspecified atrial fibrillation: Secondary | ICD-10-CM | POA: Diagnosis not present

## 2022-01-09 DIAGNOSIS — N189 Chronic kidney disease, unspecified: Secondary | ICD-10-CM | POA: Diagnosis not present

## 2022-01-10 DIAGNOSIS — N189 Chronic kidney disease, unspecified: Secondary | ICD-10-CM | POA: Diagnosis not present

## 2022-01-10 DIAGNOSIS — K219 Gastro-esophageal reflux disease without esophagitis: Secondary | ICD-10-CM | POA: Diagnosis not present

## 2022-01-10 DIAGNOSIS — I4891 Unspecified atrial fibrillation: Secondary | ICD-10-CM | POA: Diagnosis not present

## 2022-01-10 DIAGNOSIS — R1310 Dysphagia, unspecified: Secondary | ICD-10-CM | POA: Diagnosis not present

## 2022-01-10 DIAGNOSIS — I129 Hypertensive chronic kidney disease with stage 1 through stage 4 chronic kidney disease, or unspecified chronic kidney disease: Secondary | ICD-10-CM | POA: Diagnosis not present

## 2022-01-10 DIAGNOSIS — K222 Esophageal obstruction: Secondary | ICD-10-CM | POA: Diagnosis not present

## 2022-01-14 DIAGNOSIS — R1310 Dysphagia, unspecified: Secondary | ICD-10-CM | POA: Diagnosis not present

## 2022-01-14 DIAGNOSIS — I129 Hypertensive chronic kidney disease with stage 1 through stage 4 chronic kidney disease, or unspecified chronic kidney disease: Secondary | ICD-10-CM | POA: Diagnosis not present

## 2022-01-14 DIAGNOSIS — I4891 Unspecified atrial fibrillation: Secondary | ICD-10-CM | POA: Diagnosis not present

## 2022-01-14 DIAGNOSIS — K222 Esophageal obstruction: Secondary | ICD-10-CM | POA: Diagnosis not present

## 2022-01-14 DIAGNOSIS — N189 Chronic kidney disease, unspecified: Secondary | ICD-10-CM | POA: Diagnosis not present

## 2022-01-14 DIAGNOSIS — K219 Gastro-esophageal reflux disease without esophagitis: Secondary | ICD-10-CM | POA: Diagnosis not present

## 2022-01-16 DIAGNOSIS — I4891 Unspecified atrial fibrillation: Secondary | ICD-10-CM | POA: Diagnosis not present

## 2022-01-16 DIAGNOSIS — N189 Chronic kidney disease, unspecified: Secondary | ICD-10-CM | POA: Diagnosis not present

## 2022-01-16 DIAGNOSIS — I129 Hypertensive chronic kidney disease with stage 1 through stage 4 chronic kidney disease, or unspecified chronic kidney disease: Secondary | ICD-10-CM | POA: Diagnosis not present

## 2022-01-16 DIAGNOSIS — K219 Gastro-esophageal reflux disease without esophagitis: Secondary | ICD-10-CM | POA: Diagnosis not present

## 2022-01-16 DIAGNOSIS — R1310 Dysphagia, unspecified: Secondary | ICD-10-CM | POA: Diagnosis not present

## 2022-01-16 DIAGNOSIS — K222 Esophageal obstruction: Secondary | ICD-10-CM | POA: Diagnosis not present

## 2022-01-17 DIAGNOSIS — N189 Chronic kidney disease, unspecified: Secondary | ICD-10-CM | POA: Diagnosis not present

## 2022-01-17 DIAGNOSIS — R1310 Dysphagia, unspecified: Secondary | ICD-10-CM | POA: Diagnosis not present

## 2022-01-17 DIAGNOSIS — K222 Esophageal obstruction: Secondary | ICD-10-CM | POA: Diagnosis not present

## 2022-01-17 DIAGNOSIS — I129 Hypertensive chronic kidney disease with stage 1 through stage 4 chronic kidney disease, or unspecified chronic kidney disease: Secondary | ICD-10-CM | POA: Diagnosis not present

## 2022-01-17 DIAGNOSIS — K219 Gastro-esophageal reflux disease without esophagitis: Secondary | ICD-10-CM | POA: Diagnosis not present

## 2022-01-17 DIAGNOSIS — I4891 Unspecified atrial fibrillation: Secondary | ICD-10-CM | POA: Diagnosis not present

## 2022-01-21 DIAGNOSIS — R1310 Dysphagia, unspecified: Secondary | ICD-10-CM | POA: Diagnosis not present

## 2022-01-21 DIAGNOSIS — K222 Esophageal obstruction: Secondary | ICD-10-CM | POA: Diagnosis not present

## 2022-01-21 DIAGNOSIS — N189 Chronic kidney disease, unspecified: Secondary | ICD-10-CM | POA: Diagnosis not present

## 2022-01-21 DIAGNOSIS — K219 Gastro-esophageal reflux disease without esophagitis: Secondary | ICD-10-CM | POA: Diagnosis not present

## 2022-01-21 DIAGNOSIS — I129 Hypertensive chronic kidney disease with stage 1 through stage 4 chronic kidney disease, or unspecified chronic kidney disease: Secondary | ICD-10-CM | POA: Diagnosis not present

## 2022-01-21 DIAGNOSIS — I4891 Unspecified atrial fibrillation: Secondary | ICD-10-CM | POA: Diagnosis not present

## 2022-01-23 DIAGNOSIS — I129 Hypertensive chronic kidney disease with stage 1 through stage 4 chronic kidney disease, or unspecified chronic kidney disease: Secondary | ICD-10-CM | POA: Diagnosis not present

## 2022-01-23 DIAGNOSIS — N189 Chronic kidney disease, unspecified: Secondary | ICD-10-CM | POA: Diagnosis not present

## 2022-01-23 DIAGNOSIS — I4891 Unspecified atrial fibrillation: Secondary | ICD-10-CM | POA: Diagnosis not present

## 2022-01-23 DIAGNOSIS — K222 Esophageal obstruction: Secondary | ICD-10-CM | POA: Diagnosis not present

## 2022-01-23 DIAGNOSIS — K219 Gastro-esophageal reflux disease without esophagitis: Secondary | ICD-10-CM | POA: Diagnosis not present

## 2022-01-23 DIAGNOSIS — R1310 Dysphagia, unspecified: Secondary | ICD-10-CM | POA: Diagnosis not present

## 2022-01-24 DIAGNOSIS — K219 Gastro-esophageal reflux disease without esophagitis: Secondary | ICD-10-CM | POA: Diagnosis not present

## 2022-01-24 DIAGNOSIS — R1310 Dysphagia, unspecified: Secondary | ICD-10-CM | POA: Diagnosis not present

## 2022-01-24 DIAGNOSIS — K222 Esophageal obstruction: Secondary | ICD-10-CM | POA: Diagnosis not present

## 2022-01-24 DIAGNOSIS — I4891 Unspecified atrial fibrillation: Secondary | ICD-10-CM | POA: Diagnosis not present

## 2022-01-24 DIAGNOSIS — N189 Chronic kidney disease, unspecified: Secondary | ICD-10-CM | POA: Diagnosis not present

## 2022-01-24 DIAGNOSIS — I129 Hypertensive chronic kidney disease with stage 1 through stage 4 chronic kidney disease, or unspecified chronic kidney disease: Secondary | ICD-10-CM | POA: Diagnosis not present

## 2022-01-26 DIAGNOSIS — K219 Gastro-esophageal reflux disease without esophagitis: Secondary | ICD-10-CM | POA: Diagnosis not present

## 2022-01-26 DIAGNOSIS — F0392 Unspecified dementia, unspecified severity, with psychotic disturbance: Secondary | ICD-10-CM | POA: Diagnosis not present

## 2022-01-26 DIAGNOSIS — F0393 Unspecified dementia, unspecified severity, with mood disturbance: Secondary | ICD-10-CM | POA: Diagnosis not present

## 2022-01-26 DIAGNOSIS — R1310 Dysphagia, unspecified: Secondary | ICD-10-CM | POA: Diagnosis not present

## 2022-01-26 DIAGNOSIS — M3500 Sicca syndrome, unspecified: Secondary | ICD-10-CM | POA: Diagnosis not present

## 2022-01-26 DIAGNOSIS — R159 Full incontinence of feces: Secondary | ICD-10-CM | POA: Diagnosis not present

## 2022-01-26 DIAGNOSIS — I129 Hypertensive chronic kidney disease with stage 1 through stage 4 chronic kidney disease, or unspecified chronic kidney disease: Secondary | ICD-10-CM | POA: Diagnosis not present

## 2022-01-26 DIAGNOSIS — G2581 Restless legs syndrome: Secondary | ICD-10-CM | POA: Diagnosis not present

## 2022-01-26 DIAGNOSIS — I4891 Unspecified atrial fibrillation: Secondary | ICD-10-CM | POA: Diagnosis not present

## 2022-01-26 DIAGNOSIS — N189 Chronic kidney disease, unspecified: Secondary | ICD-10-CM | POA: Diagnosis not present

## 2022-01-26 DIAGNOSIS — L89152 Pressure ulcer of sacral region, stage 2: Secondary | ICD-10-CM | POA: Diagnosis not present

## 2022-01-26 DIAGNOSIS — Z6820 Body mass index (BMI) 20.0-20.9, adult: Secondary | ICD-10-CM | POA: Diagnosis not present

## 2022-01-26 DIAGNOSIS — R32 Unspecified urinary incontinence: Secondary | ICD-10-CM | POA: Diagnosis not present

## 2022-01-26 DIAGNOSIS — K222 Esophageal obstruction: Secondary | ICD-10-CM | POA: Diagnosis not present

## 2022-01-28 DIAGNOSIS — R1310 Dysphagia, unspecified: Secondary | ICD-10-CM | POA: Diagnosis not present

## 2022-01-28 DIAGNOSIS — I129 Hypertensive chronic kidney disease with stage 1 through stage 4 chronic kidney disease, or unspecified chronic kidney disease: Secondary | ICD-10-CM | POA: Diagnosis not present

## 2022-01-28 DIAGNOSIS — K219 Gastro-esophageal reflux disease without esophagitis: Secondary | ICD-10-CM | POA: Diagnosis not present

## 2022-01-28 DIAGNOSIS — I4891 Unspecified atrial fibrillation: Secondary | ICD-10-CM | POA: Diagnosis not present

## 2022-01-28 DIAGNOSIS — N189 Chronic kidney disease, unspecified: Secondary | ICD-10-CM | POA: Diagnosis not present

## 2022-01-28 DIAGNOSIS — K222 Esophageal obstruction: Secondary | ICD-10-CM | POA: Diagnosis not present

## 2022-01-29 DIAGNOSIS — K222 Esophageal obstruction: Secondary | ICD-10-CM | POA: Diagnosis not present

## 2022-01-29 DIAGNOSIS — R1310 Dysphagia, unspecified: Secondary | ICD-10-CM | POA: Diagnosis not present

## 2022-01-29 DIAGNOSIS — I129 Hypertensive chronic kidney disease with stage 1 through stage 4 chronic kidney disease, or unspecified chronic kidney disease: Secondary | ICD-10-CM | POA: Diagnosis not present

## 2022-01-29 DIAGNOSIS — K219 Gastro-esophageal reflux disease without esophagitis: Secondary | ICD-10-CM | POA: Diagnosis not present

## 2022-01-29 DIAGNOSIS — I4891 Unspecified atrial fibrillation: Secondary | ICD-10-CM | POA: Diagnosis not present

## 2022-01-29 DIAGNOSIS — N189 Chronic kidney disease, unspecified: Secondary | ICD-10-CM | POA: Diagnosis not present

## 2022-01-30 DIAGNOSIS — I4891 Unspecified atrial fibrillation: Secondary | ICD-10-CM | POA: Diagnosis not present

## 2022-01-30 DIAGNOSIS — K222 Esophageal obstruction: Secondary | ICD-10-CM | POA: Diagnosis not present

## 2022-01-30 DIAGNOSIS — N189 Chronic kidney disease, unspecified: Secondary | ICD-10-CM | POA: Diagnosis not present

## 2022-01-30 DIAGNOSIS — K219 Gastro-esophageal reflux disease without esophagitis: Secondary | ICD-10-CM | POA: Diagnosis not present

## 2022-01-30 DIAGNOSIS — I129 Hypertensive chronic kidney disease with stage 1 through stage 4 chronic kidney disease, or unspecified chronic kidney disease: Secondary | ICD-10-CM | POA: Diagnosis not present

## 2022-01-30 DIAGNOSIS — R1310 Dysphagia, unspecified: Secondary | ICD-10-CM | POA: Diagnosis not present

## 2022-01-31 DIAGNOSIS — I4891 Unspecified atrial fibrillation: Secondary | ICD-10-CM | POA: Diagnosis not present

## 2022-01-31 DIAGNOSIS — K222 Esophageal obstruction: Secondary | ICD-10-CM | POA: Diagnosis not present

## 2022-01-31 DIAGNOSIS — R1310 Dysphagia, unspecified: Secondary | ICD-10-CM | POA: Diagnosis not present

## 2022-01-31 DIAGNOSIS — K219 Gastro-esophageal reflux disease without esophagitis: Secondary | ICD-10-CM | POA: Diagnosis not present

## 2022-01-31 DIAGNOSIS — I129 Hypertensive chronic kidney disease with stage 1 through stage 4 chronic kidney disease, or unspecified chronic kidney disease: Secondary | ICD-10-CM | POA: Diagnosis not present

## 2022-01-31 DIAGNOSIS — N189 Chronic kidney disease, unspecified: Secondary | ICD-10-CM | POA: Diagnosis not present

## 2022-02-04 DIAGNOSIS — I129 Hypertensive chronic kidney disease with stage 1 through stage 4 chronic kidney disease, or unspecified chronic kidney disease: Secondary | ICD-10-CM | POA: Diagnosis not present

## 2022-02-04 DIAGNOSIS — K222 Esophageal obstruction: Secondary | ICD-10-CM | POA: Diagnosis not present

## 2022-02-04 DIAGNOSIS — R1310 Dysphagia, unspecified: Secondary | ICD-10-CM | POA: Diagnosis not present

## 2022-02-04 DIAGNOSIS — K219 Gastro-esophageal reflux disease without esophagitis: Secondary | ICD-10-CM | POA: Diagnosis not present

## 2022-02-04 DIAGNOSIS — I4891 Unspecified atrial fibrillation: Secondary | ICD-10-CM | POA: Diagnosis not present

## 2022-02-04 DIAGNOSIS — N189 Chronic kidney disease, unspecified: Secondary | ICD-10-CM | POA: Diagnosis not present

## 2022-02-06 DIAGNOSIS — I4891 Unspecified atrial fibrillation: Secondary | ICD-10-CM | POA: Diagnosis not present

## 2022-02-06 DIAGNOSIS — I129 Hypertensive chronic kidney disease with stage 1 through stage 4 chronic kidney disease, or unspecified chronic kidney disease: Secondary | ICD-10-CM | POA: Diagnosis not present

## 2022-02-06 DIAGNOSIS — R1310 Dysphagia, unspecified: Secondary | ICD-10-CM | POA: Diagnosis not present

## 2022-02-06 DIAGNOSIS — K222 Esophageal obstruction: Secondary | ICD-10-CM | POA: Diagnosis not present

## 2022-02-06 DIAGNOSIS — N189 Chronic kidney disease, unspecified: Secondary | ICD-10-CM | POA: Diagnosis not present

## 2022-02-06 DIAGNOSIS — K219 Gastro-esophageal reflux disease without esophagitis: Secondary | ICD-10-CM | POA: Diagnosis not present

## 2022-02-07 DIAGNOSIS — R1310 Dysphagia, unspecified: Secondary | ICD-10-CM | POA: Diagnosis not present

## 2022-02-07 DIAGNOSIS — I129 Hypertensive chronic kidney disease with stage 1 through stage 4 chronic kidney disease, or unspecified chronic kidney disease: Secondary | ICD-10-CM | POA: Diagnosis not present

## 2022-02-07 DIAGNOSIS — K219 Gastro-esophageal reflux disease without esophagitis: Secondary | ICD-10-CM | POA: Diagnosis not present

## 2022-02-07 DIAGNOSIS — N189 Chronic kidney disease, unspecified: Secondary | ICD-10-CM | POA: Diagnosis not present

## 2022-02-07 DIAGNOSIS — K222 Esophageal obstruction: Secondary | ICD-10-CM | POA: Diagnosis not present

## 2022-02-07 DIAGNOSIS — I4891 Unspecified atrial fibrillation: Secondary | ICD-10-CM | POA: Diagnosis not present

## 2022-02-11 DIAGNOSIS — K219 Gastro-esophageal reflux disease without esophagitis: Secondary | ICD-10-CM | POA: Diagnosis not present

## 2022-02-11 DIAGNOSIS — R1310 Dysphagia, unspecified: Secondary | ICD-10-CM | POA: Diagnosis not present

## 2022-02-11 DIAGNOSIS — I4891 Unspecified atrial fibrillation: Secondary | ICD-10-CM | POA: Diagnosis not present

## 2022-02-11 DIAGNOSIS — I129 Hypertensive chronic kidney disease with stage 1 through stage 4 chronic kidney disease, or unspecified chronic kidney disease: Secondary | ICD-10-CM | POA: Diagnosis not present

## 2022-02-11 DIAGNOSIS — N189 Chronic kidney disease, unspecified: Secondary | ICD-10-CM | POA: Diagnosis not present

## 2022-02-11 DIAGNOSIS — K222 Esophageal obstruction: Secondary | ICD-10-CM | POA: Diagnosis not present

## 2022-02-13 DIAGNOSIS — K219 Gastro-esophageal reflux disease without esophagitis: Secondary | ICD-10-CM | POA: Diagnosis not present

## 2022-02-13 DIAGNOSIS — I129 Hypertensive chronic kidney disease with stage 1 through stage 4 chronic kidney disease, or unspecified chronic kidney disease: Secondary | ICD-10-CM | POA: Diagnosis not present

## 2022-02-13 DIAGNOSIS — I4891 Unspecified atrial fibrillation: Secondary | ICD-10-CM | POA: Diagnosis not present

## 2022-02-13 DIAGNOSIS — N189 Chronic kidney disease, unspecified: Secondary | ICD-10-CM | POA: Diagnosis not present

## 2022-02-13 DIAGNOSIS — R1310 Dysphagia, unspecified: Secondary | ICD-10-CM | POA: Diagnosis not present

## 2022-02-13 DIAGNOSIS — K222 Esophageal obstruction: Secondary | ICD-10-CM | POA: Diagnosis not present

## 2022-02-14 DIAGNOSIS — N189 Chronic kidney disease, unspecified: Secondary | ICD-10-CM | POA: Diagnosis not present

## 2022-02-14 DIAGNOSIS — I129 Hypertensive chronic kidney disease with stage 1 through stage 4 chronic kidney disease, or unspecified chronic kidney disease: Secondary | ICD-10-CM | POA: Diagnosis not present

## 2022-02-14 DIAGNOSIS — K219 Gastro-esophageal reflux disease without esophagitis: Secondary | ICD-10-CM | POA: Diagnosis not present

## 2022-02-14 DIAGNOSIS — I4891 Unspecified atrial fibrillation: Secondary | ICD-10-CM | POA: Diagnosis not present

## 2022-02-14 DIAGNOSIS — R1310 Dysphagia, unspecified: Secondary | ICD-10-CM | POA: Diagnosis not present

## 2022-02-14 DIAGNOSIS — K222 Esophageal obstruction: Secondary | ICD-10-CM | POA: Diagnosis not present

## 2022-02-18 DIAGNOSIS — N189 Chronic kidney disease, unspecified: Secondary | ICD-10-CM | POA: Diagnosis not present

## 2022-02-18 DIAGNOSIS — R1310 Dysphagia, unspecified: Secondary | ICD-10-CM | POA: Diagnosis not present

## 2022-02-18 DIAGNOSIS — I129 Hypertensive chronic kidney disease with stage 1 through stage 4 chronic kidney disease, or unspecified chronic kidney disease: Secondary | ICD-10-CM | POA: Diagnosis not present

## 2022-02-18 DIAGNOSIS — K222 Esophageal obstruction: Secondary | ICD-10-CM | POA: Diagnosis not present

## 2022-02-18 DIAGNOSIS — K219 Gastro-esophageal reflux disease without esophagitis: Secondary | ICD-10-CM | POA: Diagnosis not present

## 2022-02-18 DIAGNOSIS — I4891 Unspecified atrial fibrillation: Secondary | ICD-10-CM | POA: Diagnosis not present

## 2022-02-20 DIAGNOSIS — N189 Chronic kidney disease, unspecified: Secondary | ICD-10-CM | POA: Diagnosis not present

## 2022-02-20 DIAGNOSIS — K222 Esophageal obstruction: Secondary | ICD-10-CM | POA: Diagnosis not present

## 2022-02-20 DIAGNOSIS — K219 Gastro-esophageal reflux disease without esophagitis: Secondary | ICD-10-CM | POA: Diagnosis not present

## 2022-02-20 DIAGNOSIS — R1310 Dysphagia, unspecified: Secondary | ICD-10-CM | POA: Diagnosis not present

## 2022-02-20 DIAGNOSIS — I129 Hypertensive chronic kidney disease with stage 1 through stage 4 chronic kidney disease, or unspecified chronic kidney disease: Secondary | ICD-10-CM | POA: Diagnosis not present

## 2022-02-20 DIAGNOSIS — I4891 Unspecified atrial fibrillation: Secondary | ICD-10-CM | POA: Diagnosis not present

## 2022-02-21 DIAGNOSIS — I4891 Unspecified atrial fibrillation: Secondary | ICD-10-CM | POA: Diagnosis not present

## 2022-02-21 DIAGNOSIS — N189 Chronic kidney disease, unspecified: Secondary | ICD-10-CM | POA: Diagnosis not present

## 2022-02-21 DIAGNOSIS — K222 Esophageal obstruction: Secondary | ICD-10-CM | POA: Diagnosis not present

## 2022-02-21 DIAGNOSIS — R1310 Dysphagia, unspecified: Secondary | ICD-10-CM | POA: Diagnosis not present

## 2022-02-21 DIAGNOSIS — I129 Hypertensive chronic kidney disease with stage 1 through stage 4 chronic kidney disease, or unspecified chronic kidney disease: Secondary | ICD-10-CM | POA: Diagnosis not present

## 2022-02-21 DIAGNOSIS — K219 Gastro-esophageal reflux disease without esophagitis: Secondary | ICD-10-CM | POA: Diagnosis not present

## 2022-02-25 DIAGNOSIS — I4891 Unspecified atrial fibrillation: Secondary | ICD-10-CM | POA: Diagnosis not present

## 2022-02-25 DIAGNOSIS — R32 Unspecified urinary incontinence: Secondary | ICD-10-CM | POA: Diagnosis not present

## 2022-02-25 DIAGNOSIS — Z6820 Body mass index (BMI) 20.0-20.9, adult: Secondary | ICD-10-CM | POA: Diagnosis not present

## 2022-02-25 DIAGNOSIS — I129 Hypertensive chronic kidney disease with stage 1 through stage 4 chronic kidney disease, or unspecified chronic kidney disease: Secondary | ICD-10-CM | POA: Diagnosis not present

## 2022-02-25 DIAGNOSIS — R159 Full incontinence of feces: Secondary | ICD-10-CM | POA: Diagnosis not present

## 2022-02-25 DIAGNOSIS — F0393 Unspecified dementia, unspecified severity, with mood disturbance: Secondary | ICD-10-CM | POA: Diagnosis not present

## 2022-02-25 DIAGNOSIS — F0392 Unspecified dementia, unspecified severity, with psychotic disturbance: Secondary | ICD-10-CM | POA: Diagnosis not present

## 2022-02-25 DIAGNOSIS — K219 Gastro-esophageal reflux disease without esophagitis: Secondary | ICD-10-CM | POA: Diagnosis not present

## 2022-02-25 DIAGNOSIS — G2581 Restless legs syndrome: Secondary | ICD-10-CM | POA: Diagnosis not present

## 2022-02-25 DIAGNOSIS — M3500 Sicca syndrome, unspecified: Secondary | ICD-10-CM | POA: Diagnosis not present

## 2022-02-25 DIAGNOSIS — K222 Esophageal obstruction: Secondary | ICD-10-CM | POA: Diagnosis not present

## 2022-02-25 DIAGNOSIS — R1310 Dysphagia, unspecified: Secondary | ICD-10-CM | POA: Diagnosis not present

## 2022-02-25 DIAGNOSIS — L89152 Pressure ulcer of sacral region, stage 2: Secondary | ICD-10-CM | POA: Diagnosis not present

## 2022-02-25 DIAGNOSIS — N189 Chronic kidney disease, unspecified: Secondary | ICD-10-CM | POA: Diagnosis not present

## 2022-02-27 DIAGNOSIS — R1310 Dysphagia, unspecified: Secondary | ICD-10-CM | POA: Diagnosis not present

## 2022-02-27 DIAGNOSIS — K219 Gastro-esophageal reflux disease without esophagitis: Secondary | ICD-10-CM | POA: Diagnosis not present

## 2022-02-27 DIAGNOSIS — I129 Hypertensive chronic kidney disease with stage 1 through stage 4 chronic kidney disease, or unspecified chronic kidney disease: Secondary | ICD-10-CM | POA: Diagnosis not present

## 2022-02-27 DIAGNOSIS — I4891 Unspecified atrial fibrillation: Secondary | ICD-10-CM | POA: Diagnosis not present

## 2022-02-27 DIAGNOSIS — N189 Chronic kidney disease, unspecified: Secondary | ICD-10-CM | POA: Diagnosis not present

## 2022-02-27 DIAGNOSIS — K222 Esophageal obstruction: Secondary | ICD-10-CM | POA: Diagnosis not present

## 2022-02-28 DIAGNOSIS — N189 Chronic kidney disease, unspecified: Secondary | ICD-10-CM | POA: Diagnosis not present

## 2022-02-28 DIAGNOSIS — K219 Gastro-esophageal reflux disease without esophagitis: Secondary | ICD-10-CM | POA: Diagnosis not present

## 2022-02-28 DIAGNOSIS — R1310 Dysphagia, unspecified: Secondary | ICD-10-CM | POA: Diagnosis not present

## 2022-02-28 DIAGNOSIS — K222 Esophageal obstruction: Secondary | ICD-10-CM | POA: Diagnosis not present

## 2022-02-28 DIAGNOSIS — I129 Hypertensive chronic kidney disease with stage 1 through stage 4 chronic kidney disease, or unspecified chronic kidney disease: Secondary | ICD-10-CM | POA: Diagnosis not present

## 2022-02-28 DIAGNOSIS — I4891 Unspecified atrial fibrillation: Secondary | ICD-10-CM | POA: Diagnosis not present

## 2022-03-04 DIAGNOSIS — I4891 Unspecified atrial fibrillation: Secondary | ICD-10-CM | POA: Diagnosis not present

## 2022-03-04 DIAGNOSIS — K222 Esophageal obstruction: Secondary | ICD-10-CM | POA: Diagnosis not present

## 2022-03-04 DIAGNOSIS — R1310 Dysphagia, unspecified: Secondary | ICD-10-CM | POA: Diagnosis not present

## 2022-03-04 DIAGNOSIS — N189 Chronic kidney disease, unspecified: Secondary | ICD-10-CM | POA: Diagnosis not present

## 2022-03-04 DIAGNOSIS — I129 Hypertensive chronic kidney disease with stage 1 through stage 4 chronic kidney disease, or unspecified chronic kidney disease: Secondary | ICD-10-CM | POA: Diagnosis not present

## 2022-03-04 DIAGNOSIS — K219 Gastro-esophageal reflux disease without esophagitis: Secondary | ICD-10-CM | POA: Diagnosis not present

## 2022-03-06 DIAGNOSIS — I4891 Unspecified atrial fibrillation: Secondary | ICD-10-CM | POA: Diagnosis not present

## 2022-03-06 DIAGNOSIS — I129 Hypertensive chronic kidney disease with stage 1 through stage 4 chronic kidney disease, or unspecified chronic kidney disease: Secondary | ICD-10-CM | POA: Diagnosis not present

## 2022-03-06 DIAGNOSIS — K219 Gastro-esophageal reflux disease without esophagitis: Secondary | ICD-10-CM | POA: Diagnosis not present

## 2022-03-06 DIAGNOSIS — R1310 Dysphagia, unspecified: Secondary | ICD-10-CM | POA: Diagnosis not present

## 2022-03-06 DIAGNOSIS — K222 Esophageal obstruction: Secondary | ICD-10-CM | POA: Diagnosis not present

## 2022-03-06 DIAGNOSIS — N189 Chronic kidney disease, unspecified: Secondary | ICD-10-CM | POA: Diagnosis not present

## 2022-03-07 DIAGNOSIS — I4891 Unspecified atrial fibrillation: Secondary | ICD-10-CM | POA: Diagnosis not present

## 2022-03-07 DIAGNOSIS — K222 Esophageal obstruction: Secondary | ICD-10-CM | POA: Diagnosis not present

## 2022-03-07 DIAGNOSIS — K219 Gastro-esophageal reflux disease without esophagitis: Secondary | ICD-10-CM | POA: Diagnosis not present

## 2022-03-07 DIAGNOSIS — N189 Chronic kidney disease, unspecified: Secondary | ICD-10-CM | POA: Diagnosis not present

## 2022-03-07 DIAGNOSIS — R1310 Dysphagia, unspecified: Secondary | ICD-10-CM | POA: Diagnosis not present

## 2022-03-07 DIAGNOSIS — I129 Hypertensive chronic kidney disease with stage 1 through stage 4 chronic kidney disease, or unspecified chronic kidney disease: Secondary | ICD-10-CM | POA: Diagnosis not present

## 2022-03-11 DIAGNOSIS — N189 Chronic kidney disease, unspecified: Secondary | ICD-10-CM | POA: Diagnosis not present

## 2022-03-11 DIAGNOSIS — I129 Hypertensive chronic kidney disease with stage 1 through stage 4 chronic kidney disease, or unspecified chronic kidney disease: Secondary | ICD-10-CM | POA: Diagnosis not present

## 2022-03-11 DIAGNOSIS — I4891 Unspecified atrial fibrillation: Secondary | ICD-10-CM | POA: Diagnosis not present

## 2022-03-11 DIAGNOSIS — K222 Esophageal obstruction: Secondary | ICD-10-CM | POA: Diagnosis not present

## 2022-03-11 DIAGNOSIS — K219 Gastro-esophageal reflux disease without esophagitis: Secondary | ICD-10-CM | POA: Diagnosis not present

## 2022-03-11 DIAGNOSIS — R1310 Dysphagia, unspecified: Secondary | ICD-10-CM | POA: Diagnosis not present

## 2022-03-13 DIAGNOSIS — N189 Chronic kidney disease, unspecified: Secondary | ICD-10-CM | POA: Diagnosis not present

## 2022-03-13 DIAGNOSIS — I129 Hypertensive chronic kidney disease with stage 1 through stage 4 chronic kidney disease, or unspecified chronic kidney disease: Secondary | ICD-10-CM | POA: Diagnosis not present

## 2022-03-13 DIAGNOSIS — R1310 Dysphagia, unspecified: Secondary | ICD-10-CM | POA: Diagnosis not present

## 2022-03-13 DIAGNOSIS — K219 Gastro-esophageal reflux disease without esophagitis: Secondary | ICD-10-CM | POA: Diagnosis not present

## 2022-03-13 DIAGNOSIS — K222 Esophageal obstruction: Secondary | ICD-10-CM | POA: Diagnosis not present

## 2022-03-13 DIAGNOSIS — I4891 Unspecified atrial fibrillation: Secondary | ICD-10-CM | POA: Diagnosis not present

## 2022-03-14 DIAGNOSIS — I4891 Unspecified atrial fibrillation: Secondary | ICD-10-CM | POA: Diagnosis not present

## 2022-03-14 DIAGNOSIS — K219 Gastro-esophageal reflux disease without esophagitis: Secondary | ICD-10-CM | POA: Diagnosis not present

## 2022-03-14 DIAGNOSIS — K222 Esophageal obstruction: Secondary | ICD-10-CM | POA: Diagnosis not present

## 2022-03-14 DIAGNOSIS — R1310 Dysphagia, unspecified: Secondary | ICD-10-CM | POA: Diagnosis not present

## 2022-03-14 DIAGNOSIS — N189 Chronic kidney disease, unspecified: Secondary | ICD-10-CM | POA: Diagnosis not present

## 2022-03-14 DIAGNOSIS — I129 Hypertensive chronic kidney disease with stage 1 through stage 4 chronic kidney disease, or unspecified chronic kidney disease: Secondary | ICD-10-CM | POA: Diagnosis not present

## 2022-03-18 DIAGNOSIS — I4891 Unspecified atrial fibrillation: Secondary | ICD-10-CM | POA: Diagnosis not present

## 2022-03-18 DIAGNOSIS — K219 Gastro-esophageal reflux disease without esophagitis: Secondary | ICD-10-CM | POA: Diagnosis not present

## 2022-03-18 DIAGNOSIS — K222 Esophageal obstruction: Secondary | ICD-10-CM | POA: Diagnosis not present

## 2022-03-18 DIAGNOSIS — N189 Chronic kidney disease, unspecified: Secondary | ICD-10-CM | POA: Diagnosis not present

## 2022-03-18 DIAGNOSIS — I129 Hypertensive chronic kidney disease with stage 1 through stage 4 chronic kidney disease, or unspecified chronic kidney disease: Secondary | ICD-10-CM | POA: Diagnosis not present

## 2022-03-18 DIAGNOSIS — R1310 Dysphagia, unspecified: Secondary | ICD-10-CM | POA: Diagnosis not present

## 2022-03-20 DIAGNOSIS — K219 Gastro-esophageal reflux disease without esophagitis: Secondary | ICD-10-CM | POA: Diagnosis not present

## 2022-03-20 DIAGNOSIS — I129 Hypertensive chronic kidney disease with stage 1 through stage 4 chronic kidney disease, or unspecified chronic kidney disease: Secondary | ICD-10-CM | POA: Diagnosis not present

## 2022-03-20 DIAGNOSIS — M2041 Other hammer toe(s) (acquired), right foot: Secondary | ICD-10-CM | POA: Diagnosis not present

## 2022-03-20 DIAGNOSIS — B351 Tinea unguium: Secondary | ICD-10-CM | POA: Diagnosis not present

## 2022-03-20 DIAGNOSIS — L84 Corns and callosities: Secondary | ICD-10-CM | POA: Diagnosis not present

## 2022-03-20 DIAGNOSIS — I4891 Unspecified atrial fibrillation: Secondary | ICD-10-CM | POA: Diagnosis not present

## 2022-03-20 DIAGNOSIS — N189 Chronic kidney disease, unspecified: Secondary | ICD-10-CM | POA: Diagnosis not present

## 2022-03-20 DIAGNOSIS — K222 Esophageal obstruction: Secondary | ICD-10-CM | POA: Diagnosis not present

## 2022-03-20 DIAGNOSIS — R1310 Dysphagia, unspecified: Secondary | ICD-10-CM | POA: Diagnosis not present

## 2022-03-21 DIAGNOSIS — N189 Chronic kidney disease, unspecified: Secondary | ICD-10-CM | POA: Diagnosis not present

## 2022-03-21 DIAGNOSIS — K222 Esophageal obstruction: Secondary | ICD-10-CM | POA: Diagnosis not present

## 2022-03-21 DIAGNOSIS — K219 Gastro-esophageal reflux disease without esophagitis: Secondary | ICD-10-CM | POA: Diagnosis not present

## 2022-03-21 DIAGNOSIS — R1310 Dysphagia, unspecified: Secondary | ICD-10-CM | POA: Diagnosis not present

## 2022-03-21 DIAGNOSIS — I4891 Unspecified atrial fibrillation: Secondary | ICD-10-CM | POA: Diagnosis not present

## 2022-03-21 DIAGNOSIS — I129 Hypertensive chronic kidney disease with stage 1 through stage 4 chronic kidney disease, or unspecified chronic kidney disease: Secondary | ICD-10-CM | POA: Diagnosis not present

## 2022-03-25 DIAGNOSIS — K222 Esophageal obstruction: Secondary | ICD-10-CM | POA: Diagnosis not present

## 2022-03-25 DIAGNOSIS — K219 Gastro-esophageal reflux disease without esophagitis: Secondary | ICD-10-CM | POA: Diagnosis not present

## 2022-03-25 DIAGNOSIS — N189 Chronic kidney disease, unspecified: Secondary | ICD-10-CM | POA: Diagnosis not present

## 2022-03-25 DIAGNOSIS — I4891 Unspecified atrial fibrillation: Secondary | ICD-10-CM | POA: Diagnosis not present

## 2022-03-25 DIAGNOSIS — I129 Hypertensive chronic kidney disease with stage 1 through stage 4 chronic kidney disease, or unspecified chronic kidney disease: Secondary | ICD-10-CM | POA: Diagnosis not present

## 2022-03-25 DIAGNOSIS — R1310 Dysphagia, unspecified: Secondary | ICD-10-CM | POA: Diagnosis not present

## 2022-03-27 DIAGNOSIS — R1310 Dysphagia, unspecified: Secondary | ICD-10-CM | POA: Diagnosis not present

## 2022-03-27 DIAGNOSIS — K219 Gastro-esophageal reflux disease without esophagitis: Secondary | ICD-10-CM | POA: Diagnosis not present

## 2022-03-27 DIAGNOSIS — I129 Hypertensive chronic kidney disease with stage 1 through stage 4 chronic kidney disease, or unspecified chronic kidney disease: Secondary | ICD-10-CM | POA: Diagnosis not present

## 2022-03-27 DIAGNOSIS — K222 Esophageal obstruction: Secondary | ICD-10-CM | POA: Diagnosis not present

## 2022-03-27 DIAGNOSIS — N189 Chronic kidney disease, unspecified: Secondary | ICD-10-CM | POA: Diagnosis not present

## 2022-03-27 DIAGNOSIS — I4891 Unspecified atrial fibrillation: Secondary | ICD-10-CM | POA: Diagnosis not present

## 2022-03-28 DIAGNOSIS — R1310 Dysphagia, unspecified: Secondary | ICD-10-CM | POA: Diagnosis not present

## 2022-03-28 DIAGNOSIS — R159 Full incontinence of feces: Secondary | ICD-10-CM | POA: Diagnosis not present

## 2022-03-28 DIAGNOSIS — I4891 Unspecified atrial fibrillation: Secondary | ICD-10-CM | POA: Diagnosis not present

## 2022-03-28 DIAGNOSIS — L89152 Pressure ulcer of sacral region, stage 2: Secondary | ICD-10-CM | POA: Diagnosis not present

## 2022-03-28 DIAGNOSIS — M3500 Sicca syndrome, unspecified: Secondary | ICD-10-CM | POA: Diagnosis not present

## 2022-03-28 DIAGNOSIS — K222 Esophageal obstruction: Secondary | ICD-10-CM | POA: Diagnosis not present

## 2022-03-28 DIAGNOSIS — I129 Hypertensive chronic kidney disease with stage 1 through stage 4 chronic kidney disease, or unspecified chronic kidney disease: Secondary | ICD-10-CM | POA: Diagnosis not present

## 2022-03-28 DIAGNOSIS — F0392 Unspecified dementia, unspecified severity, with psychotic disturbance: Secondary | ICD-10-CM | POA: Diagnosis not present

## 2022-03-28 DIAGNOSIS — N189 Chronic kidney disease, unspecified: Secondary | ICD-10-CM | POA: Diagnosis not present

## 2022-03-28 DIAGNOSIS — K219 Gastro-esophageal reflux disease without esophagitis: Secondary | ICD-10-CM | POA: Diagnosis not present

## 2022-03-28 DIAGNOSIS — Z6820 Body mass index (BMI) 20.0-20.9, adult: Secondary | ICD-10-CM | POA: Diagnosis not present

## 2022-03-28 DIAGNOSIS — R32 Unspecified urinary incontinence: Secondary | ICD-10-CM | POA: Diagnosis not present

## 2022-03-28 DIAGNOSIS — F0393 Unspecified dementia, unspecified severity, with mood disturbance: Secondary | ICD-10-CM | POA: Diagnosis not present

## 2022-03-28 DIAGNOSIS — G2581 Restless legs syndrome: Secondary | ICD-10-CM | POA: Diagnosis not present

## 2022-04-01 DIAGNOSIS — R1310 Dysphagia, unspecified: Secondary | ICD-10-CM | POA: Diagnosis not present

## 2022-04-01 DIAGNOSIS — I4891 Unspecified atrial fibrillation: Secondary | ICD-10-CM | POA: Diagnosis not present

## 2022-04-01 DIAGNOSIS — K219 Gastro-esophageal reflux disease without esophagitis: Secondary | ICD-10-CM | POA: Diagnosis not present

## 2022-04-01 DIAGNOSIS — N189 Chronic kidney disease, unspecified: Secondary | ICD-10-CM | POA: Diagnosis not present

## 2022-04-01 DIAGNOSIS — K222 Esophageal obstruction: Secondary | ICD-10-CM | POA: Diagnosis not present

## 2022-04-01 DIAGNOSIS — I129 Hypertensive chronic kidney disease with stage 1 through stage 4 chronic kidney disease, or unspecified chronic kidney disease: Secondary | ICD-10-CM | POA: Diagnosis not present

## 2022-04-03 DIAGNOSIS — R1310 Dysphagia, unspecified: Secondary | ICD-10-CM | POA: Diagnosis not present

## 2022-04-03 DIAGNOSIS — I129 Hypertensive chronic kidney disease with stage 1 through stage 4 chronic kidney disease, or unspecified chronic kidney disease: Secondary | ICD-10-CM | POA: Diagnosis not present

## 2022-04-03 DIAGNOSIS — K219 Gastro-esophageal reflux disease without esophagitis: Secondary | ICD-10-CM | POA: Diagnosis not present

## 2022-04-03 DIAGNOSIS — N189 Chronic kidney disease, unspecified: Secondary | ICD-10-CM | POA: Diagnosis not present

## 2022-04-03 DIAGNOSIS — K222 Esophageal obstruction: Secondary | ICD-10-CM | POA: Diagnosis not present

## 2022-04-03 DIAGNOSIS — I4891 Unspecified atrial fibrillation: Secondary | ICD-10-CM | POA: Diagnosis not present

## 2022-04-04 DIAGNOSIS — N189 Chronic kidney disease, unspecified: Secondary | ICD-10-CM | POA: Diagnosis not present

## 2022-04-04 DIAGNOSIS — I4891 Unspecified atrial fibrillation: Secondary | ICD-10-CM | POA: Diagnosis not present

## 2022-04-04 DIAGNOSIS — K222 Esophageal obstruction: Secondary | ICD-10-CM | POA: Diagnosis not present

## 2022-04-04 DIAGNOSIS — I129 Hypertensive chronic kidney disease with stage 1 through stage 4 chronic kidney disease, or unspecified chronic kidney disease: Secondary | ICD-10-CM | POA: Diagnosis not present

## 2022-04-04 DIAGNOSIS — R1310 Dysphagia, unspecified: Secondary | ICD-10-CM | POA: Diagnosis not present

## 2022-04-04 DIAGNOSIS — K219 Gastro-esophageal reflux disease without esophagitis: Secondary | ICD-10-CM | POA: Diagnosis not present

## 2022-04-08 DIAGNOSIS — I4891 Unspecified atrial fibrillation: Secondary | ICD-10-CM | POA: Diagnosis not present

## 2022-04-08 DIAGNOSIS — K219 Gastro-esophageal reflux disease without esophagitis: Secondary | ICD-10-CM | POA: Diagnosis not present

## 2022-04-08 DIAGNOSIS — K222 Esophageal obstruction: Secondary | ICD-10-CM | POA: Diagnosis not present

## 2022-04-08 DIAGNOSIS — N189 Chronic kidney disease, unspecified: Secondary | ICD-10-CM | POA: Diagnosis not present

## 2022-04-08 DIAGNOSIS — I129 Hypertensive chronic kidney disease with stage 1 through stage 4 chronic kidney disease, or unspecified chronic kidney disease: Secondary | ICD-10-CM | POA: Diagnosis not present

## 2022-04-08 DIAGNOSIS — R1310 Dysphagia, unspecified: Secondary | ICD-10-CM | POA: Diagnosis not present

## 2022-04-10 DIAGNOSIS — K222 Esophageal obstruction: Secondary | ICD-10-CM | POA: Diagnosis not present

## 2022-04-10 DIAGNOSIS — R1310 Dysphagia, unspecified: Secondary | ICD-10-CM | POA: Diagnosis not present

## 2022-04-10 DIAGNOSIS — K219 Gastro-esophageal reflux disease without esophagitis: Secondary | ICD-10-CM | POA: Diagnosis not present

## 2022-04-10 DIAGNOSIS — I129 Hypertensive chronic kidney disease with stage 1 through stage 4 chronic kidney disease, or unspecified chronic kidney disease: Secondary | ICD-10-CM | POA: Diagnosis not present

## 2022-04-10 DIAGNOSIS — I4891 Unspecified atrial fibrillation: Secondary | ICD-10-CM | POA: Diagnosis not present

## 2022-04-10 DIAGNOSIS — N189 Chronic kidney disease, unspecified: Secondary | ICD-10-CM | POA: Diagnosis not present

## 2022-04-11 DIAGNOSIS — K219 Gastro-esophageal reflux disease without esophagitis: Secondary | ICD-10-CM | POA: Diagnosis not present

## 2022-04-11 DIAGNOSIS — K222 Esophageal obstruction: Secondary | ICD-10-CM | POA: Diagnosis not present

## 2022-04-11 DIAGNOSIS — N189 Chronic kidney disease, unspecified: Secondary | ICD-10-CM | POA: Diagnosis not present

## 2022-04-11 DIAGNOSIS — R1310 Dysphagia, unspecified: Secondary | ICD-10-CM | POA: Diagnosis not present

## 2022-04-11 DIAGNOSIS — I129 Hypertensive chronic kidney disease with stage 1 through stage 4 chronic kidney disease, or unspecified chronic kidney disease: Secondary | ICD-10-CM | POA: Diagnosis not present

## 2022-04-11 DIAGNOSIS — I4891 Unspecified atrial fibrillation: Secondary | ICD-10-CM | POA: Diagnosis not present

## 2022-04-15 DIAGNOSIS — I129 Hypertensive chronic kidney disease with stage 1 through stage 4 chronic kidney disease, or unspecified chronic kidney disease: Secondary | ICD-10-CM | POA: Diagnosis not present

## 2022-04-15 DIAGNOSIS — K222 Esophageal obstruction: Secondary | ICD-10-CM | POA: Diagnosis not present

## 2022-04-15 DIAGNOSIS — N189 Chronic kidney disease, unspecified: Secondary | ICD-10-CM | POA: Diagnosis not present

## 2022-04-15 DIAGNOSIS — K219 Gastro-esophageal reflux disease without esophagitis: Secondary | ICD-10-CM | POA: Diagnosis not present

## 2022-04-15 DIAGNOSIS — R1310 Dysphagia, unspecified: Secondary | ICD-10-CM | POA: Diagnosis not present

## 2022-04-15 DIAGNOSIS — I4891 Unspecified atrial fibrillation: Secondary | ICD-10-CM | POA: Diagnosis not present

## 2022-04-17 DIAGNOSIS — I4891 Unspecified atrial fibrillation: Secondary | ICD-10-CM | POA: Diagnosis not present

## 2022-04-17 DIAGNOSIS — R1310 Dysphagia, unspecified: Secondary | ICD-10-CM | POA: Diagnosis not present

## 2022-04-17 DIAGNOSIS — I129 Hypertensive chronic kidney disease with stage 1 through stage 4 chronic kidney disease, or unspecified chronic kidney disease: Secondary | ICD-10-CM | POA: Diagnosis not present

## 2022-04-17 DIAGNOSIS — K222 Esophageal obstruction: Secondary | ICD-10-CM | POA: Diagnosis not present

## 2022-04-17 DIAGNOSIS — N189 Chronic kidney disease, unspecified: Secondary | ICD-10-CM | POA: Diagnosis not present

## 2022-04-17 DIAGNOSIS — K219 Gastro-esophageal reflux disease without esophagitis: Secondary | ICD-10-CM | POA: Diagnosis not present

## 2022-04-18 DIAGNOSIS — K222 Esophageal obstruction: Secondary | ICD-10-CM | POA: Diagnosis not present

## 2022-04-18 DIAGNOSIS — N189 Chronic kidney disease, unspecified: Secondary | ICD-10-CM | POA: Diagnosis not present

## 2022-04-18 DIAGNOSIS — K219 Gastro-esophageal reflux disease without esophagitis: Secondary | ICD-10-CM | POA: Diagnosis not present

## 2022-04-18 DIAGNOSIS — R1310 Dysphagia, unspecified: Secondary | ICD-10-CM | POA: Diagnosis not present

## 2022-04-18 DIAGNOSIS — I129 Hypertensive chronic kidney disease with stage 1 through stage 4 chronic kidney disease, or unspecified chronic kidney disease: Secondary | ICD-10-CM | POA: Diagnosis not present

## 2022-04-18 DIAGNOSIS — I4891 Unspecified atrial fibrillation: Secondary | ICD-10-CM | POA: Diagnosis not present

## 2022-04-22 DIAGNOSIS — I129 Hypertensive chronic kidney disease with stage 1 through stage 4 chronic kidney disease, or unspecified chronic kidney disease: Secondary | ICD-10-CM | POA: Diagnosis not present

## 2022-04-22 DIAGNOSIS — N189 Chronic kidney disease, unspecified: Secondary | ICD-10-CM | POA: Diagnosis not present

## 2022-04-22 DIAGNOSIS — K222 Esophageal obstruction: Secondary | ICD-10-CM | POA: Diagnosis not present

## 2022-04-22 DIAGNOSIS — R1310 Dysphagia, unspecified: Secondary | ICD-10-CM | POA: Diagnosis not present

## 2022-04-22 DIAGNOSIS — K219 Gastro-esophageal reflux disease without esophagitis: Secondary | ICD-10-CM | POA: Diagnosis not present

## 2022-04-22 DIAGNOSIS — I4891 Unspecified atrial fibrillation: Secondary | ICD-10-CM | POA: Diagnosis not present

## 2022-04-24 DIAGNOSIS — I4891 Unspecified atrial fibrillation: Secondary | ICD-10-CM | POA: Diagnosis not present

## 2022-04-24 DIAGNOSIS — R1310 Dysphagia, unspecified: Secondary | ICD-10-CM | POA: Diagnosis not present

## 2022-04-24 DIAGNOSIS — K222 Esophageal obstruction: Secondary | ICD-10-CM | POA: Diagnosis not present

## 2022-04-24 DIAGNOSIS — K219 Gastro-esophageal reflux disease without esophagitis: Secondary | ICD-10-CM | POA: Diagnosis not present

## 2022-04-24 DIAGNOSIS — N189 Chronic kidney disease, unspecified: Secondary | ICD-10-CM | POA: Diagnosis not present

## 2022-04-24 DIAGNOSIS — I129 Hypertensive chronic kidney disease with stage 1 through stage 4 chronic kidney disease, or unspecified chronic kidney disease: Secondary | ICD-10-CM | POA: Diagnosis not present

## 2022-04-25 DIAGNOSIS — I4891 Unspecified atrial fibrillation: Secondary | ICD-10-CM | POA: Diagnosis not present

## 2022-04-25 DIAGNOSIS — N189 Chronic kidney disease, unspecified: Secondary | ICD-10-CM | POA: Diagnosis not present

## 2022-04-25 DIAGNOSIS — K222 Esophageal obstruction: Secondary | ICD-10-CM | POA: Diagnosis not present

## 2022-04-25 DIAGNOSIS — I129 Hypertensive chronic kidney disease with stage 1 through stage 4 chronic kidney disease, or unspecified chronic kidney disease: Secondary | ICD-10-CM | POA: Diagnosis not present

## 2022-04-25 DIAGNOSIS — K219 Gastro-esophageal reflux disease without esophagitis: Secondary | ICD-10-CM | POA: Diagnosis not present

## 2022-04-25 DIAGNOSIS — R1310 Dysphagia, unspecified: Secondary | ICD-10-CM | POA: Diagnosis not present

## 2022-04-27 DIAGNOSIS — M3500 Sicca syndrome, unspecified: Secondary | ICD-10-CM | POA: Diagnosis not present

## 2022-04-27 DIAGNOSIS — K219 Gastro-esophageal reflux disease without esophagitis: Secondary | ICD-10-CM | POA: Diagnosis not present

## 2022-04-27 DIAGNOSIS — R32 Unspecified urinary incontinence: Secondary | ICD-10-CM | POA: Diagnosis not present

## 2022-04-27 DIAGNOSIS — L89152 Pressure ulcer of sacral region, stage 2: Secondary | ICD-10-CM | POA: Diagnosis not present

## 2022-04-27 DIAGNOSIS — I129 Hypertensive chronic kidney disease with stage 1 through stage 4 chronic kidney disease, or unspecified chronic kidney disease: Secondary | ICD-10-CM | POA: Diagnosis not present

## 2022-04-27 DIAGNOSIS — F0392 Unspecified dementia, unspecified severity, with psychotic disturbance: Secondary | ICD-10-CM | POA: Diagnosis not present

## 2022-04-27 DIAGNOSIS — I4891 Unspecified atrial fibrillation: Secondary | ICD-10-CM | POA: Diagnosis not present

## 2022-04-27 DIAGNOSIS — R1310 Dysphagia, unspecified: Secondary | ICD-10-CM | POA: Diagnosis not present

## 2022-04-27 DIAGNOSIS — Z6820 Body mass index (BMI) 20.0-20.9, adult: Secondary | ICD-10-CM | POA: Diagnosis not present

## 2022-04-27 DIAGNOSIS — F0393 Unspecified dementia, unspecified severity, with mood disturbance: Secondary | ICD-10-CM | POA: Diagnosis not present

## 2022-04-27 DIAGNOSIS — K222 Esophageal obstruction: Secondary | ICD-10-CM | POA: Diagnosis not present

## 2022-04-27 DIAGNOSIS — N189 Chronic kidney disease, unspecified: Secondary | ICD-10-CM | POA: Diagnosis not present

## 2022-04-27 DIAGNOSIS — G2581 Restless legs syndrome: Secondary | ICD-10-CM | POA: Diagnosis not present

## 2022-04-27 DIAGNOSIS — R159 Full incontinence of feces: Secondary | ICD-10-CM | POA: Diagnosis not present

## 2022-04-29 DIAGNOSIS — N189 Chronic kidney disease, unspecified: Secondary | ICD-10-CM | POA: Diagnosis not present

## 2022-04-29 DIAGNOSIS — K222 Esophageal obstruction: Secondary | ICD-10-CM | POA: Diagnosis not present

## 2022-04-29 DIAGNOSIS — I129 Hypertensive chronic kidney disease with stage 1 through stage 4 chronic kidney disease, or unspecified chronic kidney disease: Secondary | ICD-10-CM | POA: Diagnosis not present

## 2022-04-29 DIAGNOSIS — K219 Gastro-esophageal reflux disease without esophagitis: Secondary | ICD-10-CM | POA: Diagnosis not present

## 2022-04-29 DIAGNOSIS — I4891 Unspecified atrial fibrillation: Secondary | ICD-10-CM | POA: Diagnosis not present

## 2022-04-29 DIAGNOSIS — R1310 Dysphagia, unspecified: Secondary | ICD-10-CM | POA: Diagnosis not present

## 2022-05-01 DIAGNOSIS — N189 Chronic kidney disease, unspecified: Secondary | ICD-10-CM | POA: Diagnosis not present

## 2022-05-01 DIAGNOSIS — R1310 Dysphagia, unspecified: Secondary | ICD-10-CM | POA: Diagnosis not present

## 2022-05-01 DIAGNOSIS — I129 Hypertensive chronic kidney disease with stage 1 through stage 4 chronic kidney disease, or unspecified chronic kidney disease: Secondary | ICD-10-CM | POA: Diagnosis not present

## 2022-05-01 DIAGNOSIS — K222 Esophageal obstruction: Secondary | ICD-10-CM | POA: Diagnosis not present

## 2022-05-01 DIAGNOSIS — K219 Gastro-esophageal reflux disease without esophagitis: Secondary | ICD-10-CM | POA: Diagnosis not present

## 2022-05-01 DIAGNOSIS — I4891 Unspecified atrial fibrillation: Secondary | ICD-10-CM | POA: Diagnosis not present

## 2022-05-02 DIAGNOSIS — N189 Chronic kidney disease, unspecified: Secondary | ICD-10-CM | POA: Diagnosis not present

## 2022-05-02 DIAGNOSIS — I4891 Unspecified atrial fibrillation: Secondary | ICD-10-CM | POA: Diagnosis not present

## 2022-05-02 DIAGNOSIS — K219 Gastro-esophageal reflux disease without esophagitis: Secondary | ICD-10-CM | POA: Diagnosis not present

## 2022-05-02 DIAGNOSIS — I129 Hypertensive chronic kidney disease with stage 1 through stage 4 chronic kidney disease, or unspecified chronic kidney disease: Secondary | ICD-10-CM | POA: Diagnosis not present

## 2022-05-02 DIAGNOSIS — R1310 Dysphagia, unspecified: Secondary | ICD-10-CM | POA: Diagnosis not present

## 2022-05-02 DIAGNOSIS — K222 Esophageal obstruction: Secondary | ICD-10-CM | POA: Diagnosis not present

## 2022-05-06 DIAGNOSIS — K219 Gastro-esophageal reflux disease without esophagitis: Secondary | ICD-10-CM | POA: Diagnosis not present

## 2022-05-06 DIAGNOSIS — K222 Esophageal obstruction: Secondary | ICD-10-CM | POA: Diagnosis not present

## 2022-05-06 DIAGNOSIS — I4891 Unspecified atrial fibrillation: Secondary | ICD-10-CM | POA: Diagnosis not present

## 2022-05-06 DIAGNOSIS — N189 Chronic kidney disease, unspecified: Secondary | ICD-10-CM | POA: Diagnosis not present

## 2022-05-06 DIAGNOSIS — I129 Hypertensive chronic kidney disease with stage 1 through stage 4 chronic kidney disease, or unspecified chronic kidney disease: Secondary | ICD-10-CM | POA: Diagnosis not present

## 2022-05-06 DIAGNOSIS — R1310 Dysphagia, unspecified: Secondary | ICD-10-CM | POA: Diagnosis not present

## 2022-05-07 DIAGNOSIS — N189 Chronic kidney disease, unspecified: Secondary | ICD-10-CM | POA: Diagnosis not present

## 2022-05-07 DIAGNOSIS — K222 Esophageal obstruction: Secondary | ICD-10-CM | POA: Diagnosis not present

## 2022-05-07 DIAGNOSIS — R1310 Dysphagia, unspecified: Secondary | ICD-10-CM | POA: Diagnosis not present

## 2022-05-07 DIAGNOSIS — K219 Gastro-esophageal reflux disease without esophagitis: Secondary | ICD-10-CM | POA: Diagnosis not present

## 2022-05-07 DIAGNOSIS — I4891 Unspecified atrial fibrillation: Secondary | ICD-10-CM | POA: Diagnosis not present

## 2022-05-07 DIAGNOSIS — I129 Hypertensive chronic kidney disease with stage 1 through stage 4 chronic kidney disease, or unspecified chronic kidney disease: Secondary | ICD-10-CM | POA: Diagnosis not present

## 2022-05-08 DIAGNOSIS — N189 Chronic kidney disease, unspecified: Secondary | ICD-10-CM | POA: Diagnosis not present

## 2022-05-08 DIAGNOSIS — I129 Hypertensive chronic kidney disease with stage 1 through stage 4 chronic kidney disease, or unspecified chronic kidney disease: Secondary | ICD-10-CM | POA: Diagnosis not present

## 2022-05-08 DIAGNOSIS — K222 Esophageal obstruction: Secondary | ICD-10-CM | POA: Diagnosis not present

## 2022-05-08 DIAGNOSIS — K219 Gastro-esophageal reflux disease without esophagitis: Secondary | ICD-10-CM | POA: Diagnosis not present

## 2022-05-08 DIAGNOSIS — I4891 Unspecified atrial fibrillation: Secondary | ICD-10-CM | POA: Diagnosis not present

## 2022-05-08 DIAGNOSIS — R1310 Dysphagia, unspecified: Secondary | ICD-10-CM | POA: Diagnosis not present

## 2022-05-09 DIAGNOSIS — N189 Chronic kidney disease, unspecified: Secondary | ICD-10-CM | POA: Diagnosis not present

## 2022-05-09 DIAGNOSIS — I129 Hypertensive chronic kidney disease with stage 1 through stage 4 chronic kidney disease, or unspecified chronic kidney disease: Secondary | ICD-10-CM | POA: Diagnosis not present

## 2022-05-09 DIAGNOSIS — R1310 Dysphagia, unspecified: Secondary | ICD-10-CM | POA: Diagnosis not present

## 2022-05-09 DIAGNOSIS — K219 Gastro-esophageal reflux disease without esophagitis: Secondary | ICD-10-CM | POA: Diagnosis not present

## 2022-05-09 DIAGNOSIS — K222 Esophageal obstruction: Secondary | ICD-10-CM | POA: Diagnosis not present

## 2022-05-09 DIAGNOSIS — I4891 Unspecified atrial fibrillation: Secondary | ICD-10-CM | POA: Diagnosis not present

## 2022-05-13 DIAGNOSIS — R1310 Dysphagia, unspecified: Secondary | ICD-10-CM | POA: Diagnosis not present

## 2022-05-13 DIAGNOSIS — I4891 Unspecified atrial fibrillation: Secondary | ICD-10-CM | POA: Diagnosis not present

## 2022-05-13 DIAGNOSIS — I129 Hypertensive chronic kidney disease with stage 1 through stage 4 chronic kidney disease, or unspecified chronic kidney disease: Secondary | ICD-10-CM | POA: Diagnosis not present

## 2022-05-13 DIAGNOSIS — K222 Esophageal obstruction: Secondary | ICD-10-CM | POA: Diagnosis not present

## 2022-05-13 DIAGNOSIS — K219 Gastro-esophageal reflux disease without esophagitis: Secondary | ICD-10-CM | POA: Diagnosis not present

## 2022-05-13 DIAGNOSIS — N189 Chronic kidney disease, unspecified: Secondary | ICD-10-CM | POA: Diagnosis not present

## 2022-05-15 DIAGNOSIS — K222 Esophageal obstruction: Secondary | ICD-10-CM | POA: Diagnosis not present

## 2022-05-15 DIAGNOSIS — N189 Chronic kidney disease, unspecified: Secondary | ICD-10-CM | POA: Diagnosis not present

## 2022-05-15 DIAGNOSIS — I129 Hypertensive chronic kidney disease with stage 1 through stage 4 chronic kidney disease, or unspecified chronic kidney disease: Secondary | ICD-10-CM | POA: Diagnosis not present

## 2022-05-15 DIAGNOSIS — K219 Gastro-esophageal reflux disease without esophagitis: Secondary | ICD-10-CM | POA: Diagnosis not present

## 2022-05-15 DIAGNOSIS — I4891 Unspecified atrial fibrillation: Secondary | ICD-10-CM | POA: Diagnosis not present

## 2022-05-15 DIAGNOSIS — R1310 Dysphagia, unspecified: Secondary | ICD-10-CM | POA: Diagnosis not present

## 2022-05-16 DIAGNOSIS — I129 Hypertensive chronic kidney disease with stage 1 through stage 4 chronic kidney disease, or unspecified chronic kidney disease: Secondary | ICD-10-CM | POA: Diagnosis not present

## 2022-05-16 DIAGNOSIS — K219 Gastro-esophageal reflux disease without esophagitis: Secondary | ICD-10-CM | POA: Diagnosis not present

## 2022-05-16 DIAGNOSIS — N189 Chronic kidney disease, unspecified: Secondary | ICD-10-CM | POA: Diagnosis not present

## 2022-05-16 DIAGNOSIS — I4891 Unspecified atrial fibrillation: Secondary | ICD-10-CM | POA: Diagnosis not present

## 2022-05-16 DIAGNOSIS — K222 Esophageal obstruction: Secondary | ICD-10-CM | POA: Diagnosis not present

## 2022-05-16 DIAGNOSIS — R1310 Dysphagia, unspecified: Secondary | ICD-10-CM | POA: Diagnosis not present

## 2022-05-20 DIAGNOSIS — K219 Gastro-esophageal reflux disease without esophagitis: Secondary | ICD-10-CM | POA: Diagnosis not present

## 2022-05-20 DIAGNOSIS — R1310 Dysphagia, unspecified: Secondary | ICD-10-CM | POA: Diagnosis not present

## 2022-05-20 DIAGNOSIS — N189 Chronic kidney disease, unspecified: Secondary | ICD-10-CM | POA: Diagnosis not present

## 2022-05-20 DIAGNOSIS — I129 Hypertensive chronic kidney disease with stage 1 through stage 4 chronic kidney disease, or unspecified chronic kidney disease: Secondary | ICD-10-CM | POA: Diagnosis not present

## 2022-05-20 DIAGNOSIS — I4891 Unspecified atrial fibrillation: Secondary | ICD-10-CM | POA: Diagnosis not present

## 2022-05-20 DIAGNOSIS — K222 Esophageal obstruction: Secondary | ICD-10-CM | POA: Diagnosis not present

## 2022-05-21 DIAGNOSIS — G301 Alzheimer's disease with late onset: Secondary | ICD-10-CM | POA: Diagnosis not present

## 2022-05-21 DIAGNOSIS — G2581 Restless legs syndrome: Secondary | ICD-10-CM | POA: Diagnosis not present

## 2022-05-21 DIAGNOSIS — I129 Hypertensive chronic kidney disease with stage 1 through stage 4 chronic kidney disease, or unspecified chronic kidney disease: Secondary | ICD-10-CM | POA: Diagnosis not present

## 2022-05-21 DIAGNOSIS — N1831 Chronic kidney disease, stage 3a: Secondary | ICD-10-CM | POA: Diagnosis not present

## 2022-05-21 DIAGNOSIS — D6869 Other thrombophilia: Secondary | ICD-10-CM | POA: Diagnosis not present

## 2022-05-21 DIAGNOSIS — I480 Paroxysmal atrial fibrillation: Secondary | ICD-10-CM | POA: Diagnosis not present

## 2022-05-22 DIAGNOSIS — K219 Gastro-esophageal reflux disease without esophagitis: Secondary | ICD-10-CM | POA: Diagnosis not present

## 2022-05-22 DIAGNOSIS — I4891 Unspecified atrial fibrillation: Secondary | ICD-10-CM | POA: Diagnosis not present

## 2022-05-22 DIAGNOSIS — K222 Esophageal obstruction: Secondary | ICD-10-CM | POA: Diagnosis not present

## 2022-05-22 DIAGNOSIS — I129 Hypertensive chronic kidney disease with stage 1 through stage 4 chronic kidney disease, or unspecified chronic kidney disease: Secondary | ICD-10-CM | POA: Diagnosis not present

## 2022-05-22 DIAGNOSIS — N189 Chronic kidney disease, unspecified: Secondary | ICD-10-CM | POA: Diagnosis not present

## 2022-05-22 DIAGNOSIS — R1310 Dysphagia, unspecified: Secondary | ICD-10-CM | POA: Diagnosis not present

## 2022-05-23 DIAGNOSIS — R1310 Dysphagia, unspecified: Secondary | ICD-10-CM | POA: Diagnosis not present

## 2022-05-23 DIAGNOSIS — K222 Esophageal obstruction: Secondary | ICD-10-CM | POA: Diagnosis not present

## 2022-05-23 DIAGNOSIS — K219 Gastro-esophageal reflux disease without esophagitis: Secondary | ICD-10-CM | POA: Diagnosis not present

## 2022-05-23 DIAGNOSIS — I4891 Unspecified atrial fibrillation: Secondary | ICD-10-CM | POA: Diagnosis not present

## 2022-05-23 DIAGNOSIS — N189 Chronic kidney disease, unspecified: Secondary | ICD-10-CM | POA: Diagnosis not present

## 2022-05-23 DIAGNOSIS — I129 Hypertensive chronic kidney disease with stage 1 through stage 4 chronic kidney disease, or unspecified chronic kidney disease: Secondary | ICD-10-CM | POA: Diagnosis not present

## 2022-05-27 DIAGNOSIS — I129 Hypertensive chronic kidney disease with stage 1 through stage 4 chronic kidney disease, or unspecified chronic kidney disease: Secondary | ICD-10-CM | POA: Diagnosis not present

## 2022-05-27 DIAGNOSIS — I4891 Unspecified atrial fibrillation: Secondary | ICD-10-CM | POA: Diagnosis not present

## 2022-05-27 DIAGNOSIS — R1310 Dysphagia, unspecified: Secondary | ICD-10-CM | POA: Diagnosis not present

## 2022-05-27 DIAGNOSIS — N189 Chronic kidney disease, unspecified: Secondary | ICD-10-CM | POA: Diagnosis not present

## 2022-05-27 DIAGNOSIS — K219 Gastro-esophageal reflux disease without esophagitis: Secondary | ICD-10-CM | POA: Diagnosis not present

## 2022-05-27 DIAGNOSIS — K222 Esophageal obstruction: Secondary | ICD-10-CM | POA: Diagnosis not present

## 2022-05-28 DIAGNOSIS — R1310 Dysphagia, unspecified: Secondary | ICD-10-CM | POA: Diagnosis not present

## 2022-05-28 DIAGNOSIS — N189 Chronic kidney disease, unspecified: Secondary | ICD-10-CM | POA: Diagnosis not present

## 2022-05-28 DIAGNOSIS — L89152 Pressure ulcer of sacral region, stage 2: Secondary | ICD-10-CM | POA: Diagnosis not present

## 2022-05-28 DIAGNOSIS — F0393 Unspecified dementia, unspecified severity, with mood disturbance: Secondary | ICD-10-CM | POA: Diagnosis not present

## 2022-05-28 DIAGNOSIS — G2581 Restless legs syndrome: Secondary | ICD-10-CM | POA: Diagnosis not present

## 2022-05-28 DIAGNOSIS — I4891 Unspecified atrial fibrillation: Secondary | ICD-10-CM | POA: Diagnosis not present

## 2022-05-28 DIAGNOSIS — M3500 Sicca syndrome, unspecified: Secondary | ICD-10-CM | POA: Diagnosis not present

## 2022-05-28 DIAGNOSIS — F0392 Unspecified dementia, unspecified severity, with psychotic disturbance: Secondary | ICD-10-CM | POA: Diagnosis not present

## 2022-05-28 DIAGNOSIS — K222 Esophageal obstruction: Secondary | ICD-10-CM | POA: Diagnosis not present

## 2022-05-28 DIAGNOSIS — Z741 Need for assistance with personal care: Secondary | ICD-10-CM | POA: Diagnosis not present

## 2022-05-28 DIAGNOSIS — Z6820 Body mass index (BMI) 20.0-20.9, adult: Secondary | ICD-10-CM | POA: Diagnosis not present

## 2022-05-28 DIAGNOSIS — R159 Full incontinence of feces: Secondary | ICD-10-CM | POA: Diagnosis not present

## 2022-05-28 DIAGNOSIS — I129 Hypertensive chronic kidney disease with stage 1 through stage 4 chronic kidney disease, or unspecified chronic kidney disease: Secondary | ICD-10-CM | POA: Diagnosis not present

## 2022-05-28 DIAGNOSIS — R32 Unspecified urinary incontinence: Secondary | ICD-10-CM | POA: Diagnosis not present

## 2022-05-28 DIAGNOSIS — K219 Gastro-esophageal reflux disease without esophagitis: Secondary | ICD-10-CM | POA: Diagnosis not present

## 2022-05-29 DIAGNOSIS — F0392 Unspecified dementia, unspecified severity, with psychotic disturbance: Secondary | ICD-10-CM | POA: Diagnosis not present

## 2022-05-29 DIAGNOSIS — I129 Hypertensive chronic kidney disease with stage 1 through stage 4 chronic kidney disease, or unspecified chronic kidney disease: Secondary | ICD-10-CM | POA: Diagnosis not present

## 2022-05-29 DIAGNOSIS — R1310 Dysphagia, unspecified: Secondary | ICD-10-CM | POA: Diagnosis not present

## 2022-05-29 DIAGNOSIS — N189 Chronic kidney disease, unspecified: Secondary | ICD-10-CM | POA: Diagnosis not present

## 2022-05-29 DIAGNOSIS — K222 Esophageal obstruction: Secondary | ICD-10-CM | POA: Diagnosis not present

## 2022-05-29 DIAGNOSIS — I4891 Unspecified atrial fibrillation: Secondary | ICD-10-CM | POA: Diagnosis not present

## 2022-05-30 DIAGNOSIS — N189 Chronic kidney disease, unspecified: Secondary | ICD-10-CM | POA: Diagnosis not present

## 2022-05-30 DIAGNOSIS — I4891 Unspecified atrial fibrillation: Secondary | ICD-10-CM | POA: Diagnosis not present

## 2022-05-30 DIAGNOSIS — F0392 Unspecified dementia, unspecified severity, with psychotic disturbance: Secondary | ICD-10-CM | POA: Diagnosis not present

## 2022-05-30 DIAGNOSIS — I129 Hypertensive chronic kidney disease with stage 1 through stage 4 chronic kidney disease, or unspecified chronic kidney disease: Secondary | ICD-10-CM | POA: Diagnosis not present

## 2022-05-30 DIAGNOSIS — R1310 Dysphagia, unspecified: Secondary | ICD-10-CM | POA: Diagnosis not present

## 2022-05-30 DIAGNOSIS — K222 Esophageal obstruction: Secondary | ICD-10-CM | POA: Diagnosis not present

## 2022-06-03 DIAGNOSIS — N189 Chronic kidney disease, unspecified: Secondary | ICD-10-CM | POA: Diagnosis not present

## 2022-06-03 DIAGNOSIS — F0392 Unspecified dementia, unspecified severity, with psychotic disturbance: Secondary | ICD-10-CM | POA: Diagnosis not present

## 2022-06-03 DIAGNOSIS — I4891 Unspecified atrial fibrillation: Secondary | ICD-10-CM | POA: Diagnosis not present

## 2022-06-03 DIAGNOSIS — K222 Esophageal obstruction: Secondary | ICD-10-CM | POA: Diagnosis not present

## 2022-06-03 DIAGNOSIS — R1310 Dysphagia, unspecified: Secondary | ICD-10-CM | POA: Diagnosis not present

## 2022-06-03 DIAGNOSIS — I129 Hypertensive chronic kidney disease with stage 1 through stage 4 chronic kidney disease, or unspecified chronic kidney disease: Secondary | ICD-10-CM | POA: Diagnosis not present

## 2022-06-05 DIAGNOSIS — I129 Hypertensive chronic kidney disease with stage 1 through stage 4 chronic kidney disease, or unspecified chronic kidney disease: Secondary | ICD-10-CM | POA: Diagnosis not present

## 2022-06-05 DIAGNOSIS — F0392 Unspecified dementia, unspecified severity, with psychotic disturbance: Secondary | ICD-10-CM | POA: Diagnosis not present

## 2022-06-05 DIAGNOSIS — I4891 Unspecified atrial fibrillation: Secondary | ICD-10-CM | POA: Diagnosis not present

## 2022-06-05 DIAGNOSIS — K222 Esophageal obstruction: Secondary | ICD-10-CM | POA: Diagnosis not present

## 2022-06-05 DIAGNOSIS — R1310 Dysphagia, unspecified: Secondary | ICD-10-CM | POA: Diagnosis not present

## 2022-06-05 DIAGNOSIS — N189 Chronic kidney disease, unspecified: Secondary | ICD-10-CM | POA: Diagnosis not present

## 2022-06-06 DIAGNOSIS — I4891 Unspecified atrial fibrillation: Secondary | ICD-10-CM | POA: Diagnosis not present

## 2022-06-06 DIAGNOSIS — I129 Hypertensive chronic kidney disease with stage 1 through stage 4 chronic kidney disease, or unspecified chronic kidney disease: Secondary | ICD-10-CM | POA: Diagnosis not present

## 2022-06-06 DIAGNOSIS — R1310 Dysphagia, unspecified: Secondary | ICD-10-CM | POA: Diagnosis not present

## 2022-06-06 DIAGNOSIS — F0392 Unspecified dementia, unspecified severity, with psychotic disturbance: Secondary | ICD-10-CM | POA: Diagnosis not present

## 2022-06-06 DIAGNOSIS — N189 Chronic kidney disease, unspecified: Secondary | ICD-10-CM | POA: Diagnosis not present

## 2022-06-06 DIAGNOSIS — K222 Esophageal obstruction: Secondary | ICD-10-CM | POA: Diagnosis not present

## 2022-06-10 DIAGNOSIS — I129 Hypertensive chronic kidney disease with stage 1 through stage 4 chronic kidney disease, or unspecified chronic kidney disease: Secondary | ICD-10-CM | POA: Diagnosis not present

## 2022-06-10 DIAGNOSIS — I4891 Unspecified atrial fibrillation: Secondary | ICD-10-CM | POA: Diagnosis not present

## 2022-06-10 DIAGNOSIS — F0392 Unspecified dementia, unspecified severity, with psychotic disturbance: Secondary | ICD-10-CM | POA: Diagnosis not present

## 2022-06-10 DIAGNOSIS — R1310 Dysphagia, unspecified: Secondary | ICD-10-CM | POA: Diagnosis not present

## 2022-06-10 DIAGNOSIS — K222 Esophageal obstruction: Secondary | ICD-10-CM | POA: Diagnosis not present

## 2022-06-10 DIAGNOSIS — N189 Chronic kidney disease, unspecified: Secondary | ICD-10-CM | POA: Diagnosis not present

## 2022-06-12 DIAGNOSIS — I129 Hypertensive chronic kidney disease with stage 1 through stage 4 chronic kidney disease, or unspecified chronic kidney disease: Secondary | ICD-10-CM | POA: Diagnosis not present

## 2022-06-12 DIAGNOSIS — N189 Chronic kidney disease, unspecified: Secondary | ICD-10-CM | POA: Diagnosis not present

## 2022-06-12 DIAGNOSIS — R1310 Dysphagia, unspecified: Secondary | ICD-10-CM | POA: Diagnosis not present

## 2022-06-12 DIAGNOSIS — F0392 Unspecified dementia, unspecified severity, with psychotic disturbance: Secondary | ICD-10-CM | POA: Diagnosis not present

## 2022-06-12 DIAGNOSIS — I4891 Unspecified atrial fibrillation: Secondary | ICD-10-CM | POA: Diagnosis not present

## 2022-06-12 DIAGNOSIS — K222 Esophageal obstruction: Secondary | ICD-10-CM | POA: Diagnosis not present

## 2022-06-13 DIAGNOSIS — F0392 Unspecified dementia, unspecified severity, with psychotic disturbance: Secondary | ICD-10-CM | POA: Diagnosis not present

## 2022-06-13 DIAGNOSIS — R1310 Dysphagia, unspecified: Secondary | ICD-10-CM | POA: Diagnosis not present

## 2022-06-13 DIAGNOSIS — K222 Esophageal obstruction: Secondary | ICD-10-CM | POA: Diagnosis not present

## 2022-06-13 DIAGNOSIS — I129 Hypertensive chronic kidney disease with stage 1 through stage 4 chronic kidney disease, or unspecified chronic kidney disease: Secondary | ICD-10-CM | POA: Diagnosis not present

## 2022-06-13 DIAGNOSIS — N189 Chronic kidney disease, unspecified: Secondary | ICD-10-CM | POA: Diagnosis not present

## 2022-06-13 DIAGNOSIS — I4891 Unspecified atrial fibrillation: Secondary | ICD-10-CM | POA: Diagnosis not present

## 2022-06-14 DIAGNOSIS — R1310 Dysphagia, unspecified: Secondary | ICD-10-CM | POA: Diagnosis not present

## 2022-06-14 DIAGNOSIS — K222 Esophageal obstruction: Secondary | ICD-10-CM | POA: Diagnosis not present

## 2022-06-14 DIAGNOSIS — F0392 Unspecified dementia, unspecified severity, with psychotic disturbance: Secondary | ICD-10-CM | POA: Diagnosis not present

## 2022-06-14 DIAGNOSIS — N189 Chronic kidney disease, unspecified: Secondary | ICD-10-CM | POA: Diagnosis not present

## 2022-06-14 DIAGNOSIS — I129 Hypertensive chronic kidney disease with stage 1 through stage 4 chronic kidney disease, or unspecified chronic kidney disease: Secondary | ICD-10-CM | POA: Diagnosis not present

## 2022-06-14 DIAGNOSIS — I4891 Unspecified atrial fibrillation: Secondary | ICD-10-CM | POA: Diagnosis not present

## 2022-06-17 DIAGNOSIS — K222 Esophageal obstruction: Secondary | ICD-10-CM | POA: Diagnosis not present

## 2022-06-17 DIAGNOSIS — N189 Chronic kidney disease, unspecified: Secondary | ICD-10-CM | POA: Diagnosis not present

## 2022-06-17 DIAGNOSIS — I4891 Unspecified atrial fibrillation: Secondary | ICD-10-CM | POA: Diagnosis not present

## 2022-06-17 DIAGNOSIS — R1310 Dysphagia, unspecified: Secondary | ICD-10-CM | POA: Diagnosis not present

## 2022-06-17 DIAGNOSIS — I129 Hypertensive chronic kidney disease with stage 1 through stage 4 chronic kidney disease, or unspecified chronic kidney disease: Secondary | ICD-10-CM | POA: Diagnosis not present

## 2022-06-17 DIAGNOSIS — F0392 Unspecified dementia, unspecified severity, with psychotic disturbance: Secondary | ICD-10-CM | POA: Diagnosis not present

## 2022-06-19 DIAGNOSIS — I129 Hypertensive chronic kidney disease with stage 1 through stage 4 chronic kidney disease, or unspecified chronic kidney disease: Secondary | ICD-10-CM | POA: Diagnosis not present

## 2022-06-19 DIAGNOSIS — F0392 Unspecified dementia, unspecified severity, with psychotic disturbance: Secondary | ICD-10-CM | POA: Diagnosis not present

## 2022-06-19 DIAGNOSIS — R1310 Dysphagia, unspecified: Secondary | ICD-10-CM | POA: Diagnosis not present

## 2022-06-19 DIAGNOSIS — N189 Chronic kidney disease, unspecified: Secondary | ICD-10-CM | POA: Diagnosis not present

## 2022-06-19 DIAGNOSIS — K222 Esophageal obstruction: Secondary | ICD-10-CM | POA: Diagnosis not present

## 2022-06-19 DIAGNOSIS — I4891 Unspecified atrial fibrillation: Secondary | ICD-10-CM | POA: Diagnosis not present

## 2022-06-20 DIAGNOSIS — I129 Hypertensive chronic kidney disease with stage 1 through stage 4 chronic kidney disease, or unspecified chronic kidney disease: Secondary | ICD-10-CM | POA: Diagnosis not present

## 2022-06-20 DIAGNOSIS — K222 Esophageal obstruction: Secondary | ICD-10-CM | POA: Diagnosis not present

## 2022-06-20 DIAGNOSIS — I4891 Unspecified atrial fibrillation: Secondary | ICD-10-CM | POA: Diagnosis not present

## 2022-06-20 DIAGNOSIS — N189 Chronic kidney disease, unspecified: Secondary | ICD-10-CM | POA: Diagnosis not present

## 2022-06-20 DIAGNOSIS — R1310 Dysphagia, unspecified: Secondary | ICD-10-CM | POA: Diagnosis not present

## 2022-06-20 DIAGNOSIS — F0392 Unspecified dementia, unspecified severity, with psychotic disturbance: Secondary | ICD-10-CM | POA: Diagnosis not present

## 2022-06-24 DIAGNOSIS — I4891 Unspecified atrial fibrillation: Secondary | ICD-10-CM | POA: Diagnosis not present

## 2022-06-24 DIAGNOSIS — F0392 Unspecified dementia, unspecified severity, with psychotic disturbance: Secondary | ICD-10-CM | POA: Diagnosis not present

## 2022-06-24 DIAGNOSIS — K222 Esophageal obstruction: Secondary | ICD-10-CM | POA: Diagnosis not present

## 2022-06-24 DIAGNOSIS — R1310 Dysphagia, unspecified: Secondary | ICD-10-CM | POA: Diagnosis not present

## 2022-06-24 DIAGNOSIS — N189 Chronic kidney disease, unspecified: Secondary | ICD-10-CM | POA: Diagnosis not present

## 2022-06-24 DIAGNOSIS — I129 Hypertensive chronic kidney disease with stage 1 through stage 4 chronic kidney disease, or unspecified chronic kidney disease: Secondary | ICD-10-CM | POA: Diagnosis not present

## 2022-06-26 DIAGNOSIS — I4891 Unspecified atrial fibrillation: Secondary | ICD-10-CM | POA: Diagnosis not present

## 2022-06-26 DIAGNOSIS — F0392 Unspecified dementia, unspecified severity, with psychotic disturbance: Secondary | ICD-10-CM | POA: Diagnosis not present

## 2022-06-26 DIAGNOSIS — K222 Esophageal obstruction: Secondary | ICD-10-CM | POA: Diagnosis not present

## 2022-06-26 DIAGNOSIS — I129 Hypertensive chronic kidney disease with stage 1 through stage 4 chronic kidney disease, or unspecified chronic kidney disease: Secondary | ICD-10-CM | POA: Diagnosis not present

## 2022-06-26 DIAGNOSIS — R1310 Dysphagia, unspecified: Secondary | ICD-10-CM | POA: Diagnosis not present

## 2022-06-26 DIAGNOSIS — N189 Chronic kidney disease, unspecified: Secondary | ICD-10-CM | POA: Diagnosis not present

## 2022-06-27 DIAGNOSIS — R1310 Dysphagia, unspecified: Secondary | ICD-10-CM | POA: Diagnosis not present

## 2022-06-27 DIAGNOSIS — I4891 Unspecified atrial fibrillation: Secondary | ICD-10-CM | POA: Diagnosis not present

## 2022-06-27 DIAGNOSIS — I129 Hypertensive chronic kidney disease with stage 1 through stage 4 chronic kidney disease, or unspecified chronic kidney disease: Secondary | ICD-10-CM | POA: Diagnosis not present

## 2022-06-27 DIAGNOSIS — F0392 Unspecified dementia, unspecified severity, with psychotic disturbance: Secondary | ICD-10-CM | POA: Diagnosis not present

## 2022-06-27 DIAGNOSIS — K222 Esophageal obstruction: Secondary | ICD-10-CM | POA: Diagnosis not present

## 2022-06-27 DIAGNOSIS — N189 Chronic kidney disease, unspecified: Secondary | ICD-10-CM | POA: Diagnosis not present

## 2022-06-28 DIAGNOSIS — K222 Esophageal obstruction: Secondary | ICD-10-CM | POA: Diagnosis not present

## 2022-06-28 DIAGNOSIS — Z6820 Body mass index (BMI) 20.0-20.9, adult: Secondary | ICD-10-CM | POA: Diagnosis not present

## 2022-06-28 DIAGNOSIS — R159 Full incontinence of feces: Secondary | ICD-10-CM | POA: Diagnosis not present

## 2022-06-28 DIAGNOSIS — K219 Gastro-esophageal reflux disease without esophagitis: Secondary | ICD-10-CM | POA: Diagnosis not present

## 2022-06-28 DIAGNOSIS — M3500 Sicca syndrome, unspecified: Secondary | ICD-10-CM | POA: Diagnosis not present

## 2022-06-28 DIAGNOSIS — F0393 Unspecified dementia, unspecified severity, with mood disturbance: Secondary | ICD-10-CM | POA: Diagnosis not present

## 2022-06-28 DIAGNOSIS — N189 Chronic kidney disease, unspecified: Secondary | ICD-10-CM | POA: Diagnosis not present

## 2022-06-28 DIAGNOSIS — R1310 Dysphagia, unspecified: Secondary | ICD-10-CM | POA: Diagnosis not present

## 2022-06-28 DIAGNOSIS — R32 Unspecified urinary incontinence: Secondary | ICD-10-CM | POA: Diagnosis not present

## 2022-06-28 DIAGNOSIS — Z741 Need for assistance with personal care: Secondary | ICD-10-CM | POA: Diagnosis not present

## 2022-06-28 DIAGNOSIS — L89152 Pressure ulcer of sacral region, stage 2: Secondary | ICD-10-CM | POA: Diagnosis not present

## 2022-06-28 DIAGNOSIS — I4891 Unspecified atrial fibrillation: Secondary | ICD-10-CM | POA: Diagnosis not present

## 2022-06-28 DIAGNOSIS — I129 Hypertensive chronic kidney disease with stage 1 through stage 4 chronic kidney disease, or unspecified chronic kidney disease: Secondary | ICD-10-CM | POA: Diagnosis not present

## 2022-06-28 DIAGNOSIS — G2581 Restless legs syndrome: Secondary | ICD-10-CM | POA: Diagnosis not present

## 2022-06-28 DIAGNOSIS — F0392 Unspecified dementia, unspecified severity, with psychotic disturbance: Secondary | ICD-10-CM | POA: Diagnosis not present

## 2022-07-01 DIAGNOSIS — R1310 Dysphagia, unspecified: Secondary | ICD-10-CM | POA: Diagnosis not present

## 2022-07-01 DIAGNOSIS — I4891 Unspecified atrial fibrillation: Secondary | ICD-10-CM | POA: Diagnosis not present

## 2022-07-01 DIAGNOSIS — F0392 Unspecified dementia, unspecified severity, with psychotic disturbance: Secondary | ICD-10-CM | POA: Diagnosis not present

## 2022-07-01 DIAGNOSIS — I129 Hypertensive chronic kidney disease with stage 1 through stage 4 chronic kidney disease, or unspecified chronic kidney disease: Secondary | ICD-10-CM | POA: Diagnosis not present

## 2022-07-01 DIAGNOSIS — N189 Chronic kidney disease, unspecified: Secondary | ICD-10-CM | POA: Diagnosis not present

## 2022-07-01 DIAGNOSIS — K222 Esophageal obstruction: Secondary | ICD-10-CM | POA: Diagnosis not present

## 2022-07-03 DIAGNOSIS — I129 Hypertensive chronic kidney disease with stage 1 through stage 4 chronic kidney disease, or unspecified chronic kidney disease: Secondary | ICD-10-CM | POA: Diagnosis not present

## 2022-07-03 DIAGNOSIS — I4891 Unspecified atrial fibrillation: Secondary | ICD-10-CM | POA: Diagnosis not present

## 2022-07-03 DIAGNOSIS — F0392 Unspecified dementia, unspecified severity, with psychotic disturbance: Secondary | ICD-10-CM | POA: Diagnosis not present

## 2022-07-03 DIAGNOSIS — K222 Esophageal obstruction: Secondary | ICD-10-CM | POA: Diagnosis not present

## 2022-07-03 DIAGNOSIS — R1310 Dysphagia, unspecified: Secondary | ICD-10-CM | POA: Diagnosis not present

## 2022-07-03 DIAGNOSIS — N189 Chronic kidney disease, unspecified: Secondary | ICD-10-CM | POA: Diagnosis not present

## 2022-07-04 DIAGNOSIS — K222 Esophageal obstruction: Secondary | ICD-10-CM | POA: Diagnosis not present

## 2022-07-04 DIAGNOSIS — I4891 Unspecified atrial fibrillation: Secondary | ICD-10-CM | POA: Diagnosis not present

## 2022-07-04 DIAGNOSIS — R1310 Dysphagia, unspecified: Secondary | ICD-10-CM | POA: Diagnosis not present

## 2022-07-04 DIAGNOSIS — I129 Hypertensive chronic kidney disease with stage 1 through stage 4 chronic kidney disease, or unspecified chronic kidney disease: Secondary | ICD-10-CM | POA: Diagnosis not present

## 2022-07-04 DIAGNOSIS — N189 Chronic kidney disease, unspecified: Secondary | ICD-10-CM | POA: Diagnosis not present

## 2022-07-04 DIAGNOSIS — F0392 Unspecified dementia, unspecified severity, with psychotic disturbance: Secondary | ICD-10-CM | POA: Diagnosis not present

## 2022-07-08 DIAGNOSIS — I129 Hypertensive chronic kidney disease with stage 1 through stage 4 chronic kidney disease, or unspecified chronic kidney disease: Secondary | ICD-10-CM | POA: Diagnosis not present

## 2022-07-08 DIAGNOSIS — N189 Chronic kidney disease, unspecified: Secondary | ICD-10-CM | POA: Diagnosis not present

## 2022-07-08 DIAGNOSIS — I4891 Unspecified atrial fibrillation: Secondary | ICD-10-CM | POA: Diagnosis not present

## 2022-07-08 DIAGNOSIS — F0392 Unspecified dementia, unspecified severity, with psychotic disturbance: Secondary | ICD-10-CM | POA: Diagnosis not present

## 2022-07-08 DIAGNOSIS — K222 Esophageal obstruction: Secondary | ICD-10-CM | POA: Diagnosis not present

## 2022-07-08 DIAGNOSIS — R1310 Dysphagia, unspecified: Secondary | ICD-10-CM | POA: Diagnosis not present

## 2022-07-10 DIAGNOSIS — N189 Chronic kidney disease, unspecified: Secondary | ICD-10-CM | POA: Diagnosis not present

## 2022-07-10 DIAGNOSIS — I129 Hypertensive chronic kidney disease with stage 1 through stage 4 chronic kidney disease, or unspecified chronic kidney disease: Secondary | ICD-10-CM | POA: Diagnosis not present

## 2022-07-10 DIAGNOSIS — K222 Esophageal obstruction: Secondary | ICD-10-CM | POA: Diagnosis not present

## 2022-07-10 DIAGNOSIS — F0392 Unspecified dementia, unspecified severity, with psychotic disturbance: Secondary | ICD-10-CM | POA: Diagnosis not present

## 2022-07-10 DIAGNOSIS — R1310 Dysphagia, unspecified: Secondary | ICD-10-CM | POA: Diagnosis not present

## 2022-07-10 DIAGNOSIS — I4891 Unspecified atrial fibrillation: Secondary | ICD-10-CM | POA: Diagnosis not present

## 2022-07-11 DIAGNOSIS — I129 Hypertensive chronic kidney disease with stage 1 through stage 4 chronic kidney disease, or unspecified chronic kidney disease: Secondary | ICD-10-CM | POA: Diagnosis not present

## 2022-07-11 DIAGNOSIS — R1310 Dysphagia, unspecified: Secondary | ICD-10-CM | POA: Diagnosis not present

## 2022-07-11 DIAGNOSIS — F0392 Unspecified dementia, unspecified severity, with psychotic disturbance: Secondary | ICD-10-CM | POA: Diagnosis not present

## 2022-07-11 DIAGNOSIS — N189 Chronic kidney disease, unspecified: Secondary | ICD-10-CM | POA: Diagnosis not present

## 2022-07-11 DIAGNOSIS — K222 Esophageal obstruction: Secondary | ICD-10-CM | POA: Diagnosis not present

## 2022-07-11 DIAGNOSIS — I4891 Unspecified atrial fibrillation: Secondary | ICD-10-CM | POA: Diagnosis not present

## 2022-07-15 DIAGNOSIS — F0392 Unspecified dementia, unspecified severity, with psychotic disturbance: Secondary | ICD-10-CM | POA: Diagnosis not present

## 2022-07-15 DIAGNOSIS — R1310 Dysphagia, unspecified: Secondary | ICD-10-CM | POA: Diagnosis not present

## 2022-07-15 DIAGNOSIS — N189 Chronic kidney disease, unspecified: Secondary | ICD-10-CM | POA: Diagnosis not present

## 2022-07-15 DIAGNOSIS — K222 Esophageal obstruction: Secondary | ICD-10-CM | POA: Diagnosis not present

## 2022-07-15 DIAGNOSIS — I4891 Unspecified atrial fibrillation: Secondary | ICD-10-CM | POA: Diagnosis not present

## 2022-07-15 DIAGNOSIS — I129 Hypertensive chronic kidney disease with stage 1 through stage 4 chronic kidney disease, or unspecified chronic kidney disease: Secondary | ICD-10-CM | POA: Diagnosis not present

## 2022-07-15 IMAGING — RF DG ESOPHAGUS
9 series · 14 of 24 positions shown · non-contrast
Comparison: 05/14/2010

CLINICAL DATA: Dysphagia.

EXAM:
ESOPHOGRAM/BARIUM SWALLOW
TECHNIQUE: Single contrast examination was performed using  thin barium.
FLUOROSCOPY TIME:  Fluoroscopy Time:  5 minutes and 6 seconds.
Radiation Exposure Index (if provided by the fluoroscopic device):
192 mGy
Number of Acquired Spot Images:

[Series 1: sequence · 2 of 80 frames shown (1 of 9)]
[frame 2/80]
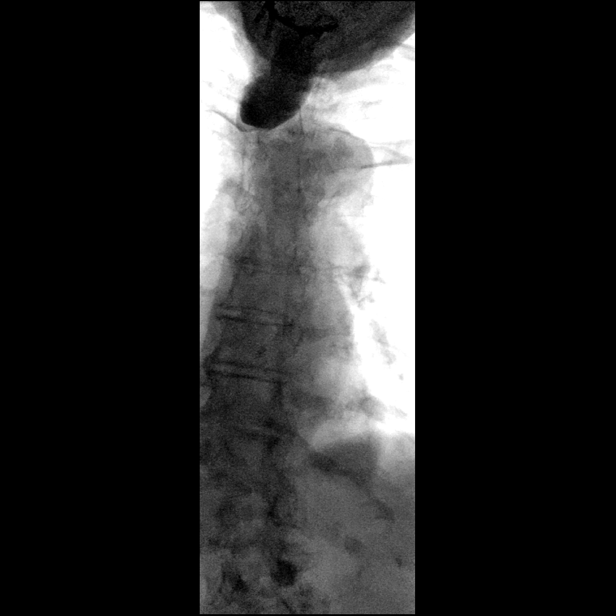
[frame 69/80]
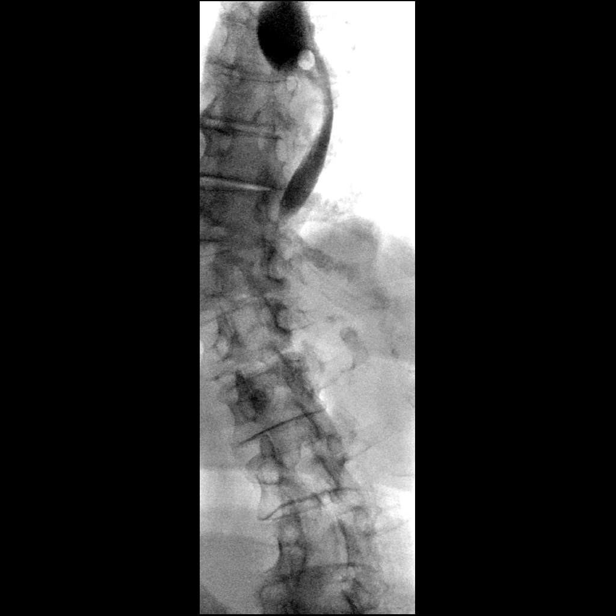

[Series 2: sequence · 1 of 205 frames shown (2 of 9)]
[frame 103/205]
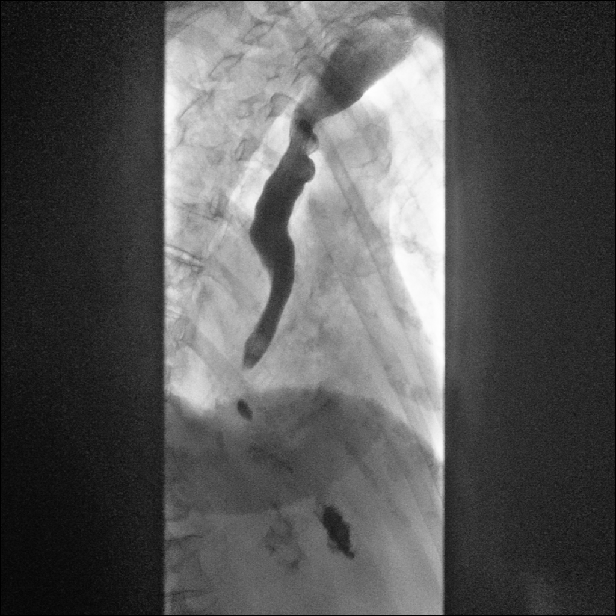

[Series 3: sequence · 2 of 189 frames shown (3 of 9)]
[frame 29/189]
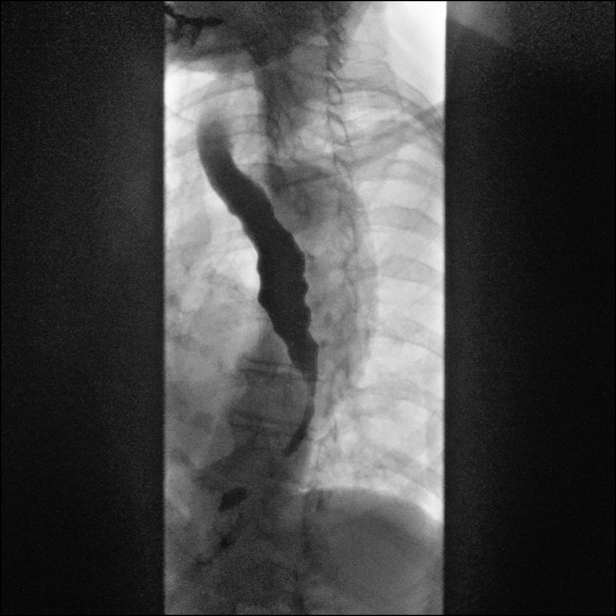
[frame 95/189]
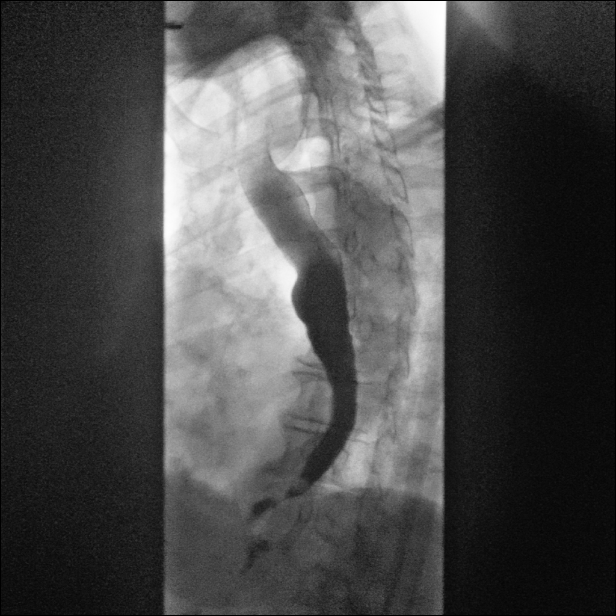

[Series 4: sequence · 1 of 118 frames shown (4 of 9)]
[frame 45/118]
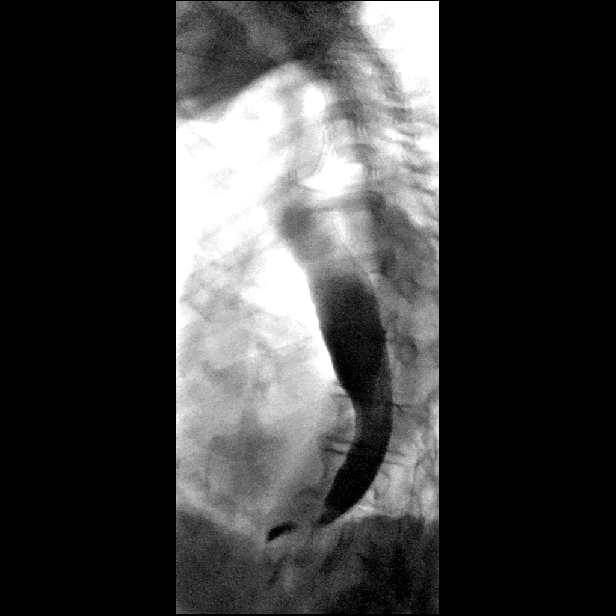

[Series 5: sequence · 2 of 98 frames shown (5 of 9)]
[frame 15/98]
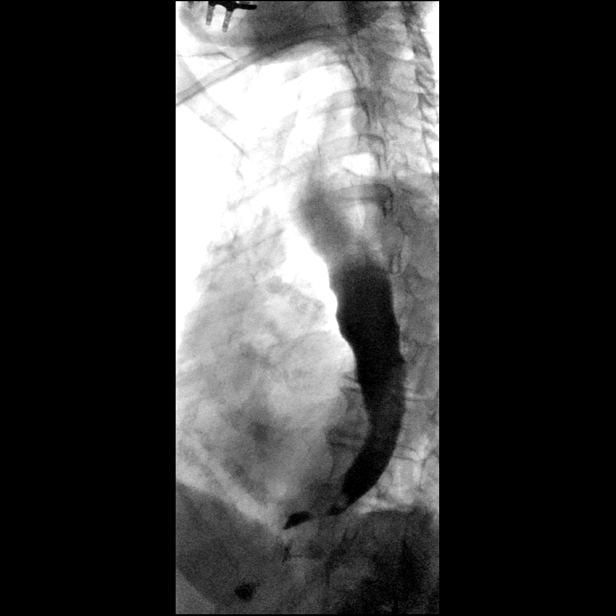
[frame 50/98]
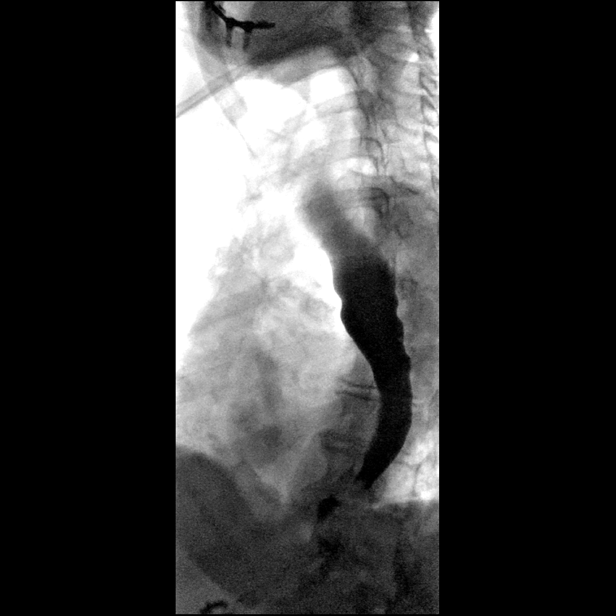

[Series 6: sequence · 0.28mm/px · 1 of 1 slices shown (6 of 9)]
[im 1/1]
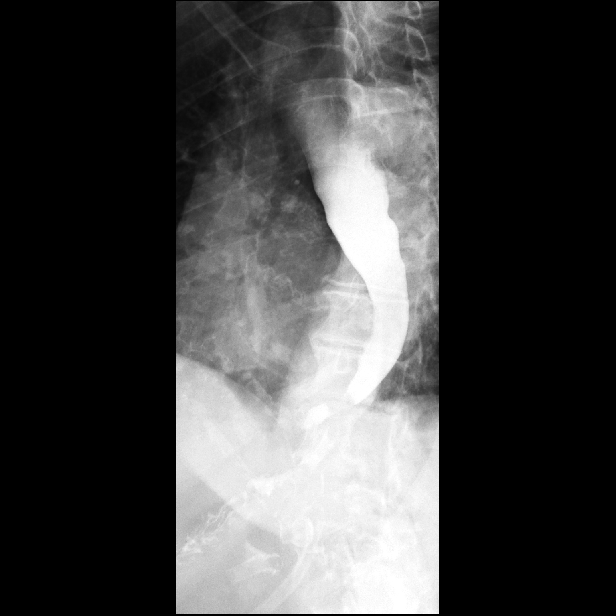

[Series 7: sequence · 1 of 126 frames shown (7 of 9)]
[frame 83/126]
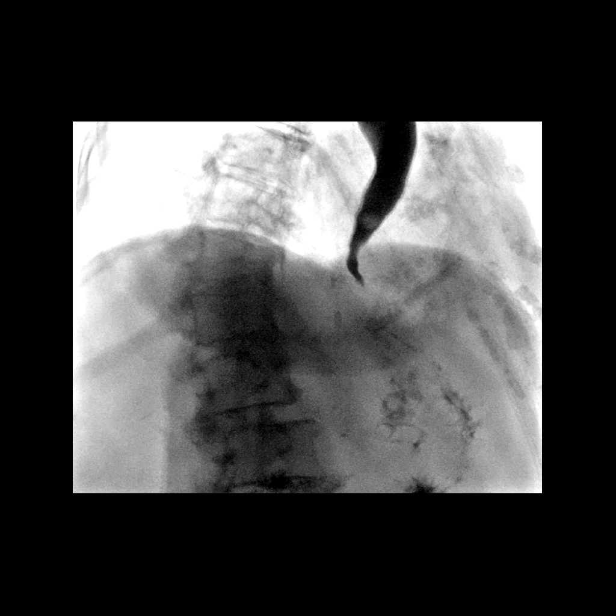

[Series 8: sequence · 2 of 24 frames shown (8 of 9)]
[frame 1/24]
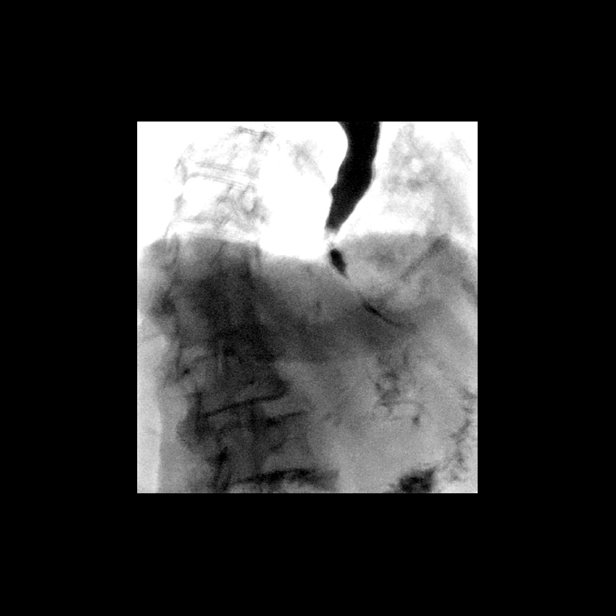
[frame 13/24]
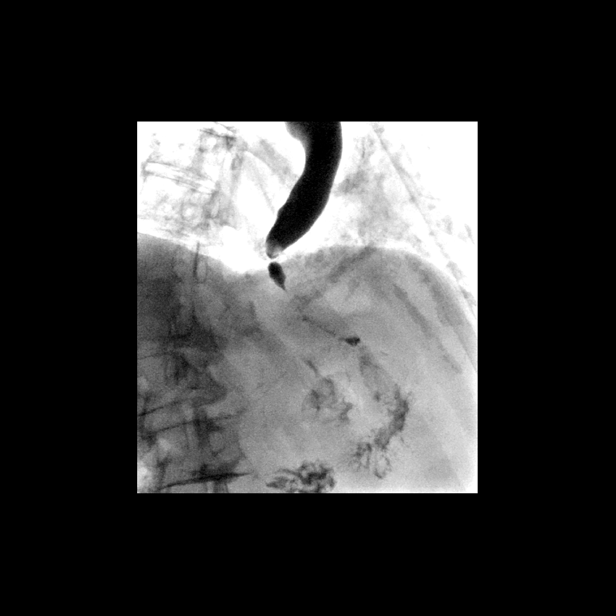

[Series 9: sequence · 2 of 24 frames shown (9 of 9)]
[frame 4/24]
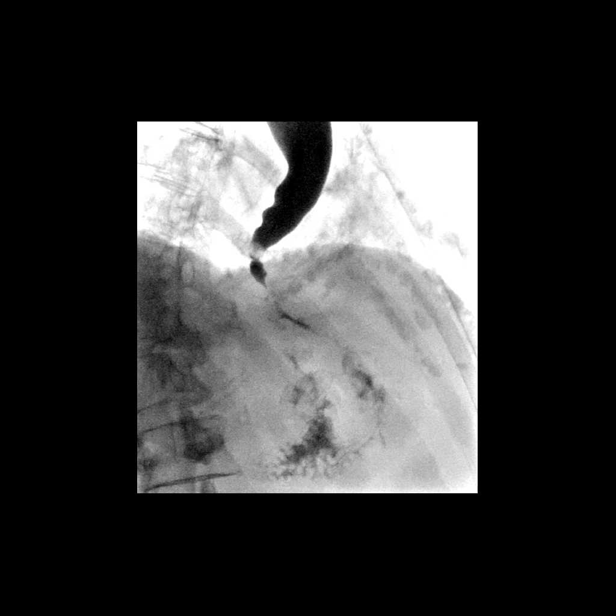
[frame 23/24]
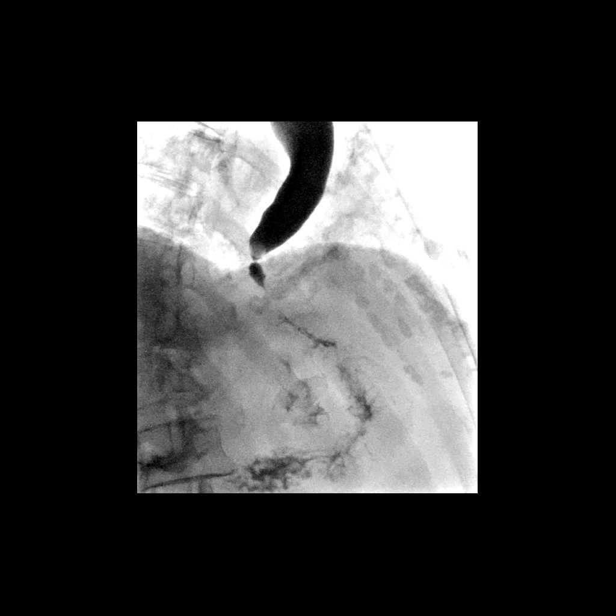

[14 of 24 positions shown; findings below may reference images not displayed]

FINDINGS: Limited study due to patient immobility and inability to stand
upright for imaging.

Patient was placed supine on the fluoro table with table elevated at
30 degree head up position. Imaging was performed in supine and both
oblique projections.

No primary peristalsis observed. Mild tertiary contractions noted
mid and distal esophagus without presbyesophagus. Patient was noted
to have a residual capsule or pill in the esophagus at the time of
the study. Several swallows of water and thin barium or given during
the study with only minimal passage of contrast material into the
stomach. Distal esophagus does not distend during the study
suggesting the presence of a tight distal esophageal stricture. A
string sign at the esophagogastric junction was observed several
times during the study with only a trace amount of the ingested
contrast ultimately entering the stomach.

Patient subsequently regurgitated the swallowed contrast and water
and the study was ended.
IMPRESSION: 1. Tight distal esophageal stricture with smooth margins in the
region of the esophagogastric junction. Study was markedly limited
by inability to position the patient upright and absence of primary
peristalsis. Within this limitation, the distal esophagus and
esophagogastric junction did not distend at all during the study
with only a trace amount of the ingested contrast material passing
into the stomach. "String sign" observed at the esophagogastric
junction.
2. Tablet or capsule identified in the esophagus at the beginning of
the study.
3. Patient regurgitated esophageal contents in the study was ended.

## 2022-07-17 DIAGNOSIS — F0392 Unspecified dementia, unspecified severity, with psychotic disturbance: Secondary | ICD-10-CM | POA: Diagnosis not present

## 2022-07-17 DIAGNOSIS — K222 Esophageal obstruction: Secondary | ICD-10-CM | POA: Diagnosis not present

## 2022-07-17 DIAGNOSIS — I4891 Unspecified atrial fibrillation: Secondary | ICD-10-CM | POA: Diagnosis not present

## 2022-07-17 DIAGNOSIS — N189 Chronic kidney disease, unspecified: Secondary | ICD-10-CM | POA: Diagnosis not present

## 2022-07-17 DIAGNOSIS — R1310 Dysphagia, unspecified: Secondary | ICD-10-CM | POA: Diagnosis not present

## 2022-07-17 DIAGNOSIS — I129 Hypertensive chronic kidney disease with stage 1 through stage 4 chronic kidney disease, or unspecified chronic kidney disease: Secondary | ICD-10-CM | POA: Diagnosis not present

## 2022-07-22 DIAGNOSIS — I4891 Unspecified atrial fibrillation: Secondary | ICD-10-CM | POA: Diagnosis not present

## 2022-07-22 DIAGNOSIS — K222 Esophageal obstruction: Secondary | ICD-10-CM | POA: Diagnosis not present

## 2022-07-22 DIAGNOSIS — F0392 Unspecified dementia, unspecified severity, with psychotic disturbance: Secondary | ICD-10-CM | POA: Diagnosis not present

## 2022-07-22 DIAGNOSIS — N189 Chronic kidney disease, unspecified: Secondary | ICD-10-CM | POA: Diagnosis not present

## 2022-07-22 DIAGNOSIS — R1310 Dysphagia, unspecified: Secondary | ICD-10-CM | POA: Diagnosis not present

## 2022-07-22 DIAGNOSIS — I129 Hypertensive chronic kidney disease with stage 1 through stage 4 chronic kidney disease, or unspecified chronic kidney disease: Secondary | ICD-10-CM | POA: Diagnosis not present

## 2022-07-24 DIAGNOSIS — N189 Chronic kidney disease, unspecified: Secondary | ICD-10-CM | POA: Diagnosis not present

## 2022-07-24 DIAGNOSIS — F0392 Unspecified dementia, unspecified severity, with psychotic disturbance: Secondary | ICD-10-CM | POA: Diagnosis not present

## 2022-07-24 DIAGNOSIS — I4891 Unspecified atrial fibrillation: Secondary | ICD-10-CM | POA: Diagnosis not present

## 2022-07-24 DIAGNOSIS — K222 Esophageal obstruction: Secondary | ICD-10-CM | POA: Diagnosis not present

## 2022-07-24 DIAGNOSIS — R1310 Dysphagia, unspecified: Secondary | ICD-10-CM | POA: Diagnosis not present

## 2022-07-24 DIAGNOSIS — I129 Hypertensive chronic kidney disease with stage 1 through stage 4 chronic kidney disease, or unspecified chronic kidney disease: Secondary | ICD-10-CM | POA: Diagnosis not present

## 2022-07-25 DIAGNOSIS — K222 Esophageal obstruction: Secondary | ICD-10-CM | POA: Diagnosis not present

## 2022-07-25 DIAGNOSIS — I129 Hypertensive chronic kidney disease with stage 1 through stage 4 chronic kidney disease, or unspecified chronic kidney disease: Secondary | ICD-10-CM | POA: Diagnosis not present

## 2022-07-25 DIAGNOSIS — I4891 Unspecified atrial fibrillation: Secondary | ICD-10-CM | POA: Diagnosis not present

## 2022-07-25 DIAGNOSIS — F0392 Unspecified dementia, unspecified severity, with psychotic disturbance: Secondary | ICD-10-CM | POA: Diagnosis not present

## 2022-07-25 DIAGNOSIS — R1310 Dysphagia, unspecified: Secondary | ICD-10-CM | POA: Diagnosis not present

## 2022-07-25 DIAGNOSIS — N189 Chronic kidney disease, unspecified: Secondary | ICD-10-CM | POA: Diagnosis not present

## 2022-07-28 DIAGNOSIS — I4891 Unspecified atrial fibrillation: Secondary | ICD-10-CM | POA: Diagnosis not present

## 2022-07-28 DIAGNOSIS — K219 Gastro-esophageal reflux disease without esophagitis: Secondary | ICD-10-CM | POA: Diagnosis not present

## 2022-07-28 DIAGNOSIS — G2581 Restless legs syndrome: Secondary | ICD-10-CM | POA: Diagnosis not present

## 2022-07-28 DIAGNOSIS — Z741 Need for assistance with personal care: Secondary | ICD-10-CM | POA: Diagnosis not present

## 2022-07-28 DIAGNOSIS — Z6820 Body mass index (BMI) 20.0-20.9, adult: Secondary | ICD-10-CM | POA: Diagnosis not present

## 2022-07-28 DIAGNOSIS — R32 Unspecified urinary incontinence: Secondary | ICD-10-CM | POA: Diagnosis not present

## 2022-07-28 DIAGNOSIS — R1310 Dysphagia, unspecified: Secondary | ICD-10-CM | POA: Diagnosis not present

## 2022-07-28 DIAGNOSIS — F0392 Unspecified dementia, unspecified severity, with psychotic disturbance: Secondary | ICD-10-CM | POA: Diagnosis not present

## 2022-07-28 DIAGNOSIS — K222 Esophageal obstruction: Secondary | ICD-10-CM | POA: Diagnosis not present

## 2022-07-28 DIAGNOSIS — I129 Hypertensive chronic kidney disease with stage 1 through stage 4 chronic kidney disease, or unspecified chronic kidney disease: Secondary | ICD-10-CM | POA: Diagnosis not present

## 2022-07-28 DIAGNOSIS — M3500 Sicca syndrome, unspecified: Secondary | ICD-10-CM | POA: Diagnosis not present

## 2022-07-28 DIAGNOSIS — R159 Full incontinence of feces: Secondary | ICD-10-CM | POA: Diagnosis not present

## 2022-07-28 DIAGNOSIS — N189 Chronic kidney disease, unspecified: Secondary | ICD-10-CM | POA: Diagnosis not present

## 2022-07-28 DIAGNOSIS — L89152 Pressure ulcer of sacral region, stage 2: Secondary | ICD-10-CM | POA: Diagnosis not present

## 2022-07-28 DIAGNOSIS — F0393 Unspecified dementia, unspecified severity, with mood disturbance: Secondary | ICD-10-CM | POA: Diagnosis not present

## 2022-07-29 DIAGNOSIS — I129 Hypertensive chronic kidney disease with stage 1 through stage 4 chronic kidney disease, or unspecified chronic kidney disease: Secondary | ICD-10-CM | POA: Diagnosis not present

## 2022-07-29 DIAGNOSIS — R1310 Dysphagia, unspecified: Secondary | ICD-10-CM | POA: Diagnosis not present

## 2022-07-29 DIAGNOSIS — I4891 Unspecified atrial fibrillation: Secondary | ICD-10-CM | POA: Diagnosis not present

## 2022-07-29 DIAGNOSIS — F0392 Unspecified dementia, unspecified severity, with psychotic disturbance: Secondary | ICD-10-CM | POA: Diagnosis not present

## 2022-07-29 DIAGNOSIS — K222 Esophageal obstruction: Secondary | ICD-10-CM | POA: Diagnosis not present

## 2022-07-29 DIAGNOSIS — N189 Chronic kidney disease, unspecified: Secondary | ICD-10-CM | POA: Diagnosis not present

## 2022-07-31 DIAGNOSIS — K222 Esophageal obstruction: Secondary | ICD-10-CM | POA: Diagnosis not present

## 2022-07-31 DIAGNOSIS — N189 Chronic kidney disease, unspecified: Secondary | ICD-10-CM | POA: Diagnosis not present

## 2022-07-31 DIAGNOSIS — F0392 Unspecified dementia, unspecified severity, with psychotic disturbance: Secondary | ICD-10-CM | POA: Diagnosis not present

## 2022-07-31 DIAGNOSIS — I4891 Unspecified atrial fibrillation: Secondary | ICD-10-CM | POA: Diagnosis not present

## 2022-07-31 DIAGNOSIS — R1310 Dysphagia, unspecified: Secondary | ICD-10-CM | POA: Diagnosis not present

## 2022-07-31 DIAGNOSIS — I129 Hypertensive chronic kidney disease with stage 1 through stage 4 chronic kidney disease, or unspecified chronic kidney disease: Secondary | ICD-10-CM | POA: Diagnosis not present

## 2022-08-02 DIAGNOSIS — N189 Chronic kidney disease, unspecified: Secondary | ICD-10-CM | POA: Diagnosis not present

## 2022-08-02 DIAGNOSIS — F0392 Unspecified dementia, unspecified severity, with psychotic disturbance: Secondary | ICD-10-CM | POA: Diagnosis not present

## 2022-08-02 DIAGNOSIS — R1310 Dysphagia, unspecified: Secondary | ICD-10-CM | POA: Diagnosis not present

## 2022-08-02 DIAGNOSIS — I4891 Unspecified atrial fibrillation: Secondary | ICD-10-CM | POA: Diagnosis not present

## 2022-08-02 DIAGNOSIS — K222 Esophageal obstruction: Secondary | ICD-10-CM | POA: Diagnosis not present

## 2022-08-02 DIAGNOSIS — I129 Hypertensive chronic kidney disease with stage 1 through stage 4 chronic kidney disease, or unspecified chronic kidney disease: Secondary | ICD-10-CM | POA: Diagnosis not present

## 2022-08-05 DIAGNOSIS — F0392 Unspecified dementia, unspecified severity, with psychotic disturbance: Secondary | ICD-10-CM | POA: Diagnosis not present

## 2022-08-05 DIAGNOSIS — K222 Esophageal obstruction: Secondary | ICD-10-CM | POA: Diagnosis not present

## 2022-08-05 DIAGNOSIS — I4891 Unspecified atrial fibrillation: Secondary | ICD-10-CM | POA: Diagnosis not present

## 2022-08-05 DIAGNOSIS — I129 Hypertensive chronic kidney disease with stage 1 through stage 4 chronic kidney disease, or unspecified chronic kidney disease: Secondary | ICD-10-CM | POA: Diagnosis not present

## 2022-08-05 DIAGNOSIS — N189 Chronic kidney disease, unspecified: Secondary | ICD-10-CM | POA: Diagnosis not present

## 2022-08-05 DIAGNOSIS — R1310 Dysphagia, unspecified: Secondary | ICD-10-CM | POA: Diagnosis not present

## 2022-08-07 DIAGNOSIS — I4891 Unspecified atrial fibrillation: Secondary | ICD-10-CM | POA: Diagnosis not present

## 2022-08-07 DIAGNOSIS — N189 Chronic kidney disease, unspecified: Secondary | ICD-10-CM | POA: Diagnosis not present

## 2022-08-07 DIAGNOSIS — I129 Hypertensive chronic kidney disease with stage 1 through stage 4 chronic kidney disease, or unspecified chronic kidney disease: Secondary | ICD-10-CM | POA: Diagnosis not present

## 2022-08-07 DIAGNOSIS — R1310 Dysphagia, unspecified: Secondary | ICD-10-CM | POA: Diagnosis not present

## 2022-08-07 DIAGNOSIS — F0392 Unspecified dementia, unspecified severity, with psychotic disturbance: Secondary | ICD-10-CM | POA: Diagnosis not present

## 2022-08-07 DIAGNOSIS — K222 Esophageal obstruction: Secondary | ICD-10-CM | POA: Diagnosis not present

## 2022-08-08 DIAGNOSIS — N189 Chronic kidney disease, unspecified: Secondary | ICD-10-CM | POA: Diagnosis not present

## 2022-08-08 DIAGNOSIS — F0392 Unspecified dementia, unspecified severity, with psychotic disturbance: Secondary | ICD-10-CM | POA: Diagnosis not present

## 2022-08-08 DIAGNOSIS — I129 Hypertensive chronic kidney disease with stage 1 through stage 4 chronic kidney disease, or unspecified chronic kidney disease: Secondary | ICD-10-CM | POA: Diagnosis not present

## 2022-08-08 DIAGNOSIS — R1310 Dysphagia, unspecified: Secondary | ICD-10-CM | POA: Diagnosis not present

## 2022-08-08 DIAGNOSIS — K222 Esophageal obstruction: Secondary | ICD-10-CM | POA: Diagnosis not present

## 2022-08-08 DIAGNOSIS — I4891 Unspecified atrial fibrillation: Secondary | ICD-10-CM | POA: Diagnosis not present

## 2022-08-12 DIAGNOSIS — I4891 Unspecified atrial fibrillation: Secondary | ICD-10-CM | POA: Diagnosis not present

## 2022-08-12 DIAGNOSIS — R1310 Dysphagia, unspecified: Secondary | ICD-10-CM | POA: Diagnosis not present

## 2022-08-12 DIAGNOSIS — I129 Hypertensive chronic kidney disease with stage 1 through stage 4 chronic kidney disease, or unspecified chronic kidney disease: Secondary | ICD-10-CM | POA: Diagnosis not present

## 2022-08-12 DIAGNOSIS — N189 Chronic kidney disease, unspecified: Secondary | ICD-10-CM | POA: Diagnosis not present

## 2022-08-12 DIAGNOSIS — K222 Esophageal obstruction: Secondary | ICD-10-CM | POA: Diagnosis not present

## 2022-08-12 DIAGNOSIS — F0392 Unspecified dementia, unspecified severity, with psychotic disturbance: Secondary | ICD-10-CM | POA: Diagnosis not present

## 2022-08-14 DIAGNOSIS — I129 Hypertensive chronic kidney disease with stage 1 through stage 4 chronic kidney disease, or unspecified chronic kidney disease: Secondary | ICD-10-CM | POA: Diagnosis not present

## 2022-08-14 DIAGNOSIS — F0392 Unspecified dementia, unspecified severity, with psychotic disturbance: Secondary | ICD-10-CM | POA: Diagnosis not present

## 2022-08-14 DIAGNOSIS — R1310 Dysphagia, unspecified: Secondary | ICD-10-CM | POA: Diagnosis not present

## 2022-08-14 DIAGNOSIS — N189 Chronic kidney disease, unspecified: Secondary | ICD-10-CM | POA: Diagnosis not present

## 2022-08-14 DIAGNOSIS — K222 Esophageal obstruction: Secondary | ICD-10-CM | POA: Diagnosis not present

## 2022-08-14 DIAGNOSIS — I4891 Unspecified atrial fibrillation: Secondary | ICD-10-CM | POA: Diagnosis not present

## 2022-08-19 DIAGNOSIS — R1310 Dysphagia, unspecified: Secondary | ICD-10-CM | POA: Diagnosis not present

## 2022-08-19 DIAGNOSIS — F0392 Unspecified dementia, unspecified severity, with psychotic disturbance: Secondary | ICD-10-CM | POA: Diagnosis not present

## 2022-08-19 DIAGNOSIS — I129 Hypertensive chronic kidney disease with stage 1 through stage 4 chronic kidney disease, or unspecified chronic kidney disease: Secondary | ICD-10-CM | POA: Diagnosis not present

## 2022-08-19 DIAGNOSIS — N189 Chronic kidney disease, unspecified: Secondary | ICD-10-CM | POA: Diagnosis not present

## 2022-08-19 DIAGNOSIS — K222 Esophageal obstruction: Secondary | ICD-10-CM | POA: Diagnosis not present

## 2022-08-19 DIAGNOSIS — I4891 Unspecified atrial fibrillation: Secondary | ICD-10-CM | POA: Diagnosis not present

## 2022-08-20 DIAGNOSIS — R1310 Dysphagia, unspecified: Secondary | ICD-10-CM | POA: Diagnosis not present

## 2022-08-20 DIAGNOSIS — N189 Chronic kidney disease, unspecified: Secondary | ICD-10-CM | POA: Diagnosis not present

## 2022-08-20 DIAGNOSIS — I129 Hypertensive chronic kidney disease with stage 1 through stage 4 chronic kidney disease, or unspecified chronic kidney disease: Secondary | ICD-10-CM | POA: Diagnosis not present

## 2022-08-20 DIAGNOSIS — F0392 Unspecified dementia, unspecified severity, with psychotic disturbance: Secondary | ICD-10-CM | POA: Diagnosis not present

## 2022-08-20 DIAGNOSIS — K222 Esophageal obstruction: Secondary | ICD-10-CM | POA: Diagnosis not present

## 2022-08-20 DIAGNOSIS — I4891 Unspecified atrial fibrillation: Secondary | ICD-10-CM | POA: Diagnosis not present

## 2022-08-21 DIAGNOSIS — N189 Chronic kidney disease, unspecified: Secondary | ICD-10-CM | POA: Diagnosis not present

## 2022-08-21 DIAGNOSIS — R1310 Dysphagia, unspecified: Secondary | ICD-10-CM | POA: Diagnosis not present

## 2022-08-21 DIAGNOSIS — I4891 Unspecified atrial fibrillation: Secondary | ICD-10-CM | POA: Diagnosis not present

## 2022-08-21 DIAGNOSIS — F0392 Unspecified dementia, unspecified severity, with psychotic disturbance: Secondary | ICD-10-CM | POA: Diagnosis not present

## 2022-08-21 DIAGNOSIS — K222 Esophageal obstruction: Secondary | ICD-10-CM | POA: Diagnosis not present

## 2022-08-21 DIAGNOSIS — I129 Hypertensive chronic kidney disease with stage 1 through stage 4 chronic kidney disease, or unspecified chronic kidney disease: Secondary | ICD-10-CM | POA: Diagnosis not present

## 2022-08-22 DIAGNOSIS — K222 Esophageal obstruction: Secondary | ICD-10-CM | POA: Diagnosis not present

## 2022-08-22 DIAGNOSIS — F0392 Unspecified dementia, unspecified severity, with psychotic disturbance: Secondary | ICD-10-CM | POA: Diagnosis not present

## 2022-08-22 DIAGNOSIS — I129 Hypertensive chronic kidney disease with stage 1 through stage 4 chronic kidney disease, or unspecified chronic kidney disease: Secondary | ICD-10-CM | POA: Diagnosis not present

## 2022-08-22 DIAGNOSIS — R1310 Dysphagia, unspecified: Secondary | ICD-10-CM | POA: Diagnosis not present

## 2022-08-22 DIAGNOSIS — I4891 Unspecified atrial fibrillation: Secondary | ICD-10-CM | POA: Diagnosis not present

## 2022-08-22 DIAGNOSIS — N189 Chronic kidney disease, unspecified: Secondary | ICD-10-CM | POA: Diagnosis not present

## 2022-08-23 DIAGNOSIS — I4891 Unspecified atrial fibrillation: Secondary | ICD-10-CM | POA: Diagnosis not present

## 2022-08-23 DIAGNOSIS — R1310 Dysphagia, unspecified: Secondary | ICD-10-CM | POA: Diagnosis not present

## 2022-08-23 DIAGNOSIS — K222 Esophageal obstruction: Secondary | ICD-10-CM | POA: Diagnosis not present

## 2022-08-23 DIAGNOSIS — N189 Chronic kidney disease, unspecified: Secondary | ICD-10-CM | POA: Diagnosis not present

## 2022-08-23 DIAGNOSIS — F0392 Unspecified dementia, unspecified severity, with psychotic disturbance: Secondary | ICD-10-CM | POA: Diagnosis not present

## 2022-08-23 DIAGNOSIS — I129 Hypertensive chronic kidney disease with stage 1 through stage 4 chronic kidney disease, or unspecified chronic kidney disease: Secondary | ICD-10-CM | POA: Diagnosis not present

## 2022-08-24 DIAGNOSIS — F0392 Unspecified dementia, unspecified severity, with psychotic disturbance: Secondary | ICD-10-CM | POA: Diagnosis not present

## 2022-08-24 DIAGNOSIS — K222 Esophageal obstruction: Secondary | ICD-10-CM | POA: Diagnosis not present

## 2022-08-24 DIAGNOSIS — N189 Chronic kidney disease, unspecified: Secondary | ICD-10-CM | POA: Diagnosis not present

## 2022-08-24 DIAGNOSIS — I129 Hypertensive chronic kidney disease with stage 1 through stage 4 chronic kidney disease, or unspecified chronic kidney disease: Secondary | ICD-10-CM | POA: Diagnosis not present

## 2022-08-24 DIAGNOSIS — R1310 Dysphagia, unspecified: Secondary | ICD-10-CM | POA: Diagnosis not present

## 2022-08-24 DIAGNOSIS — I4891 Unspecified atrial fibrillation: Secondary | ICD-10-CM | POA: Diagnosis not present

## 2022-08-26 DIAGNOSIS — I4891 Unspecified atrial fibrillation: Secondary | ICD-10-CM | POA: Diagnosis not present

## 2022-08-26 DIAGNOSIS — R1310 Dysphagia, unspecified: Secondary | ICD-10-CM | POA: Diagnosis not present

## 2022-08-26 DIAGNOSIS — N189 Chronic kidney disease, unspecified: Secondary | ICD-10-CM | POA: Diagnosis not present

## 2022-08-26 DIAGNOSIS — F0392 Unspecified dementia, unspecified severity, with psychotic disturbance: Secondary | ICD-10-CM | POA: Diagnosis not present

## 2022-08-26 DIAGNOSIS — K222 Esophageal obstruction: Secondary | ICD-10-CM | POA: Diagnosis not present

## 2022-08-26 DIAGNOSIS — I129 Hypertensive chronic kidney disease with stage 1 through stage 4 chronic kidney disease, or unspecified chronic kidney disease: Secondary | ICD-10-CM | POA: Diagnosis not present

## 2022-08-27 DIAGNOSIS — R1310 Dysphagia, unspecified: Secondary | ICD-10-CM | POA: Diagnosis not present

## 2022-08-27 DIAGNOSIS — F0392 Unspecified dementia, unspecified severity, with psychotic disturbance: Secondary | ICD-10-CM | POA: Diagnosis not present

## 2022-08-27 DIAGNOSIS — K222 Esophageal obstruction: Secondary | ICD-10-CM | POA: Diagnosis not present

## 2022-08-27 DIAGNOSIS — N189 Chronic kidney disease, unspecified: Secondary | ICD-10-CM | POA: Diagnosis not present

## 2022-08-27 DIAGNOSIS — I129 Hypertensive chronic kidney disease with stage 1 through stage 4 chronic kidney disease, or unspecified chronic kidney disease: Secondary | ICD-10-CM | POA: Diagnosis not present

## 2022-08-27 DIAGNOSIS — I4891 Unspecified atrial fibrillation: Secondary | ICD-10-CM | POA: Diagnosis not present

## 2022-08-28 DIAGNOSIS — R159 Full incontinence of feces: Secondary | ICD-10-CM | POA: Diagnosis not present

## 2022-08-28 DIAGNOSIS — F0392 Unspecified dementia, unspecified severity, with psychotic disturbance: Secondary | ICD-10-CM | POA: Diagnosis not present

## 2022-08-28 DIAGNOSIS — I4891 Unspecified atrial fibrillation: Secondary | ICD-10-CM | POA: Diagnosis not present

## 2022-08-28 DIAGNOSIS — M3500 Sicca syndrome, unspecified: Secondary | ICD-10-CM | POA: Diagnosis not present

## 2022-08-28 DIAGNOSIS — I129 Hypertensive chronic kidney disease with stage 1 through stage 4 chronic kidney disease, or unspecified chronic kidney disease: Secondary | ICD-10-CM | POA: Diagnosis not present

## 2022-08-28 DIAGNOSIS — F0393 Unspecified dementia, unspecified severity, with mood disturbance: Secondary | ICD-10-CM | POA: Diagnosis not present

## 2022-08-28 DIAGNOSIS — R32 Unspecified urinary incontinence: Secondary | ICD-10-CM | POA: Diagnosis not present

## 2022-08-28 DIAGNOSIS — K222 Esophageal obstruction: Secondary | ICD-10-CM | POA: Diagnosis not present

## 2022-08-28 DIAGNOSIS — R1310 Dysphagia, unspecified: Secondary | ICD-10-CM | POA: Diagnosis not present

## 2022-08-28 DIAGNOSIS — K219 Gastro-esophageal reflux disease without esophagitis: Secondary | ICD-10-CM | POA: Diagnosis not present

## 2022-08-28 DIAGNOSIS — Z6820 Body mass index (BMI) 20.0-20.9, adult: Secondary | ICD-10-CM | POA: Diagnosis not present

## 2022-08-28 DIAGNOSIS — L89152 Pressure ulcer of sacral region, stage 2: Secondary | ICD-10-CM | POA: Diagnosis not present

## 2022-08-28 DIAGNOSIS — Z741 Need for assistance with personal care: Secondary | ICD-10-CM | POA: Diagnosis not present

## 2022-08-28 DIAGNOSIS — G2581 Restless legs syndrome: Secondary | ICD-10-CM | POA: Diagnosis not present

## 2022-08-28 DIAGNOSIS — N189 Chronic kidney disease, unspecified: Secondary | ICD-10-CM | POA: Diagnosis not present

## 2022-09-27 DEATH — deceased
